# Patient Record
Sex: Female | Born: 1935 | Race: White | Hispanic: No | Smoking: Never smoker
Health system: Southern US, Community
[De-identification: ages and names within clinical notes are randomized; demographics above are authoritative.]

## PROBLEM LIST (undated history)

## (undated) DIAGNOSIS — I10 Essential (primary) hypertension: Secondary | ICD-10-CM

## (undated) DIAGNOSIS — K219 Gastro-esophageal reflux disease without esophagitis: Secondary | ICD-10-CM

## (undated) HISTORY — PX: APPENDECTOMY: SHX54

## (undated) HISTORY — DX: Gastro-esophageal reflux disease without esophagitis: K21.9

## (undated) HISTORY — PX: TONSILLECTOMY: SUR1361

## (undated) HISTORY — DX: Essential (primary) hypertension: I10

## (undated) HISTORY — PX: VAGINAL HYSTERECTOMY: SUR661

---

## 1998-04-06 ENCOUNTER — Ambulatory Visit (HOSPITAL_COMMUNITY): Admission: RE | Admit: 1998-04-06 | Discharge: 1998-04-06 | Payer: Self-pay | Admitting: Family Medicine

## 1999-04-13 ENCOUNTER — Other Ambulatory Visit: Admission: RE | Admit: 1999-04-13 | Discharge: 1999-04-13 | Payer: Self-pay | Admitting: Family Medicine

## 1999-06-07 ENCOUNTER — Other Ambulatory Visit: Admission: RE | Admit: 1999-06-07 | Discharge: 1999-06-07 | Payer: Self-pay | Admitting: Family Medicine

## 1999-09-24 ENCOUNTER — Encounter: Payer: Self-pay | Admitting: Family Medicine

## 1999-09-24 ENCOUNTER — Encounter: Admission: RE | Admit: 1999-09-24 | Discharge: 1999-09-24 | Payer: Self-pay | Admitting: Family Medicine

## 2000-02-14 ENCOUNTER — Encounter: Payer: Self-pay | Admitting: Family Medicine

## 2000-02-14 ENCOUNTER — Ambulatory Visit (HOSPITAL_COMMUNITY): Admission: RE | Admit: 2000-02-14 | Discharge: 2000-02-14 | Payer: Self-pay | Admitting: Family Medicine

## 2000-08-12 HISTORY — PX: OTHER SURGICAL HISTORY: SHX169

## 2000-09-25 ENCOUNTER — Encounter: Payer: Self-pay | Admitting: Family Medicine

## 2000-09-25 ENCOUNTER — Encounter: Admission: RE | Admit: 2000-09-25 | Discharge: 2000-09-25 | Payer: Self-pay | Admitting: Family Medicine

## 2001-07-14 ENCOUNTER — Ambulatory Visit (HOSPITAL_BASED_OUTPATIENT_CLINIC_OR_DEPARTMENT_OTHER): Admission: RE | Admit: 2001-07-14 | Discharge: 2001-07-14 | Payer: Self-pay | Admitting: Orthopedic Surgery

## 2001-10-05 ENCOUNTER — Encounter: Payer: Self-pay | Admitting: Family Medicine

## 2001-10-05 ENCOUNTER — Encounter: Admission: RE | Admit: 2001-10-05 | Discharge: 2001-10-05 | Payer: Self-pay | Admitting: Family Medicine

## 2002-06-03 ENCOUNTER — Other Ambulatory Visit: Admission: RE | Admit: 2002-06-03 | Discharge: 2002-06-03 | Payer: Self-pay | Admitting: Family Medicine

## 2002-07-19 ENCOUNTER — Encounter: Payer: Self-pay | Admitting: Orthopedic Surgery

## 2002-07-19 ENCOUNTER — Encounter: Admission: RE | Admit: 2002-07-19 | Discharge: 2002-07-19 | Payer: Self-pay | Admitting: Orthopedic Surgery

## 2002-07-20 ENCOUNTER — Ambulatory Visit (HOSPITAL_BASED_OUTPATIENT_CLINIC_OR_DEPARTMENT_OTHER): Admission: RE | Admit: 2002-07-20 | Discharge: 2002-07-20 | Payer: Self-pay | Admitting: Orthopedic Surgery

## 2002-09-29 ENCOUNTER — Ambulatory Visit (HOSPITAL_COMMUNITY): Admission: RE | Admit: 2002-09-29 | Discharge: 2002-09-29 | Payer: Self-pay | Admitting: Ophthalmology

## 2002-10-13 ENCOUNTER — Ambulatory Visit (HOSPITAL_COMMUNITY): Admission: RE | Admit: 2002-10-13 | Discharge: 2002-10-13 | Payer: Self-pay | Admitting: Ophthalmology

## 2003-03-30 ENCOUNTER — Encounter: Admission: RE | Admit: 2003-03-30 | Discharge: 2003-03-30 | Payer: Self-pay | Admitting: Family Medicine

## 2003-03-30 ENCOUNTER — Encounter: Payer: Self-pay | Admitting: Family Medicine

## 2004-05-24 ENCOUNTER — Ambulatory Visit (HOSPITAL_COMMUNITY): Admission: RE | Admit: 2004-05-24 | Discharge: 2004-05-24 | Payer: Self-pay | Admitting: Family Medicine

## 2005-04-01 ENCOUNTER — Encounter: Admission: RE | Admit: 2005-04-01 | Discharge: 2005-04-01 | Payer: Self-pay | Admitting: Orthopedic Surgery

## 2005-04-02 ENCOUNTER — Ambulatory Visit (HOSPITAL_COMMUNITY): Admission: RE | Admit: 2005-04-02 | Discharge: 2005-04-02 | Payer: Self-pay | Admitting: Orthopedic Surgery

## 2005-04-02 ENCOUNTER — Ambulatory Visit (HOSPITAL_BASED_OUTPATIENT_CLINIC_OR_DEPARTMENT_OTHER): Admission: RE | Admit: 2005-04-02 | Discharge: 2005-04-02 | Payer: Self-pay | Admitting: Orthopedic Surgery

## 2005-07-25 ENCOUNTER — Ambulatory Visit (HOSPITAL_COMMUNITY): Admission: RE | Admit: 2005-07-25 | Discharge: 2005-07-25 | Payer: Self-pay | Admitting: Family Medicine

## 2006-09-05 ENCOUNTER — Ambulatory Visit (HOSPITAL_COMMUNITY): Admission: RE | Admit: 2006-09-05 | Discharge: 2006-09-05 | Payer: Self-pay | Admitting: Family Medicine

## 2007-10-01 ENCOUNTER — Ambulatory Visit (HOSPITAL_COMMUNITY): Admission: RE | Admit: 2007-10-01 | Discharge: 2007-10-01 | Payer: Self-pay | Admitting: Family Medicine

## 2009-03-22 ENCOUNTER — Encounter: Admission: RE | Admit: 2009-03-22 | Discharge: 2009-03-22 | Payer: Self-pay | Admitting: Family Medicine

## 2010-12-28 NOTE — Op Note (Signed)
NAMEPRESTON, Michelle Jones                ACCOUNT NO.:  [id number]   MEDICAL RECORD NO.:  [id number]          PATIENT TYPE:  AMB   LOCATION:  DSC                          FACILITY:  [facility]   PHYSICIAN:  Leonides Grills, M.D.     DATE OF BIRTH:  1930/01/05   DATE OF PROCEDURE:  04/02/2005  DATE OF DISCHARGE:                                 OPERATIVE REPORT   PREOPERATIVE DIAGNOSES:  1.  Right second metatarsalgia.  2.  Right second hammertoe.   POSTOPERATIVE DIAGNOSES:  1.  Right second metatarsalgia.  2.  Right second hammertoe.   OPERATION:  1.  Right second Weil metatarsal shortening osteotomy.  2.  Stress x-ray right foot.  3.  Right second toe MTP joint dorsal capsulotomies with collateral release.  4.  Right second toe proximal phalanx head resection.  5.  Right second toe EDB to EDL tendon transfer.   ANESTHESIA:  General.   SURGEON:  Leonides Grills, M.D.   ASSISTANT:  Lianne Cure, P.A.   ESTIMATED BLOOD LOSS:  Minimal.   TOURNIQUET TIME:  Approximately 40 minutes.   COMPLICATIONS:  None.   DISPOSITION:  Stable to PR.   INDICATION:  This is a 77 year old pleasant female who has had longstanding  right forefoot pain that was interfering with her life to the point she  could not do what she wanted to do.  She was consented for the above  procedure.  All risks which include infection, nerve or vessel injury,  nonunion, malunion, hard tissue, heart failure, cockup toe deformity,  persistent pain, worsening pain, prolonged recovery, all were explained.  Questions were encouraged and answered.   DESCRIPTION OF PROCEDURE:  The patient was brought to the operating room and  placed in supine position after adequate general endotracheal anesthesia was  administered as well as Ancef 1 g IV piggyback.  The right lower extremity  was then prepped and draped in sterile manner over a proximally placed thigh  tourniquet.  Limb was gravity-exsanguinated and tourniquet elevated to  290  mmHg.  A longitudinal incision over the dorsal aspect of the right second  toe was then made.  Dissection was carried down through skin.  Hemostasis  was obtained.  Extensor digitorum longus and extensor digitorum brevis  tendons were identified, and the longus was tenotomized proximal medial and  right brevis distal lateral and retracted out of harms way for later  transfer.  The MTP joint was then dissected out, and with a 15 blade scalpel  with an assistant retracting the neurovascular structures on either side.  The capsule was then released dorsally as well as collaterals with 15 blade  scalpel.  This side actually released a tight capsule.  We then skeletonized  the distal aspect of the proximal phalanx.  Head was then dissected out, and  the head was then removed with a rongeur followed by a bone cutter.  It was  found at this point that we did not require an FDL to proximal phalanx  transfer.  We then went back to the MTP joint and plantar-flexed the toe and  with stacked saw blades performed the Weil osteotomy parallel to the plantar  aspect of the foot, protecting the soft tissue both medially and laterally.  Once this was done, head was translated approximately 3-4 mm proximally, and  then fixed with a 12-mm Synthes Mini Frag screw using 2.5, 1.5-mm drill  holes respectively.  This had excellent purchase.  Stress x-rays were obtain  in the AP and lateral plane and showed excellent __________ as well as no  gross motion across the osteotomy site.  We then copiously irrigated the  area and joints with normal saline.  We then back to the PIP joint and  placed a 0.045 K-wire antegrade across the middle and distal phalanx,  reduced the PIP joint and then fired this retrograde into the proximal  phalanx.  MTP joint was then held into proper position, and the K-wire was  then advanced across the MTP joint into the metatarsal head.  This had  excellent maintenance of the corrected  position, and did not change the  position of the Weil osteotomy.  The joint and area were copiously irrigated  with normal saline.  We then performed an EDB to EDL tendon transfer with 3-  0 PDS suture. This had an outstanding transfer and its proper tension as  well.  Tourniquet was deflated.  Hemostasis was obtained.  Skin-relieving  incisions were made on either side of the K-wire.  K-wire was bent, cut, and  capped.  Toe picked up nicely.  Wound was closed with 4-0 nylon suture.  Sterile dressing was applied.  Hard sole shoe was applied.  The patient was  stable to the PR.      Leonides Grills, M.D.  Electronically Signed     PB/MEDQ  D:  04/02/2005  T:  04/02/2005  Job:  [id number]

## 2010-12-28 NOTE — Op Note (Signed)
[facility]  Patient:    Michelle Jones, Michelle Jones Visit Number: [id number] MRN: [id number]          Service Type: Attending:  Katy Fitch. Naaman Plummer., M.D. Dictated by:   Katy Fitch Naaman Plummer., M.D. Proc. Date: 07/14/01                             Operative Report  PREOPERATIVE DIAGNOSIS: 1. Bilateral carpal tunnel syndrome. 2. Chronic stenosing tenosynovitis, left first dorsal compartment,    unresponsive to splinting, injection and activity modification.  POSTOPERATIVE DIAGNOSIS: 1. Bilateral carpal tunnel syndrome. 2. Chronic stenosing tenosynovitis, left first dorsal compartment,    unresponsive to splinting, injection and activity modification.  OPERATION PERFORMED: 1. Release of left transverse carpal ligament. 2. Through separate incision release of left first dorsal compartment    and tenosynovectomy of abductor pollicis longus tendons.  OPERATING SURGEON:  Josephine Igo, M.D.  ASSISTANT:  Annye Rusk, P.A-C.  ANESTHESIA:  General by LMA.  SUPERVISING ANESTHESIOLOGIST:  Dr. Zoila Shutter.  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  She did not respond to steroid injection and splinting for the first dorsal compartment and had chronic numbness in a median distribution bilaterally. She was referred to see Dr. Wadie Lessen for detailed electrocautery diagnostic studies on July 13, 2001 and was noted to have rather severe bilateral carpal tunnel syndrome.  After informed consent she requested that we try to care for both hands prior to the year end.  She is brought to the operating room at this time for release of her left transverse carpal ligament and release of her left first dorsal compartment.  DESCRIPTION OF PROCEDURE:   Michelle Jones was brought to the operating room and placed in supine position on the operating table.  Following induction of general anesthesia by LMA, the left arm was prepped with Betadine soap and solution and sterilely draped.  Following exsanguination of the limb with an Esmarch bandage, the arterial tourniquet was inflated to 220 mHg.  The procedure commenced with a short incision in line of the ring finger in the palm.  The subcutaneous tissues were carefully divided revealing the palmar fascia.  This was split longitudinally to reveal the common sensory branch of the median nerve.  These were followed back to the transverse carpal ligament which was carefully separated from the median nerve.  The ligament was released on its ulnar border extending into the distal forearm.  This widely opened the carpal canal.  No masses or other predicaments were noted.  Bleeding points along the margin of the released ligament were electrocauterized with bipolar current followed by repair of the skin with intradermal 3-0 Prolene suture.  Attention was then directed to the first dorsal compartment.  A short transverse incision was fashioned directly over the palpably enlarged compartment.  Subcutaneous tissues were carefully divided taking care to clear inflammatory tissues off the compartment and gently elevate the radial sensory branches and maintain them in a safe position with the use of blunt right angle retractors.  The first dorsal compartment was split with a scalpel and extended proximally and distally with scissors.  There was noticed to be two slips of the abductor pollicis longus invested in a very inflamed tenosynovium.  The extensor pollicis brevis  had a very small distal compartment that was released.  It did not have significant tenosynovitis.  A small rongeur was used to clear the tenosynovium off of the abductor pollicis longus slips.  Thereafter, free range of motion of the  thumb metacarpal at the [facility] joint and wrists was covered in radial and ulnar deviation.  The wound was inspected for bleeding points and subsequently repaired with intradermal 3-0 Prolene.  Steri-Strips were applied to the wounds followed by compressive dressing with a volar splint maintaining the wrist in 5 degrees dorsiflexion.  For aftercare Ms. Crouse is given a prescription for Percocet 5 mg one to two tablets p.o. q.4-6h. p.r.n. pain.  She will return to our office in seven to 10 days for dressing change and initiation of her therapy program. Dictated by:   Katy Fitch. Naaman Plummer., M.D. Attending:  Katy Fitch. Naaman Plummer., M.D. DD:  07/14/01 TD:  07/14/01 Job: [id number]

## 2010-12-28 NOTE — Op Note (Signed)
NAMEBABE, Michelle Jones                          ACCOUNT NO.:  [id number]   MEDICAL RECORD NO.:  [id number]                   PATIENT TYPE:  AMB   LOCATION:  DSC                                  FACILITY:  [facility]   PHYSICIAN:  Katy Fitch. Naaman Plummer., M.D.          DATE OF BIRTH:  1930-05-16   DATE OF PROCEDURE:  07/20/2002  DATE OF DISCHARGE:                                 OPERATIVE REPORT   PREOPERATIVE DIAGNOSES:  1. Chronic entrapment neuropathy, right median nerve at carpal tunnel.  2. Stenosing tenosynovitis, right thumb.  3. Stenosing tenosynovitis, right ring finger.   PROCEDURES:  1. Release of right transverse carpal ligament, 64721-RT.  2. Release of right thumb A1 pulley, [id number].  3. Release of right ring finger A1 pulley, [phone].   SURGEON:  Katy Fitch. Sypher, M.D.   ASSISTANT:  Jonni Sanger, P.A.   ANESTHESIA:  General by LMA supervised by the anesthesiologist, Janetta Hora.  Gelene Mink, M.D.   INDICATIONS:  The patient is a 77 year old well-known patient who has had a  history of bilateral carpal tunnel syndrome and stenosing tenosynovitis.  She is status post left carpal tunnel release and is quite satisfied with  the results.   She returned with problems including triggering of her thumb and ring  finger.   Due to a failure to respond to nonoperative measures, she is brought to the  operating room at this time.   DESCRIPTION OF PROCEDURE:  The patient is brought to the operating room and  placed in the supine position upon the operating table.   Following induction of anesthesia by LMA, the right arm is prepped with  Betadine soap and solution and sterilely draped.   Following exsanguination of the limb with an Esmarch bandage, an arterial  tourniquet on the proximal brachium is inflated to 220 mmHg.   The procedure commenced with a short incision in the line of the ring finger  in the palm.  Subcutaneous tissues were carefully divided to  reveal the  palmar fascia.  This was split longitudinally to reveal the common sensory  branch of the median nerve.   These were followed back to the transverse carpal ligament, which was  carefully isolated from the median nerve.   The ligament was released along its ulnar border, extending into the distal  forearm.   This widely opened the carpal canal.   No masses or other predicaments were noted.   Bleeding points along the margin of the released ligament were  electrocauterized with bipolar current, followed by repair of the skin with  intradermal 3-0 Prolene suture.   Attention then directed to the thumb.   The A1 pulley was exposed through a short transverse incision.  Subcutaneous  tissues were carefully divided with scissors, taking care to gently retract  the radial proper digital nerve with a blunt retractor.  A Glorious Peach was used to  clear inflammatory tissues off  the A1 pulley.  Once the pulley was isolated,  it was split with a scalpel and scissors.  The flexor pollicis longus tendon  was delivered and found to have one small area of necrosis.  This was  debrided with a rongeur.  The tendon was replaced in the sheath and the  wound repaired with intradermal 3-0 Prolene suture.   Thereafter a full range of motion of the IP joint was recovered passively.   This wound as then repaired with intradermal 3-0 Prolene suture.   A third incision was then fashioned over the A1 pulley of the ring finger  distal to the distal palmar crease.  Subcutaneous tissues were carefully to  divided to reveal the palmar fascia.  The pretendinous fibers were released  with scissors, followed by isolation of the A1 pulley.  The pulley was split  with scissors and the flexor tendons delivered.  A nodule of tenosynovium on  the superficialis tendon was removed with a rongeur.  Thereafter free range  of motion of the finger was recovered.   The tendons were replaced in the retinacular sheath,  followed by repair of  the wound with intradermal 3-0 Prolene suture.   The wounds were then infiltrated with 0.25% Marcaine for postoperative  analgesia, followed by dressing with Xeroflo, sterile gauze, and sterile  Webril, followed by a volar plaster splint maintaining the wrist in 10  degrees of dorsiflexion.                                               Katy Fitch Naaman Plummer., M.D.    RVS/MEDQ  D:  07/20/2002  T:  07/20/2002  Job:  [id number]

## 2011-08-20 DIAGNOSIS — H04129 Dry eye syndrome of unspecified lacrimal gland: Secondary | ICD-10-CM | POA: Diagnosis not present

## 2011-08-20 DIAGNOSIS — Z961 Presence of intraocular lens: Secondary | ICD-10-CM | POA: Diagnosis not present

## 2012-01-21 DIAGNOSIS — I10 Essential (primary) hypertension: Secondary | ICD-10-CM | POA: Diagnosis not present

## 2012-01-21 DIAGNOSIS — F3289 Other specified depressive episodes: Secondary | ICD-10-CM | POA: Diagnosis not present

## 2012-01-21 DIAGNOSIS — F329 Major depressive disorder, single episode, unspecified: Secondary | ICD-10-CM | POA: Diagnosis not present

## 2012-01-21 DIAGNOSIS — Z780 Asymptomatic menopausal state: Secondary | ICD-10-CM | POA: Diagnosis not present

## 2012-01-21 DIAGNOSIS — E039 Hypothyroidism, unspecified: Secondary | ICD-10-CM | POA: Diagnosis not present

## 2012-01-21 DIAGNOSIS — K219 Gastro-esophageal reflux disease without esophagitis: Secondary | ICD-10-CM | POA: Diagnosis not present

## 2012-01-21 DIAGNOSIS — E785 Hyperlipidemia, unspecified: Secondary | ICD-10-CM | POA: Diagnosis not present

## 2012-01-21 DIAGNOSIS — M1990 Unspecified osteoarthritis, unspecified site: Secondary | ICD-10-CM | POA: Diagnosis not present

## 2012-02-06 DIAGNOSIS — Z803 Family history of malignant neoplasm of breast: Secondary | ICD-10-CM | POA: Diagnosis not present

## 2012-02-06 DIAGNOSIS — Z1231 Encounter for screening mammogram for malignant neoplasm of breast: Secondary | ICD-10-CM | POA: Diagnosis not present

## 2012-07-23 DIAGNOSIS — F3289 Other specified depressive episodes: Secondary | ICD-10-CM | POA: Diagnosis not present

## 2012-07-23 DIAGNOSIS — N183 Chronic kidney disease, stage 3 unspecified: Secondary | ICD-10-CM | POA: Diagnosis not present

## 2012-07-23 DIAGNOSIS — I10 Essential (primary) hypertension: Secondary | ICD-10-CM | POA: Diagnosis not present

## 2012-07-23 DIAGNOSIS — E039 Hypothyroidism, unspecified: Secondary | ICD-10-CM | POA: Diagnosis not present

## 2012-07-23 DIAGNOSIS — N951 Menopausal and female climacteric states: Secondary | ICD-10-CM | POA: Diagnosis not present

## 2012-07-23 DIAGNOSIS — E785 Hyperlipidemia, unspecified: Secondary | ICD-10-CM | POA: Diagnosis not present

## 2012-07-23 DIAGNOSIS — F329 Major depressive disorder, single episode, unspecified: Secondary | ICD-10-CM | POA: Diagnosis not present

## 2012-08-28 DIAGNOSIS — H18419 Arcus senilis, unspecified eye: Secondary | ICD-10-CM | POA: Diagnosis not present

## 2012-08-28 DIAGNOSIS — H26499 Other secondary cataract, unspecified eye: Secondary | ICD-10-CM | POA: Diagnosis not present

## 2012-08-28 DIAGNOSIS — I10 Essential (primary) hypertension: Secondary | ICD-10-CM | POA: Diagnosis not present

## 2012-08-28 DIAGNOSIS — H04129 Dry eye syndrome of unspecified lacrimal gland: Secondary | ICD-10-CM | POA: Diagnosis not present

## 2012-08-28 DIAGNOSIS — Z961 Presence of intraocular lens: Secondary | ICD-10-CM | POA: Diagnosis not present

## 2012-10-14 DIAGNOSIS — M5430 Sciatica, unspecified side: Secondary | ICD-10-CM | POA: Diagnosis not present

## 2012-10-14 DIAGNOSIS — M25569 Pain in unspecified knee: Secondary | ICD-10-CM | POA: Diagnosis not present

## 2012-10-28 DIAGNOSIS — M5430 Sciatica, unspecified side: Secondary | ICD-10-CM | POA: Diagnosis not present

## 2012-10-28 DIAGNOSIS — IMO0002 Reserved for concepts with insufficient information to code with codable children: Secondary | ICD-10-CM | POA: Diagnosis not present

## 2012-11-25 DIAGNOSIS — N951 Menopausal and female climacteric states: Secondary | ICD-10-CM | POA: Diagnosis not present

## 2012-11-30 DIAGNOSIS — M5430 Sciatica, unspecified side: Secondary | ICD-10-CM | POA: Diagnosis not present

## 2012-11-30 DIAGNOSIS — IMO0002 Reserved for concepts with insufficient information to code with codable children: Secondary | ICD-10-CM | POA: Diagnosis not present

## 2012-11-30 DIAGNOSIS — M1710 Unilateral primary osteoarthritis, unspecified knee: Secondary | ICD-10-CM | POA: Diagnosis not present

## 2013-01-20 DIAGNOSIS — Z0000 Encounter for general adult medical examination without abnormal findings: Secondary | ICD-10-CM | POA: Diagnosis not present

## 2013-01-20 DIAGNOSIS — I10 Essential (primary) hypertension: Secondary | ICD-10-CM | POA: Diagnosis not present

## 2013-01-20 DIAGNOSIS — K219 Gastro-esophageal reflux disease without esophagitis: Secondary | ICD-10-CM | POA: Diagnosis not present

## 2013-01-20 DIAGNOSIS — E039 Hypothyroidism, unspecified: Secondary | ICD-10-CM | POA: Diagnosis not present

## 2013-01-20 DIAGNOSIS — E785 Hyperlipidemia, unspecified: Secondary | ICD-10-CM | POA: Diagnosis not present

## 2013-01-20 DIAGNOSIS — F3289 Other specified depressive episodes: Secondary | ICD-10-CM | POA: Diagnosis not present

## 2013-01-20 DIAGNOSIS — Z780 Asymptomatic menopausal state: Secondary | ICD-10-CM | POA: Diagnosis not present

## 2013-01-20 DIAGNOSIS — F329 Major depressive disorder, single episode, unspecified: Secondary | ICD-10-CM | POA: Diagnosis not present

## 2013-03-04 DIAGNOSIS — J209 Acute bronchitis, unspecified: Secondary | ICD-10-CM | POA: Diagnosis not present

## 2013-03-29 DIAGNOSIS — R609 Edema, unspecified: Secondary | ICD-10-CM | POA: Diagnosis not present

## 2013-04-20 DIAGNOSIS — R609 Edema, unspecified: Secondary | ICD-10-CM | POA: Diagnosis not present

## 2013-06-18 DIAGNOSIS — Z1231 Encounter for screening mammogram for malignant neoplasm of breast: Secondary | ICD-10-CM | POA: Diagnosis not present

## 2013-07-22 DIAGNOSIS — K219 Gastro-esophageal reflux disease without esophagitis: Secondary | ICD-10-CM | POA: Diagnosis not present

## 2013-07-22 DIAGNOSIS — E785 Hyperlipidemia, unspecified: Secondary | ICD-10-CM | POA: Diagnosis not present

## 2013-07-22 DIAGNOSIS — I10 Essential (primary) hypertension: Secondary | ICD-10-CM | POA: Diagnosis not present

## 2013-07-22 DIAGNOSIS — R609 Edema, unspecified: Secondary | ICD-10-CM | POA: Diagnosis not present

## 2013-07-22 DIAGNOSIS — IMO0002 Reserved for concepts with insufficient information to code with codable children: Secondary | ICD-10-CM | POA: Diagnosis not present

## 2013-08-18 DIAGNOSIS — R109 Unspecified abdominal pain: Secondary | ICD-10-CM | POA: Diagnosis not present

## 2013-09-17 DIAGNOSIS — H26499 Other secondary cataract, unspecified eye: Secondary | ICD-10-CM | POA: Diagnosis not present

## 2013-09-17 DIAGNOSIS — Z961 Presence of intraocular lens: Secondary | ICD-10-CM | POA: Diagnosis not present

## 2013-09-17 DIAGNOSIS — H02839 Dermatochalasis of unspecified eye, unspecified eyelid: Secondary | ICD-10-CM | POA: Diagnosis not present

## 2013-09-17 DIAGNOSIS — H18419 Arcus senilis, unspecified eye: Secondary | ICD-10-CM | POA: Diagnosis not present

## 2014-01-24 DIAGNOSIS — Z0000 Encounter for general adult medical examination without abnormal findings: Secondary | ICD-10-CM | POA: Diagnosis not present

## 2014-01-24 DIAGNOSIS — M79609 Pain in unspecified limb: Secondary | ICD-10-CM | POA: Diagnosis not present

## 2014-01-24 DIAGNOSIS — IMO0002 Reserved for concepts with insufficient information to code with codable children: Secondary | ICD-10-CM | POA: Diagnosis not present

## 2014-01-24 DIAGNOSIS — I10 Essential (primary) hypertension: Secondary | ICD-10-CM | POA: Diagnosis not present

## 2014-01-24 DIAGNOSIS — K219 Gastro-esophageal reflux disease without esophagitis: Secondary | ICD-10-CM | POA: Diagnosis not present

## 2014-01-24 DIAGNOSIS — R609 Edema, unspecified: Secondary | ICD-10-CM | POA: Diagnosis not present

## 2014-01-24 DIAGNOSIS — E785 Hyperlipidemia, unspecified: Secondary | ICD-10-CM | POA: Diagnosis not present

## 2014-07-27 DIAGNOSIS — R609 Edema, unspecified: Secondary | ICD-10-CM | POA: Diagnosis not present

## 2014-07-27 DIAGNOSIS — F325 Major depressive disorder, single episode, in full remission: Secondary | ICD-10-CM | POA: Diagnosis not present

## 2014-07-27 DIAGNOSIS — N183 Chronic kidney disease, stage 3 (moderate): Secondary | ICD-10-CM | POA: Diagnosis not present

## 2014-07-27 DIAGNOSIS — G250 Essential tremor: Secondary | ICD-10-CM | POA: Diagnosis not present

## 2014-07-27 DIAGNOSIS — I129 Hypertensive chronic kidney disease with stage 1 through stage 4 chronic kidney disease, or unspecified chronic kidney disease: Secondary | ICD-10-CM | POA: Diagnosis not present

## 2014-07-27 DIAGNOSIS — K219 Gastro-esophageal reflux disease without esophagitis: Secondary | ICD-10-CM | POA: Diagnosis not present

## 2014-07-27 DIAGNOSIS — Z23 Encounter for immunization: Secondary | ICD-10-CM | POA: Diagnosis not present

## 2014-07-27 DIAGNOSIS — E039 Hypothyroidism, unspecified: Secondary | ICD-10-CM | POA: Diagnosis not present

## 2014-07-27 DIAGNOSIS — E782 Mixed hyperlipidemia: Secondary | ICD-10-CM | POA: Diagnosis not present

## 2014-08-18 DIAGNOSIS — M79609 Pain in unspecified limb: Secondary | ICD-10-CM | POA: Diagnosis not present

## 2014-09-14 ENCOUNTER — Other Ambulatory Visit: Payer: Self-pay | Admitting: Family Medicine

## 2014-09-14 ENCOUNTER — Ambulatory Visit
Admission: RE | Admit: 2014-09-14 | Discharge: 2014-09-14 | Disposition: A | Discharge status: Home / Self Care | Payer: Medicare Other | Source: Ambulatory Visit | Attending: Family Medicine | Admitting: Family Medicine

## 2014-09-14 DIAGNOSIS — M25552 Pain in left hip: Secondary | ICD-10-CM | POA: Diagnosis not present

## 2014-09-14 DIAGNOSIS — L03115 Cellulitis of right lower limb: Secondary | ICD-10-CM | POA: Diagnosis not present

## 2014-09-14 DIAGNOSIS — S79912A Unspecified injury of left hip, initial encounter: Secondary | ICD-10-CM | POA: Diagnosis not present

## 2014-09-20 ENCOUNTER — Other Ambulatory Visit: Payer: Self-pay | Admitting: Family Medicine

## 2014-09-20 DIAGNOSIS — S72002A Fracture of unspecified part of neck of left femur, initial encounter for closed fracture: Secondary | ICD-10-CM

## 2014-09-22 ENCOUNTER — Ambulatory Visit
Admission: RE | Admit: 2014-09-22 | Discharge: 2014-09-22 | Disposition: A | Discharge status: Home / Self Care | Payer: Medicare Other | Source: Ambulatory Visit | Attending: Family Medicine | Admitting: Family Medicine

## 2014-09-22 DIAGNOSIS — S76012A Strain of muscle, fascia and tendon of left hip, initial encounter: Secondary | ICD-10-CM | POA: Diagnosis not present

## 2014-09-22 DIAGNOSIS — S72002A Fracture of unspecified part of neck of left femur, initial encounter for closed fracture: Secondary | ICD-10-CM

## 2014-09-22 DIAGNOSIS — M7062 Trochanteric bursitis, left hip: Secondary | ICD-10-CM | POA: Diagnosis not present

## 2014-10-03 ENCOUNTER — Other Ambulatory Visit: Payer: Self-pay | Admitting: Family Medicine

## 2014-10-03 DIAGNOSIS — R1900 Intra-abdominal and pelvic swelling, mass and lump, unspecified site: Secondary | ICD-10-CM

## 2014-10-04 DIAGNOSIS — H04123 Dry eye syndrome of bilateral lacrimal glands: Secondary | ICD-10-CM | POA: Diagnosis not present

## 2014-10-04 DIAGNOSIS — Z961 Presence of intraocular lens: Secondary | ICD-10-CM | POA: Diagnosis not present

## 2014-10-04 DIAGNOSIS — H18413 Arcus senilis, bilateral: Secondary | ICD-10-CM | POA: Diagnosis not present

## 2014-10-07 ENCOUNTER — Ambulatory Visit
Admission: RE | Admit: 2014-10-07 | Discharge: 2014-10-07 | Disposition: A | Discharge status: Home / Self Care | Payer: Medicare Other | Source: Ambulatory Visit | Attending: Family Medicine | Admitting: Family Medicine

## 2014-10-07 DIAGNOSIS — R1900 Intra-abdominal and pelvic swelling, mass and lump, unspecified site: Secondary | ICD-10-CM

## 2014-10-07 DIAGNOSIS — N9489 Other specified conditions associated with female genital organs and menstrual cycle: Secondary | ICD-10-CM | POA: Diagnosis not present

## 2014-10-07 MED ORDER — IOHEXOL 300 MG/ML  SOLN
100.0000 mL | Freq: Once | INTRAMUSCULAR | Status: AC | PRN
  Administered 2014-10-07: 100 mL via INTRAVENOUS

## 2014-10-17 DIAGNOSIS — R1900 Intra-abdominal and pelvic swelling, mass and lump, unspecified site: Secondary | ICD-10-CM | POA: Diagnosis not present

## 2014-10-24 ENCOUNTER — Ambulatory Visit: Payer: Medicare Other | Attending: Gynecologic Oncology | Admitting: Gynecologic Oncology

## 2014-10-24 ENCOUNTER — Other Ambulatory Visit: Payer: Medicare Other

## 2014-10-24 ENCOUNTER — Encounter: Payer: Self-pay | Admitting: Gynecologic Oncology

## 2014-10-24 VITALS — BP 114/46 | HR 73 | Temp 98.1°F | Resp 16 | Ht 63.0 in | Wt 165.1 lb

## 2014-10-24 DIAGNOSIS — Z880 Allergy status to penicillin: Secondary | ICD-10-CM | POA: Diagnosis not present

## 2014-10-24 DIAGNOSIS — Z833 Family history of diabetes mellitus: Secondary | ICD-10-CM | POA: Diagnosis not present

## 2014-10-24 DIAGNOSIS — R1909 Other intra-abdominal and pelvic swelling, mass and lump: Secondary | ICD-10-CM

## 2014-10-24 DIAGNOSIS — K219 Gastro-esophageal reflux disease without esophagitis: Secondary | ICD-10-CM | POA: Diagnosis not present

## 2014-10-24 DIAGNOSIS — Z90710 Acquired absence of both cervix and uterus: Secondary | ICD-10-CM | POA: Diagnosis not present

## 2014-10-24 DIAGNOSIS — N83209 Unspecified ovarian cyst, unspecified side: Secondary | ICD-10-CM

## 2014-10-24 DIAGNOSIS — Z803 Family history of malignant neoplasm of breast: Secondary | ICD-10-CM | POA: Diagnosis not present

## 2014-10-24 DIAGNOSIS — Z808 Family history of malignant neoplasm of other organs or systems: Secondary | ICD-10-CM | POA: Diagnosis not present

## 2014-10-24 DIAGNOSIS — I10 Essential (primary) hypertension: Secondary | ICD-10-CM | POA: Insufficient documentation

## 2014-10-24 DIAGNOSIS — R1900 Intra-abdominal and pelvic swelling, mass and lump, unspecified site: Secondary | ICD-10-CM | POA: Insufficient documentation

## 2014-10-24 DIAGNOSIS — Z79899 Other long term (current) drug therapy: Secondary | ICD-10-CM | POA: Insufficient documentation

## 2014-10-24 NOTE — Progress Notes (Signed)
Consult Note: Gyn-Onc  Consult was requested by Dr. Landry Mellow for the evaluation of Michelle Jones 81 y.o. female with a cystic pelvic mass at vaginal cuff seen on imaging.  CC:  Chief Complaint  Patient presents with  . pelvic mass    Assessment/Plan:  Michelle Jones  is a 81 y.o.  year old with what is most likely a benign pelvic mass s/p remote hx of hysterectomy and possible BSO.  I performed a history, physical examination, and personally reviewed the patient's imaging films including the CT and MRI from February, 2016.   I informed the patient and her daughter that I have a low suspicion that this cystic mass is cancer or continuing to her symptoms of hip pain. We will draw a CA-125 today and if low this is very reassuring. I discussed the low specificity of CA-125 and the fact that if it is elevated does not mean that this cyst is malignant. However we would be more inclined to pursue surgical excision.  I offer the patient to options for therapy. Option #1 is to pursue laparoscopic exploration possible BSO (if she has remaining ovaries and tubes in place), frozen section of the mass and staging for malignancy is found. Option #2 is to enter surveillance of the mass with transvaginal ultrasounds at 3 and 6 months followed by spacing of exams if stability is noted. I'm offering this conservative approach this patient as she is 81 years of age, the imaging is not particularly worrisome for malignancy, and these are not symptomatic. I discussed operative risks with the patient including  bleeding, infection, damage to internal organs (such as bladder,ureters, bowels), blood clot, reoperation and rehospitalization. I discussed that obesity and advanced age increase her risk for a complication postoperatively.  The patient is electing for the conservative approach of interval ultrasounds. We have scheduled an ultrasound in 3 months time to evaluate character 6 of the cyst and monitor for  stability.   HPI: Michelle Jones is a 81 year old woman who had the detection of a 6 cm cystic midline pelvic mass on MRI and CT scan performed for left hip pain that commenced approximately 2 months ago after a fall. She has a remote history of a vaginal hysterectomy for abnormal uterine bleeding when she was in her 42s. She was placed on Premarin orally postoperatively. She does not know definitively if ovaries were taken at that surgery.  On 02/20/2015 she underwent an MRI of the hip without contrast to evaluate the left hip and groin for pain following a fall injuring her 2016. The MRI detected a cystic mass in the anatomic pelvis that measured approximate 5 cm. Other abnormalities on the MRI included a partial tear of the left gluteus minimus tendon, left trochanteric bursitis, and the left hip osteoarthritis. To follow-up the MRI she underwent a CT scan of the abdomen and pelvis on 10/07/2014 which showed no evidence of metastatic disease including no ascites, no lymphadenopathy, no carcinomatosis. However did detect a 6.8 x 4.2 cm septated cystic mass within the cul-de-sac of the pelvis. No definitive ovaries were identified.  Interval History: she continues to have left hip discomfort since her fall.  Current Meds:  Outpatient Encounter Prescriptions as of 10/24/2014  Medication Sig  . amLODipine (NORVASC) 10 MG tablet Take 10 mg by mouth daily.   Marland Kitchen buPROPion (WELLBUTRIN XL) 300 MG 24 hr tablet Take 300 mg by mouth daily.   . Diphenhydramine-Acetaminophen (PERCOGESIC PO) Take 2 tablets by mouth at  bedtime.  . DULoxetine (CYMBALTA) 60 MG capsule Take 60 mg by mouth daily.   . furosemide (LASIX) 20 MG tablet Take 20 mg by mouth daily.   Marland Kitchen levothyroxine (SYNTHROID, LEVOTHROID) 100 MCG tablet Take 100 mcg by mouth daily before breakfast.   . lisinopril-hydrochlorothiazide (PRINZIDE,ZESTORETIC) 20-25 MG per tablet Take 1 tablet by mouth daily.   . metoprolol succinate (TOPROL-XL) 100 MG 24 hr  tablet Take 100 mg by mouth daily.   . pravastatin (PRAVACHOL) 40 MG tablet Take 40 mg by mouth daily.   . ranitidine (ZANTAC) 150 MG tablet Take 150 mg by mouth daily.   Marland Kitchen ZETIA 10 MG tablet Take 10 mg by mouth daily.     Allergy:  Allergies  Allergen Reactions  . Penicillins     Social Hx:   History   Social History  . Marital Status: Widowed    Spouse Name: N/A  . Number of Children: N/A  . Years of Education: N/A   Occupational History  . Not on file.   Social History Main Topics  . Smoking status: Never Smoker   . Smokeless tobacco: Not on file  . Alcohol Use: No  . Drug Use: No  . Sexual Activity: Not on file   Other Topics Concern  . Not on file   Social History Narrative  . No narrative on file    Past Surgical Hx:  Past Surgical History  Procedure Laterality Date  . Appendectomy    . Abdominal hysterectomy      Past Medical Hx:  Past Medical History  Diagnosis Date  . Hypertension   . GERD (gastroesophageal reflux disease)     Past Gynecological History:  SVD, hysterectomy for abnormal uterine bleeding (likely BSO at time of hysterectomy)  No LMP recorded.  Family Hx:  Family History  Problem Relation Age of Onset  . Melanoma Mother   . Breast cancer Sister   . Diabetes Brother     Review of Systems:  Constitutional  Feels well,    ENT Normal appearing ears and nares bilaterally Skin/Breast  No rash, sores, jaundice, itching, dryness Cardiovascular  No chest pain, shortness of breath, or edema  Pulmonary  No cough or wheeze.  Gastro Intestinal  No nausea, vomitting, or diarrhoea. No bright red blood per rectum, no abdominal pain, change in bowel movement, or constipation.  Genito Urinary  No frequency, urgency, dysuria,  Musculo Skeletal  No myalgia, arthralgia, joint swelling or + pain see HPI Neurologic  No weakness, numbness, change in gait,  Psychology  No depression, anxiety, insomnia.   Vitals:  Blood pressure 114/46,  pulse 73, temperature 98.1 F (36.7 C), temperature source Oral, resp. rate 16, height 5\' 3"  (1.6 m), weight 165 lb 1.6 oz (74.889 kg).  Physical Exam: WD in NAD Neck  Supple NROM, without any enlargements.  Lymph Node Survey No cervical supraclavicular or inguinal adenopathy Cardiovascular  Pulse normal rate, regularity and rhythm. S1 and S2 normal.  Lungs  Clear to auscultation bilateraly, without wheezes/crackles/rhonchi. Good air movement.  Skin  No rash/lesions/breakdown  Psychiatry  Alert and oriented to person, place, and time  Abdomen  Normoactive bowel sounds, abdomen soft, non-tender and obese without evidence of hernia.  Back No CVA tenderness Genito Urinary  Vulva/vagina: Normal external female genitalia.  No lesions. No discharge or bleeding.  Bladder/urethra:  No lesions or masses, well supported bladder  Vagina: normal to palpate  Cervix: surgically absent  Uterus: surgically absent   Adnexa: no  palpable masses. Rectal  Good tone, no masses no cul de sac nodularity.  Extremities  No bilateral cyanosis, clubbing or edema.   Donaciano Eva, MD   10/24/2014, 2:27 PM

## 2014-10-25 ENCOUNTER — Telehealth: Payer: Self-pay | Admitting: *Deleted

## 2014-10-25 LAB — CA 125: CA 125: 13 U/mL (ref <35)

## 2014-10-25 NOTE — Telephone Encounter (Signed)
Unable tot reach pt. LMOVM CA125 was low and this is reassuring. Pt will continue on observation. Please call our office to confirm message was received.

## 2015-01-16 ENCOUNTER — Ambulatory Visit (HOSPITAL_COMMUNITY): Payer: Medicare Other | Attending: Gynecologic Oncology

## 2015-01-16 ENCOUNTER — Ambulatory Visit (HOSPITAL_COMMUNITY): Payer: Medicare Other

## 2015-01-20 ENCOUNTER — Ambulatory Visit: Payer: Medicare Other | Attending: Gynecologic Oncology | Admitting: Gynecologic Oncology

## 2015-01-26 DIAGNOSIS — G250 Essential tremor: Secondary | ICD-10-CM | POA: Diagnosis not present

## 2015-01-26 DIAGNOSIS — E782 Mixed hyperlipidemia: Secondary | ICD-10-CM | POA: Diagnosis not present

## 2015-01-26 DIAGNOSIS — I129 Hypertensive chronic kidney disease with stage 1 through stage 4 chronic kidney disease, or unspecified chronic kidney disease: Secondary | ICD-10-CM | POA: Diagnosis not present

## 2015-01-26 DIAGNOSIS — K219 Gastro-esophageal reflux disease without esophagitis: Secondary | ICD-10-CM | POA: Diagnosis not present

## 2015-01-26 DIAGNOSIS — N183 Chronic kidney disease, stage 3 (moderate): Secondary | ICD-10-CM | POA: Diagnosis not present

## 2015-01-26 DIAGNOSIS — Z0000 Encounter for general adult medical examination without abnormal findings: Secondary | ICD-10-CM | POA: Diagnosis not present

## 2015-01-26 DIAGNOSIS — N951 Menopausal and female climacteric states: Secondary | ICD-10-CM | POA: Diagnosis not present

## 2015-01-26 DIAGNOSIS — N390 Urinary tract infection, site not specified: Secondary | ICD-10-CM | POA: Diagnosis not present

## 2015-01-26 DIAGNOSIS — F325 Major depressive disorder, single episode, in full remission: Secondary | ICD-10-CM | POA: Diagnosis not present

## 2015-01-26 DIAGNOSIS — E039 Hypothyroidism, unspecified: Secondary | ICD-10-CM | POA: Diagnosis not present

## 2015-01-26 DIAGNOSIS — M25511 Pain in right shoulder: Secondary | ICD-10-CM | POA: Diagnosis not present

## 2015-01-26 DIAGNOSIS — R609 Edema, unspecified: Secondary | ICD-10-CM | POA: Diagnosis not present

## 2015-02-06 ENCOUNTER — Encounter: Payer: Self-pay | Admitting: Gynecologic Oncology

## 2015-02-06 ENCOUNTER — Ambulatory Visit: Payer: Medicare Other | Attending: Gynecologic Oncology | Admitting: Gynecologic Oncology

## 2015-02-06 ENCOUNTER — Other Ambulatory Visit (HOSPITAL_BASED_OUTPATIENT_CLINIC_OR_DEPARTMENT_OTHER): Payer: Medicare Other

## 2015-02-06 VITALS — BP 108/43 | HR 78 | Temp 98.2°F | Resp 16 | Ht 63.0 in | Wt 164.5 lb

## 2015-02-06 DIAGNOSIS — R1900 Intra-abdominal and pelvic swelling, mass and lump, unspecified site: Secondary | ICD-10-CM | POA: Diagnosis not present

## 2015-02-06 DIAGNOSIS — R1909 Other intra-abdominal and pelvic swelling, mass and lump: Secondary | ICD-10-CM | POA: Diagnosis not present

## 2015-02-06 NOTE — Progress Notes (Signed)
Followup Note: Gyn-Onc  Consult was initially requested by Dr. Landry Mellow for the evaluation of Michelle Jones 81 y.o. female with a cystic pelvic mass at vaginal cuff seen on imaging.  CC:  Chief Complaint  Patient presents with  . Pelvic Mass    Assessment/Plan:  Michelle Jones  is a 81 y.o.  year old with what is most likely a benign pelvic mass s/p remote hx of hysterectomy and possible BSO.   CA 125 10/24/14: 13U/mL  The patient had forgotten about her [place] appointment. We will reschedule this. We will recheck CA 125 today. If it remains stable and low and the US demonstrates stable dimensions, I recommend no further followup and do not recommend surgery.    HPI: Michelle Jones is a 81 year old woman who had the detection of a 6 cm cystic midline pelvic mass on MRI and CT scan performed for left hip pain that commenced approximately 2 months ago after a fall. She has a remote history of a vaginal hysterectomy for abnormal uterine bleeding when she was in her 86s. She was placed on Premarin orally postoperatively. She does not know definitively if ovaries were taken at that surgery.  On 02/20/2015 she underwent an MRI of the hip without contrast to evaluate the left hip and groin for pain following a fall injuring her 2016. The MRI detected a cystic mass in the anatomic pelvis that measured approximate 5 cm. Other abnormalities on the MRI included a partial tear of the left gluteus minimus tendon, left trochanteric bursitis, and the left hip osteoarthritis. To follow-up the MRI she underwent a CT scan of the abdomen and pelvis on 10/07/2014 which showed no evidence of metastatic disease including no ascites, no lymphadenopathy, no carcinomatosis. However did detect a 6.8 x 4.2 cm septated cystic mass within the cul-de-sac of the pelvis. No definitive ovaries were identified.  CA 125 in March, 2016 was normal at 13.  Interval History: she continues to be unsteady and needs a cane to walk. She  denies new pain, new bloating, new change in bladder or bowel habit.  Current Meds:  Outpatient Encounter Prescriptions as of 02/06/2015  Medication Sig  . amLODipine (NORVASC) 10 MG tablet Take 10 mg by mouth daily.   Marland Kitchen buPROPion (WELLBUTRIN XL) 300 MG 24 hr tablet Take 300 mg by mouth daily.   . Diphenhydramine-Acetaminophen (PERCOGESIC PO) Take 2 tablets by mouth at bedtime.  . DULoxetine (CYMBALTA) 60 MG capsule Take 60 mg by mouth daily.   . furosemide (LASIX) 20 MG tablet Take 20 mg by mouth daily.   Marland Kitchen levothyroxine (SYNTHROID, LEVOTHROID) 100 MCG tablet Take 100 mcg by mouth daily before breakfast.   . lisinopril-hydrochlorothiazide (PRINZIDE,ZESTORETIC) 20-25 MG per tablet Take 1 tablet by mouth daily.   . metoprolol succinate (TOPROL-XL) 100 MG 24 hr tablet Take 100 mg by mouth daily.   . pravastatin (PRAVACHOL) 40 MG tablet Take 40 mg by mouth daily.   . ranitidine (ZANTAC) 150 MG tablet Take 150 mg by mouth daily.   . ranitidine (ZANTAC) 300 MG tablet   . ZETIA 10 MG tablet Take 10 mg by mouth daily.    No facility-administered encounter medications on file as of 02/06/2015.    Allergy:  Allergies  Allergen Reactions  . Penicillins     Social Hx:   History   Social History  . Marital Status: Widowed    Spouse Name: N/A  . Number of Children: N/A  . Years of Education:  N/A   Occupational History  . Not on file.   Social History Main Topics  . Smoking status: Never Smoker   . Smokeless tobacco: Not on file  . Alcohol Use: No  . Drug Use: No  . Sexual Activity: Not on file   Other Topics Concern  . Not on file   Social History Narrative    Past Surgical Hx:  Past Surgical History  Procedure Laterality Date  . Appendectomy    . Tonsillectomy  1940s  . Carpel tunnel release  2002    Dr. Theodis Sato  . Vaginal hysterectomy      ovaries left in place??? patient on premarin postop despite premenopausal age    Past Medical Hx:  Past Medical History   Diagnosis Date  . Hypertension   . GERD (gastroesophageal reflux disease)     Past Gynecological History:  SVD, hysterectomy for abnormal uterine bleeding (likely BSO at time of hysterectomy)  No LMP recorded.  Family Hx:  Family History  Problem Relation Age of Onset  . Melanoma Mother   . Breast cancer Sister   . Diabetes Brother     Review of Systems:  Constitutional  Feels well,    ENT Normal appearing ears and nares bilaterally Skin/Breast  No rash, sores, jaundice, itching, dryness Cardiovascular  No chest pain, shortness of breath, or edema  Pulmonary  No cough or wheeze.  Gastro Intestinal  No nausea, vomitting, or diarrhoea. No bright red blood per rectum, no abdominal pain, change in bowel movement, or constipation.  Genito Urinary  No frequency, urgency, dysuria,  Musculo Skeletal  No myalgia, arthralgia, joint swelling or + pain see HPI Neurologic  No weakness, numbness, change in gait,  Psychology  No depression, anxiety, insomnia.   Vitals:  Blood pressure 108/43, pulse 78, temperature 98.2 F (36.8 C), temperature source Oral, resp. rate 16, height 5\' 3"  (1.6 m), weight 164 lb 8 oz (74.617 kg).  Physical Exam: WD in NAD  Michelle Eva, MD   02/06/2015, 2:01 PM

## 2015-02-06 NOTE — Patient Instructions (Signed)
We will contact you with the results of your CA 125 and ultrasound.  Please call for any questions or concerns.

## 2015-02-07 LAB — CA 125: CA 125: 13 U/mL (ref <35)

## 2015-02-08 ENCOUNTER — Telehealth: Payer: Self-pay | Admitting: *Deleted

## 2015-02-08 NOTE — Telephone Encounter (Signed)
Left VM for patient requesting return call.

## 2015-02-08 NOTE — Telephone Encounter (Signed)
-----   Message from Everitt Amber, MD sent at 02/08/2015  9:31 AM EDT ----- Michelle Jones, Michelle Jones has a stable CA 125. We will check [place] result, and if stable, will plan for no further followup. Thanks

## 2015-02-09 NOTE — Telephone Encounter (Signed)
Patient returned call - notified patient of information noted below by Dr. Denman George. Patient agreeable to proceed with [place] scheduled for 02/16/15. No other questions or concerns noted at this time.

## 2015-02-16 ENCOUNTER — Ambulatory Visit (HOSPITAL_COMMUNITY)
Admission: RE | Admit: 2015-02-16 | Discharge: 2015-02-16 | Disposition: A | Discharge status: Home / Self Care | Payer: Medicare Other | Source: Ambulatory Visit | Attending: Gynecologic Oncology | Admitting: Gynecologic Oncology

## 2015-02-16 DIAGNOSIS — Z780 Asymptomatic menopausal state: Secondary | ICD-10-CM | POA: Diagnosis not present

## 2015-02-16 DIAGNOSIS — R1900 Intra-abdominal and pelvic swelling, mass and lump, unspecified site: Secondary | ICD-10-CM

## 2015-02-16 DIAGNOSIS — Z90710 Acquired absence of both cervix and uterus: Secondary | ICD-10-CM | POA: Insufficient documentation

## 2015-02-21 ENCOUNTER — Telehealth: Payer: Self-pay | Admitting: Nurse Practitioner

## 2015-02-21 NOTE — Telephone Encounter (Signed)
Left non-descriptive message for patient to call clinic per Joylene John, NP.  Rn will give U/S results and instructions on call back.

## 2015-02-23 NOTE — Telephone Encounter (Signed)
Per Dr. Denman George, patient notified that from the ultrasound on 02/16/15 the cyst looks exactly the same and that she does not need surgery or any additional followup with Dr. Denman George. Pt instructed to call our office in the future with any questions or concerns.

## 2015-03-02 DIAGNOSIS — Z1231 Encounter for screening mammogram for malignant neoplasm of breast: Secondary | ICD-10-CM | POA: Diagnosis not present

## 2015-03-02 DIAGNOSIS — Z803 Family history of malignant neoplasm of breast: Secondary | ICD-10-CM | POA: Diagnosis not present

## 2015-05-05 ENCOUNTER — Encounter (HOSPITAL_COMMUNITY): Payer: Self-pay

## 2015-05-05 ENCOUNTER — Inpatient Hospital Stay (HOSPITAL_COMMUNITY)
Admission: EM | Admit: 2015-05-05 | Discharge: 2015-05-11 | DRG: 871 | Disposition: A | Discharge status: Skilled Nursing Facility | Payer: Medicare Other | Attending: Internal Medicine | Admitting: Internal Medicine

## 2015-05-05 ENCOUNTER — Emergency Department (HOSPITAL_COMMUNITY): Payer: Medicare Other

## 2015-05-05 DIAGNOSIS — J810 Acute pulmonary edema: Secondary | ICD-10-CM | POA: Diagnosis present

## 2015-05-05 DIAGNOSIS — D649 Anemia, unspecified: Secondary | ICD-10-CM | POA: Diagnosis not present

## 2015-05-05 DIAGNOSIS — M6281 Muscle weakness (generalized): Secondary | ICD-10-CM | POA: Diagnosis not present

## 2015-05-05 DIAGNOSIS — Z79899 Other long term (current) drug therapy: Secondary | ICD-10-CM | POA: Diagnosis not present

## 2015-05-05 DIAGNOSIS — D696 Thrombocytopenia, unspecified: Secondary | ICD-10-CM | POA: Diagnosis present

## 2015-05-05 DIAGNOSIS — R4182 Altered mental status, unspecified: Secondary | ICD-10-CM

## 2015-05-05 DIAGNOSIS — A4151 Sepsis due to Escherichia coli [E. coli]: Secondary | ICD-10-CM | POA: Diagnosis not present

## 2015-05-05 DIAGNOSIS — I471 Supraventricular tachycardia: Secondary | ICD-10-CM | POA: Diagnosis present

## 2015-05-05 DIAGNOSIS — R7989 Other specified abnormal findings of blood chemistry: Secondary | ICD-10-CM | POA: Diagnosis not present

## 2015-05-05 DIAGNOSIS — N390 Urinary tract infection, site not specified: Secondary | ICD-10-CM | POA: Diagnosis present

## 2015-05-05 DIAGNOSIS — E039 Hypothyroidism, unspecified: Secondary | ICD-10-CM | POA: Diagnosis present

## 2015-05-05 DIAGNOSIS — K219 Gastro-esophageal reflux disease without esophagitis: Secondary | ICD-10-CM | POA: Diagnosis present

## 2015-05-05 DIAGNOSIS — I480 Paroxysmal atrial fibrillation: Secondary | ICD-10-CM | POA: Diagnosis present

## 2015-05-05 DIAGNOSIS — J811 Chronic pulmonary edema: Secondary | ICD-10-CM

## 2015-05-05 DIAGNOSIS — I4891 Unspecified atrial fibrillation: Secondary | ICD-10-CM | POA: Diagnosis not present

## 2015-05-05 DIAGNOSIS — Z803 Family history of malignant neoplasm of breast: Secondary | ICD-10-CM | POA: Diagnosis not present

## 2015-05-05 DIAGNOSIS — R0602 Shortness of breath: Secondary | ICD-10-CM

## 2015-05-05 DIAGNOSIS — N179 Acute kidney failure, unspecified: Secondary | ICD-10-CM | POA: Diagnosis present

## 2015-05-05 DIAGNOSIS — R509 Fever, unspecified: Secondary | ICD-10-CM

## 2015-05-05 DIAGNOSIS — R2681 Unsteadiness on feet: Secondary | ICD-10-CM | POA: Diagnosis not present

## 2015-05-05 DIAGNOSIS — Z880 Allergy status to penicillin: Secondary | ICD-10-CM

## 2015-05-05 DIAGNOSIS — E876 Hypokalemia: Secondary | ICD-10-CM | POA: Diagnosis present

## 2015-05-05 DIAGNOSIS — Z833 Family history of diabetes mellitus: Secondary | ICD-10-CM

## 2015-05-05 DIAGNOSIS — I248 Other forms of acute ischemic heart disease: Secondary | ICD-10-CM | POA: Diagnosis present

## 2015-05-05 DIAGNOSIS — J9600 Acute respiratory failure, unspecified whether with hypoxia or hypercapnia: Secondary | ICD-10-CM

## 2015-05-05 DIAGNOSIS — Z808 Family history of malignant neoplasm of other organs or systems: Secondary | ICD-10-CM

## 2015-05-05 DIAGNOSIS — R778 Other specified abnormalities of plasma proteins: Secondary | ICD-10-CM | POA: Diagnosis present

## 2015-05-05 DIAGNOSIS — J9601 Acute respiratory failure with hypoxia: Secondary | ICD-10-CM | POA: Diagnosis present

## 2015-05-05 DIAGNOSIS — F329 Major depressive disorder, single episode, unspecified: Secondary | ICD-10-CM | POA: Diagnosis present

## 2015-05-05 DIAGNOSIS — I959 Hypotension, unspecified: Secondary | ICD-10-CM | POA: Diagnosis present

## 2015-05-05 DIAGNOSIS — R410 Disorientation, unspecified: Secondary | ICD-10-CM | POA: Diagnosis not present

## 2015-05-05 DIAGNOSIS — F32A Depression, unspecified: Secondary | ICD-10-CM | POA: Diagnosis present

## 2015-05-05 DIAGNOSIS — I482 Chronic atrial fibrillation: Secondary | ICD-10-CM | POA: Diagnosis not present

## 2015-05-05 DIAGNOSIS — A419 Sepsis, unspecified organism: Secondary | ICD-10-CM | POA: Diagnosis not present

## 2015-05-05 DIAGNOSIS — J9690 Respiratory failure, unspecified, unspecified whether with hypoxia or hypercapnia: Secondary | ICD-10-CM | POA: Diagnosis not present

## 2015-05-05 DIAGNOSIS — I10 Essential (primary) hypertension: Secondary | ICD-10-CM | POA: Diagnosis present

## 2015-05-05 DIAGNOSIS — E872 Acidosis: Secondary | ICD-10-CM

## 2015-05-05 DIAGNOSIS — Z66 Do not resuscitate: Secondary | ICD-10-CM | POA: Diagnosis present

## 2015-05-05 LAB — TYPE AND SCREEN
ABO/RH(D): A POS
ANTIBODY SCREEN: NEGATIVE

## 2015-05-05 LAB — CBC WITH DIFFERENTIAL/PLATELET
BASOS PCT: 0 %
Basophils Absolute: 0 10*3/uL (ref 0.0–0.1)
EOS ABS: 0 10*3/uL (ref 0.0–0.7)
EOS PCT: 0 %
HCT: 33 % — ABNORMAL LOW (ref 36.0–46.0)
Hemoglobin: 10.8 g/dL — ABNORMAL LOW (ref 12.0–15.0)
LYMPHS ABS: 0.4 10*3/uL — AB (ref 0.7–4.0)
Lymphocytes Relative: 5 %
MCH: 31.8 pg (ref 26.0–34.0)
MCHC: 32.7 g/dL (ref 30.0–36.0)
MCV: 97.1 fL (ref 78.0–100.0)
Monocytes Absolute: 0.2 10*3/uL (ref 0.1–1.0)
Monocytes Relative: 2 %
NEUTROS PCT: 93 %
Neutro Abs: 8.2 10*3/uL — ABNORMAL HIGH (ref 1.7–7.7)
PLATELETS: 151 10*3/uL (ref 150–400)
RBC: 3.4 MIL/uL — AB (ref 3.87–5.11)
RDW: 12.7 % (ref 11.5–15.5)
WBC: 8.8 10*3/uL (ref 4.0–10.5)

## 2015-05-05 LAB — ABO/RH: ABO/RH(D): A POS

## 2015-05-05 LAB — URINE MICROSCOPIC-ADD ON

## 2015-05-05 LAB — I-STAT CG4 LACTIC ACID, ED: LACTIC ACID, VENOUS: 4.39 mmol/L — AB (ref 0.5–2.0)

## 2015-05-05 LAB — URINALYSIS, ROUTINE W REFLEX MICROSCOPIC
Bilirubin Urine: NEGATIVE
GLUCOSE, UA: NEGATIVE mg/dL
KETONES UR: NEGATIVE mg/dL
NITRITE: POSITIVE — AB
PROTEIN: NEGATIVE mg/dL
Specific Gravity, Urine: 1.019 (ref 1.005–1.030)
UROBILINOGEN UA: 0.2 mg/dL (ref 0.0–1.0)
pH: 5.5 (ref 5.0–8.0)

## 2015-05-05 LAB — LACTIC ACID, PLASMA
LACTIC ACID, VENOUS: 4.4 mmol/L — AB (ref 0.5–2.0)
LACTIC ACID, VENOUS: 4.9 mmol/L — AB (ref 0.5–2.0)

## 2015-05-05 LAB — COMPREHENSIVE METABOLIC PANEL
ALT: 22 U/L (ref 14–54)
ANION GAP: 11 (ref 5–15)
AST: 38 U/L (ref 15–41)
Albumin: 3.8 g/dL (ref 3.5–5.0)
Alkaline Phosphatase: 79 U/L (ref 38–126)
BILIRUBIN TOTAL: 0.4 mg/dL (ref 0.3–1.2)
BUN: 36 mg/dL — ABNORMAL HIGH (ref 6–20)
CHLORIDE: 102 mmol/L (ref 101–111)
CO2: 22 mmol/L (ref 22–32)
Calcium: 9.2 mg/dL (ref 8.9–10.3)
Creatinine, Ser: 1.78 mg/dL — ABNORMAL HIGH (ref 0.44–1.00)
GFR, EST AFRICAN AMERICAN: 29 mL/min — AB (ref >60)
GFR, EST NON AFRICAN AMERICAN: 25 mL/min — AB (ref >60)
Glucose, Bld: 138 mg/dL — ABNORMAL HIGH (ref 65–99)
POTASSIUM: 4.2 mmol/L (ref 3.5–5.1)
Sodium: 135 mmol/L (ref 135–145)
TOTAL PROTEIN: 7.2 g/dL (ref 6.5–8.1)

## 2015-05-05 LAB — I-STAT TROPONIN, ED: TROPONIN I, POC: 0.12 ng/mL — AB (ref 0.00–0.08)

## 2015-05-05 LAB — PROTIME-INR
INR: 1.29 (ref 0.00–1.49)
PROTHROMBIN TIME: 16.3 s — AB (ref 11.6–15.2)

## 2015-05-05 LAB — PROCALCITONIN: Procalcitonin: 18.55 ng/mL

## 2015-05-05 LAB — TROPONIN I: Troponin I: 0.28 ng/mL — ABNORMAL HIGH (ref <0.031)

## 2015-05-05 LAB — APTT: aPTT: 31 seconds (ref 24–37)

## 2015-05-05 LAB — CORTISOL: CORTISOL PLASMA: 53 ug/dL

## 2015-05-05 LAB — BRAIN NATRIURETIC PEPTIDE: B NATRIURETIC PEPTIDE 5: 385.9 pg/mL — AB (ref 0.0–100.0)

## 2015-05-05 LAB — MRSA PCR SCREENING: MRSA by PCR: NEGATIVE

## 2015-05-05 MED ORDER — LEVOFLOXACIN IN D5W 750 MG/150ML IV SOLN
750.0000 mg | Freq: Once | INTRAVENOUS | Status: AC
  Administered 2015-05-05: 750 mg via INTRAVENOUS
  Filled 2015-05-05: qty 150

## 2015-05-05 MED ORDER — SODIUM CHLORIDE 0.9 % IV BOLUS (SEPSIS)
500.0000 mL | INTRAVENOUS | Status: AC
  Administered 2015-05-05: 500 mL via INTRAVENOUS

## 2015-05-05 MED ORDER — SODIUM CHLORIDE 0.9 % IV BOLUS (SEPSIS): 1000.0000 mL | Freq: Once | INTRAVENOUS | Status: DC

## 2015-05-05 MED ORDER — DEXTROSE 5 % IV SOLN
2.0000 g | Freq: Once | INTRAVENOUS | Status: AC
  Administered 2015-05-05: 2 g via INTRAVENOUS
  Filled 2015-05-05: qty 2

## 2015-05-05 MED ORDER — VANCOMYCIN HCL IN DEXTROSE 750-5 MG/150ML-% IV SOLN
750.0000 mg | INTRAVENOUS | Status: DC
  Administered 2015-05-06 – 2015-05-08 (×3): 750 mg via INTRAVENOUS
  Filled 2015-05-05 (×3): qty 150

## 2015-05-05 MED ORDER — SODIUM CHLORIDE 0.9 % IV BOLUS (SEPSIS)
500.0000 mL | Freq: Once | INTRAVENOUS | Status: AC
Start: 2015-05-05 — End: 2015-05-05
  Administered 2015-05-05: 500 mL via INTRAVENOUS

## 2015-05-05 MED ORDER — AZTREONAM 1 G IJ SOLR
1.0000 g | Freq: Three times a day (TID) | INTRAMUSCULAR | Status: DC
  Administered 2015-05-05 – 2015-05-11 (×17): 1 g via INTRAVENOUS
  Filled 2015-05-05 (×19): qty 1

## 2015-05-05 MED ORDER — ACETAMINOPHEN 325 MG PO TABS
650.0000 mg | ORAL_TABLET | Freq: Once | ORAL | Status: AC
  Administered 2015-05-05: 650 mg via ORAL
  Filled 2015-05-05: qty 2

## 2015-05-05 MED ORDER — SODIUM CHLORIDE 0.9 % IV SOLN
1000.0000 mL | INTRAVENOUS | Status: DC
  Administered 2015-05-05 – 2015-05-07 (×3): 1000 mL via INTRAVENOUS

## 2015-05-05 MED ORDER — LEVOFLOXACIN IN D5W 750 MG/150ML IV SOLN
750.0000 mg | INTRAVENOUS | Status: DC
  Administered 2015-05-07: 750 mg via INTRAVENOUS
  Filled 2015-05-05 (×3): qty 150

## 2015-05-05 MED ORDER — VANCOMYCIN HCL IN DEXTROSE 1-5 GM/200ML-% IV SOLN
1000.0000 mg | Freq: Once | INTRAVENOUS | Status: AC
  Administered 2015-05-05: 1000 mg via INTRAVENOUS
  Filled 2015-05-05: qty 200

## 2015-05-05 MED ORDER — HEPARIN SODIUM (PORCINE) 5000 UNIT/ML IJ SOLN
5000.0000 [IU] | Freq: Two times a day (BID) | INTRAMUSCULAR | Status: DC
  Administered 2015-05-05 – 2015-05-11 (×12): 5000 [IU] via SUBCUTANEOUS
  Filled 2015-05-05 (×12): qty 1

## 2015-05-05 MED ORDER — SODIUM CHLORIDE 0.9 % IV BOLUS (SEPSIS)
1000.0000 mL | INTRAVENOUS | Status: AC
  Administered 2015-05-05 (×2): 1000 mL via INTRAVENOUS

## 2015-05-05 MED ORDER — SODIUM CHLORIDE 0.9 % IV SOLN: 1000.0000 mL | INTRAVENOUS | Status: DC

## 2015-05-05 MED ORDER — PHENYLEPHRINE HCL 10 MG/ML IJ SOLN
30.0000 ug/min | INTRAVENOUS | Status: DC
  Administered 2015-05-05: 45 ug/min via INTRAVENOUS
  Filled 2015-05-05: qty 1

## 2015-05-05 MED ORDER — FUROSEMIDE 10 MG/ML IJ SOLN
20.0000 mg | Freq: Once | INTRAMUSCULAR | Status: AC
  Administered 2015-05-05: 20 mg via INTRAVENOUS
  Filled 2015-05-05: qty 2

## 2015-05-05 NOTE — Progress Notes (Signed)
ANTIBIOTIC CONSULT NOTE - INITIAL  Pharmacy Consult for vancomycin, aztreonam, levaquin Indication: rule out sepsis  Allergies  Allergen Reactions  . Penicillins     Has patient had a PCN reaction causing immediate rash, facial/tongue/throat swelling, SOB or lightheadedness with hypotension: unknown Has patient had a PCN reaction causing severe rash involving mucus membranes or skin necrosis: unknown Has patient had a PCN reaction that required hospitalization unknown Has patient had a PCN reaction occurring within the last 10 years: no If all of the above answers are "NO", then may proceed with Cephalosporin use.     Patient Measurements: weight 75 kg, height 63 inches    Vital Signs: Temp: 102.7 F (39.3 C) (09/23 1043) Temp Source: Rectal (09/23 1043) BP: 129/48 mmHg (09/23 0926) Pulse Rate: 113 (09/23 0926) Intake/Output from previous day:   Intake/Output from this shift:    Labs:  Recent Labs  05/05/15 0946  WBC 8.8  HGB 10.8*  PLT 151   CrCl cannot be calculated (Unknown ideal weight.). No results for input(s): VANCOTROUGH, VANCOPEAK, VANCORANDOM, GENTTROUGH, GENTPEAK, GENTRANDOM, TOBRATROUGH, TOBRAPEAK, TOBRARND, AMIKACINPEAK, AMIKACINTROU, AMIKACIN in the last 72 hours.   Microbiology: No results found for this or any previous visit (from the past 720 hour(s)).  Medical History: Past Medical History  Diagnosis Date  . Hypertension   . GERD (gastroesophageal reflux disease)    Assessment: Patient's an 81 y.o F presented to the ED with c/o fever.  LA drawn on the ED was elevated at 4.39.  To start broad abx for suspected sepsis.  Goal of Therapy:  Vancomycin trough level 15-20 mcg/ml  Plan:  - vanco 1gm x1 in ED, then 750mg  IV q24h - aztreonam 2gm x1 in ED, then 1gm IV q8h - levaquin 750mg  IV x1 in ED, then 750mg  IV q48h - f/u renal function and adjust dose if/when appropriate  Javari Bufkin P 05/05/2015,10:45 AM

## 2015-05-05 NOTE — ED Notes (Signed)
Jana Half  RN at bedside for lab draw

## 2015-05-05 NOTE — ED Notes (Signed)
Per EMS, pt from home.  Pt c/o fever.  Time unknown. Pt lives alone.  Pt family found her warm and confused.  HX of UTI.  Pt denies urinary symptoms.  Vitals: 132/50, hr 120, resp 20, cbg 174, temp 101.5

## 2015-05-05 NOTE — ED Notes (Signed)
Bed: WA11 Expected date:  Expected time:  Means of arrival:  Comments: EMS-[facility]I 

## 2015-05-05 NOTE — Progress Notes (Signed)
eLink Physician-Brief Progress Note Patient Name: Michelle Jones DOB: Jun 06, 1930 MRN: [id number]   Date of Service  05/05/2015  HPI/Events of Note  Multiple issues: 1. Patient in and out of AFIB with RVR (V rate = 130's). Now NSR with rate = 85. 2. No DVT prophylaxis and 2. Patient desats with minimal exertion. Now on Room air with sat = 95 and RR = 16.  Last Lactic Acid = 4.9. BP = 101/57 with MAP = 66. Admission CXR read as pulmonary congestion. Has received 3 L of crystalloid after that CXR.  eICU Interventions  Will order: 1. Lasix 20 mg IV now.  2. Decrease IV fluid to 75 mL/hour. 3. BMP and Mg++ level now.  4. Heparin 5000 units [place] Q 12 hours.      Intervention Category Major Interventions: Arrhythmia - evaluation and management  Sommer,Steven Cornelia Copa 05/05/2015, 10:53 PM

## 2015-05-05 NOTE — Progress Notes (Signed)
Pt has not voided since admitted to room. ED RN reported doing In and out cath but no output was recorded. Bladder scanned and noted 331 ml of urine. Pt denied any urge to void at this time.

## 2015-05-05 NOTE — ED Provider Notes (Signed)
CSN: [id number]     Arrival date & time 05/05/15  4492 History   First MD Initiated Contact with Patient 05/05/15 [phone]     Chief Complaint  Patient presents with  . Fever   HPI  Ms. Rix is an 81 year old female with PMHx of HTN and GERD presenting with AMS and fever. Pt reports feeling chills yesterday and had one episode of vomiting. Pt appears mildly confused and answers "I don't know" to many questions. Daughter present in room states that she attempted to call her mother multiple times this morning without answer. She decided to check on her and found her sitting in her recliner and confused. Pt denies any complaints.   Past Medical History  Diagnosis Date  . Hypertension   . GERD (gastroesophageal reflux disease)    Past Surgical History  Procedure Laterality Date  . Appendectomy    . Tonsillectomy  1940s  . Carpel tunnel release  2002    Dr. Theodis Sato  . Vaginal hysterectomy      ovaries left in place??? patient on premarin postop despite premenopausal age   Family History  Problem Relation Age of Onset  . Melanoma Mother   . Breast cancer Sister   . Diabetes Brother    Social History  Substance Use Topics  . Smoking status: Never Smoker   . Smokeless tobacco: None  . Alcohol Use: No   OB History    No data available     Review of Systems  Constitutional: Positive for fever and chills. Negative for diaphoresis.  HENT: Negative for congestion, rhinorrhea and sore throat.   Eyes: Negative for redness and visual disturbance.  Respiratory: Negative for cough, shortness of breath and wheezing.   Cardiovascular: Negative for chest pain.  Gastrointestinal: Positive for vomiting. Negative for nausea, abdominal pain and diarrhea.  Genitourinary: Negative for dysuria, hematuria and flank pain.  Musculoskeletal: Negative for back pain and neck pain.  Skin: Negative for rash.  Neurological: Negative for headaches.  Hematological: Does not bruise/bleed easily.   Psychiatric/Behavioral: Positive for confusion.      Allergies  Penicillins  Home Medications   Prior to Admission medications   Medication Sig Start Date End Date Taking? Authorizing Provider  acetaminophen (TYLENOL) 325 MG tablet Take 650 mg by mouth every 6 (six) hours as needed for moderate pain.   Yes Historical Provider, MD  amLODipine (NORVASC) 10 MG tablet Take 10 mg by mouth daily.  08/21/14  Yes Historical Provider, MD  buPROPion (WELLBUTRIN XL) 300 MG 24 hr tablet Take 300 mg by mouth daily.  10/03/14  Yes Historical Provider, MD  Diphenhydramine-Acetaminophen (PERCOGESIC PO) Take 2 tablets by mouth at bedtime as needed (pain).    Yes Historical Provider, MD  DULoxetine (CYMBALTA) 60 MG capsule Take 60 mg by mouth daily.  10/03/14  Yes Historical Provider, MD  furosemide (LASIX) 20 MG tablet Take 20-40 mg by mouth daily as needed for fluid.  08/19/14  Yes Historical Provider, MD  levothyroxine (SYNTHROID, LEVOTHROID) 100 MCG tablet Take 100 mcg by mouth daily before breakfast.  10/22/14  Yes Historical Provider, MD  lisinopril-hydrochlorothiazide (PRINZIDE,ZESTORETIC) 20-25 MG per tablet Take 1 tablet by mouth daily.  08/21/14  Yes Historical Provider, MD  metoprolol succinate (TOPROL-XL) 100 MG 24 hr tablet Take 100 mg by mouth daily.  08/21/14  Yes Historical Provider, MD  ranitidine (ZANTAC) 300 MG tablet Take 300 mg by mouth at bedtime.  01/26/15  Yes Historical Provider, MD  BP 112/42 mmHg  Pulse 85  Temp(Src) 98.9 F (37.2 C) (Oral)  Resp 21  Ht 5\' 3"  (1.6 m)  Wt 164 lb (74.39 kg)  BMI 29.06 kg/m2  SpO2 96% Physical Exam  Constitutional: She appears well-developed and well-nourished. No distress.  HENT:  Head: Normocephalic and atraumatic.  Mouth/Throat: Oropharynx is clear and moist. No oropharyngeal exudate.  Eyes: Conjunctivae and EOM are normal. Pupils are equal, round, and reactive to light. Right eye exhibits no discharge. Left eye exhibits no discharge. No  scleral icterus.  Neck: Normal range of motion.  Cardiovascular: Normal rate, regular rhythm and normal heart sounds.   Pulmonary/Chest: Effort normal and breath sounds normal. No respiratory distress. She has no wheezes. She has no rales.  Abdominal: Soft. There is no tenderness.  Musculoskeletal: Normal range of motion.  Neurological: She is alert. Coordination normal.  Oriented to person and place but unsure why she is in ED. Cranial nerves 3-12. 5/5 grip and ankle strength.   Skin: Skin is warm and dry. No rash noted.  Psychiatric: Her speech is normal and behavior is normal. Cognition and memory are impaired.  Pt confused on exam.   Nursing note and vitals reviewed.   ED Course  Procedures (including critical care time) CRITICAL CARE Performed by: Eston Esters   Total critical care time: 30  Critical care time was exclusive of separately billable procedures and treating other patients.  Critical care was necessary to treat or prevent imminent or life-threatening deterioration.  Critical care was time spent personally by me on the following activities: development of treatment plan with patient and/or surrogate as well as nursing, discussions with consultants, evaluation of patient's response to treatment, examination of patient, obtaining history from patient or surrogate, ordering and performing treatments and interventions, ordering and review of laboratory studies, ordering and review of radiographic studies, pulse oximetry and re-evaluation of patient's condition.   Labs Review Labs Reviewed  COMPREHENSIVE METABOLIC PANEL - Abnormal; Notable for the following:    Glucose, Bld 138 (*)    BUN 36 (*)    Creatinine, Ser 1.78 (*)    GFR calc non Af Amer 25 (*)    GFR calc Af Amer 29 (*)    All other components within normal limits  URINALYSIS, ROUTINE W REFLEX MICROSCOPIC (NOT AT [facility]) - Abnormal; Notable for the following:    APPearance CLOUDY (*)    Hgb urine dipstick  MODERATE (*)    Nitrite POSITIVE (*)    Leukocytes, UA MODERATE (*)    All other components within normal limits  CBC WITH DIFFERENTIAL/PLATELET - Abnormal; Notable for the following:    RBC 3.40 (*)    Hemoglobin 10.8 (*)    HCT 33.0 (*)    Neutro Abs 8.2 (*)    Lymphs Abs 0.4 (*)    All other components within normal limits  BRAIN NATRIURETIC PEPTIDE - Abnormal; Notable for the following:    B Natriuretic Peptide 385.9 (*)    All other components within normal limits  URINE MICROSCOPIC-ADD ON - Abnormal; Notable for the following:    Bacteria, UA MANY (*)    Casts HYALINE CASTS (*)    All other components within normal limits  LACTIC ACID, PLASMA - Abnormal; Notable for the following:    Lactic Acid, Venous 4.4 (*)    All other components within normal limits  LACTIC ACID, PLASMA - Abnormal; Notable for the following:    Lactic Acid, Venous 4.9 (*)  All other components within normal limits  TROPONIN I - Abnormal; Notable for the following:    Troponin I 0.28 (*)    All other components within normal limits  PROTIME-INR - Abnormal; Notable for the following:    Prothrombin Time 16.3 (*)    All other components within normal limits  I-STAT CG4 LACTIC ACID, ED - Abnormal; Notable for the following:    Lactic Acid, Venous 4.39 (*)    All other components within normal limits  I-STAT TROPOININ, ED - Abnormal; Notable for the following:    Troponin i, poc 0.12 (*)    All other components within normal limits  CULTURE, BLOOD (ROUTINE X 2)  CULTURE, BLOOD (ROUTINE X 2)  URINE CULTURE  MRSA PCR SCREENING  CORTISOL  PROCALCITONIN  APTT  I-STAT CG4 LACTIC ACID, ED  TYPE AND SCREEN  ABO/RH    Imaging Review Dg Chest 2 View  05/05/2015   CLINICAL DATA:  Initial encounter for fever and dizziness.  EXAM: CHEST  2 VIEW  COMPARISON:  04/01/2005  FINDINGS: Two views study shows pulmonary vascular congestion with increased interstitial opacities suggesting edema. Cardiopericardial  silhouette is at upper limits of normal for size. No evidence for pleural effusion. No focal airspace consolidation. Imaged bony structures of the thorax are intact.  IMPRESSION: Vascular congestion with interstitial pulmonary edema.   Electronically Signed   By: Misty Stanley M.D.   On: 05/05/2015 10:18   I have personally reviewed and evaluated these images and lab results as part of my medical decision-making.   EKG Interpretation None      MDM   Final diagnoses:  Fever, unspecified fever cause  Altered mental status, unspecified altered mental status type   Pt with PMHx of HTN and GERD presenting with fever and AMS per daughter. She had one episode of vomiting yesterday but currently has no complaints. She is moderately confused and replies "I don't know" to many questions. Febrile to 102.7 and tachy. Code sepsis initiated with fluid resuscitation and empiric abx. Lactic acid 4.39. Troponin 0.12 Called critical care who will admit pt to ICU.     Josephina Gip, PA-C 05/05/15 2024  Sharlett Iles, MD 05/06/15 0830

## 2015-05-05 NOTE — ED Notes (Signed)
MD at bedside. 

## 2015-05-05 NOTE — ED Notes (Signed)
MD Little notified of troponin level

## 2015-05-05 NOTE — Progress Notes (Signed)
Multiple discussions made with bedside RN and Dr Elsworth Soho about pt's sepsis bundle, marginal BP, and code status throughout the afternoon.  Dr Elsworth Soho consulted with Dr Lake Bells.  Code status addressed.  Awaiting further orders for BP.

## 2015-05-05 NOTE — ED Notes (Signed)
PA at bedside.

## 2015-05-05 NOTE — Progress Notes (Signed)
Received call from Sol, RN, regarding critical lab value lactic acid increased to 4.9 and notified eMD Dr. Elsworth Soho

## 2015-05-05 NOTE — H&P (Signed)
PULMONARY / CRITICAL CARE MEDICINE   Name: KRIS BURD MRN: [id number] DOB: [birth year]    ADMISSION DATE:  05/05/2015   REFERRING MD :  EDP  CHIEF COMPLAINT: Nausea  INITIAL PRESENTATION: Confused with fever  STUDIES:  9/23 2 d>>  SIGNIFICANT EVENTS:    HISTORY OF PRESENT ILLNESS:   81 yo , never smoker/drinker who reports 24 hours of nausea, chills without purulent sputum, cough, diarrhea. Her daughter reports calling he multiple times with no answer. Went to her home and found her confused. She was taken to [facility] ED for evaluation. Lactic acid was 4.3 and temp was 102.7 rectal. Chest x ray was notable for edema and troponin was elevated at 0.12. Her WBC was 8. She is awake and follows commands, on abx per EDP, her creatine is elevated at 1.7 without previous creatine to compare to. PCCM aske to respond to Code sepsis and admit patient. Careful fluid resuscitation will be needed due to pulmonary edema. 2 d echo and serial cardiac enzymes will ordered. Admit to ICU but may be able to go out soon if she responds to treatment. Pan culture and abd x ray for completeness. She appears neuro intact but may ct head, most likely has UTI as source of sepsis. Note she is on lasix.  PAST MEDICAL HISTORY :   has a past medical history of Hypertension and GERD (gastroesophageal reflux disease).  has past surgical history that includes Appendectomy; Tonsillectomy (1940s); carpel tunnel release (2002); and Vaginal hysterectomy. Prior to Admission medications   Medication Sig Start Date End Date Taking? Authorizing Provider  acetaminophen (TYLENOL) 325 MG tablet Take 650 mg by mouth every 6 (six) hours as needed for moderate pain.   Yes Historical Provider, MD  amLODipine (NORVASC) 10 MG tablet Take 10 mg by mouth daily.  08/21/14  Yes Historical Provider, MD  buPROPion (WELLBUTRIN XL) 300 MG 24 hr tablet Take 300 mg by mouth daily.  10/03/14  Yes Historical Provider, MD  Diphenhydramine-Acetaminophen  (PERCOGESIC PO) Take 2 tablets by mouth at bedtime as needed (pain).    Yes Historical Provider, MD  DULoxetine (CYMBALTA) 60 MG capsule Take 60 mg by mouth daily.  10/03/14  Yes Historical Provider, MD  furosemide (LASIX) 20 MG tablet Take 20-40 mg by mouth daily as needed for fluid.  08/19/14  Yes Historical Provider, MD  levothyroxine (SYNTHROID, LEVOTHROID) 100 MCG tablet Take 100 mcg by mouth daily before breakfast.  10/22/14  Yes Historical Provider, MD  lisinopril-hydrochlorothiazide (PRINZIDE,ZESTORETIC) 20-25 MG per tablet Take 1 tablet by mouth daily.  08/21/14  Yes Historical Provider, MD  metoprolol succinate (TOPROL-XL) 100 MG 24 hr tablet Take 100 mg by mouth daily.  08/21/14  Yes Historical Provider, MD  ranitidine (ZANTAC) 300 MG tablet Take 300 mg by mouth at bedtime.  01/26/15  Yes Historical Provider, MD   Allergies  Allergen Reactions  . Penicillins     Has patient had a PCN reaction causing immediate rash, facial/tongue/throat swelling, SOB or lightheadedness with hypotension: unknown Has patient had a PCN reaction causing severe rash involving mucus membranes or skin necrosis: unknown Has patient had a PCN reaction that required hospitalization unknown Has patient had a PCN reaction occurring within the last 10 years: no If all of the above answers are "NO", then may proceed with Cephalosporin use.     FAMILY HISTORY:  has no family status information on file.  SOCIAL HISTORY:  reports that she has never smoked. She does not have any  smokeless tobacco history on file. She reports that she does not drink alcohol or use illicit drugs.  REVIEW OF SYSTEMS:   10 point review of system taken, please see HPI for positives and negatives.   SUBJECTIVE:   VITAL SIGNS: Temp:  [98.4 F (36.9 C)-102.7 F (39.3 C)] 98.4 F (36.9 C) (09/23 1146) Pulse Rate:  [95-113] 95 (09/23 1141) Resp:  [18-35] 35 (09/23 1141) BP: (98-129)/(34-48) 98/34 mmHg (09/23 1141) SpO2:  [94 %-96 %] 96  % (09/23 1141) Weight:  [164 lb (74.39 kg)] 164 lb (74.39 kg) (09/23 1142) HEMODYNAMICS:   VENTILATOR SETTINGS:   INTAKE / OUTPUT: No intake or output data in the 24 hours ending 05/05/15 1205  PHYSICAL EXAMINATION: General:  WNWDWF, orientated but somewhat stunned affect. Facial tremor noted(old per daughter). MAEx 4 , follows commands Neuro:  Stunned but intact. HEENT:   PERL. EOM intact, no JVD/LAN, oral mucosa dry Cardiovascular: HSR RRR Lungs:  CTA Abdomen: Soft. NT. + bs Musculoskeletal:  intact Skin:  Warm and dry  LABS:  CBC  Recent Labs Lab 05/05/15 0946  WBC 8.8  HGB 10.8*  HCT 33.0*  PLT 151   Coag's No results for input(s): APTT, INR in the last 168 hours. BMET  Recent Labs Lab 05/05/15 0946  NA 135  K 4.2  CL 102  CO2 22  BUN 36*  CREATININE 1.78*  GLUCOSE 138*   Electrolytes  Recent Labs Lab 05/05/15 0946  CALCIUM 9.2   Sepsis Markers  Recent Labs Lab 05/05/15 1004  LATICACIDVEN 4.39*   ABG No results for input(s): PHART, PCO2ART, PO2ART in the last 168 hours. Liver Enzymes  Recent Labs Lab 05/05/15 0946  AST 38  ALT 22  ALKPHOS 79  BILITOT 0.4  ALBUMIN 3.8   Cardiac Enzymes No results for input(s): TROPONINI, PROBNP in the last 168 hours. Glucose No results for input(s): GLUCAP in the last 168 hours.  Imaging Dg Chest 2 View  05/05/2015   CLINICAL DATA:  Initial encounter for fever and dizziness.  EXAM: CHEST  2 VIEW  COMPARISON:  04/01/2005  FINDINGS: Two views study shows pulmonary vascular congestion with increased interstitial opacities suggesting edema. Cardiopericardial silhouette is at upper limits of normal for size. No evidence for pleural effusion. No focal airspace consolidation. Imaged bony structures of the thorax are intact.  IMPRESSION: Vascular congestion with interstitial pulmonary edema.   Electronically Signed   By: Misty Stanley M.D.   On: 05/05/2015 10:18     ASSESSMENT /  PLAN:  PULMONARY OETT A: No acute issue on room air P:   O2 if needed  CARDIOVASCULAR CVL  A:  Hypotension +CE P:  Most likely UTI with continued lasix. Gentle hydration, hold lasix, treat sepsis Seril CE, most likely stress induced Check 2 d  RENAL A:  Renal insuff with lasix use and sepsis P:   Hold lasix Follow creatine if worsens will check renal US  GASTROINTESTINAL A:   Nausea P:   Zofran PPI  HEMATOLOGIC A:   No acute issue P:  Hold anticoagulate for now  INFECTIOUS A:   Presumed UTI s source P:   BCx2 9/23>> UC 9/23>> Sputum Abx: PCN all 9/23 Levaquin>> 9/23 Azactam>> 9/23 Vanco(consider dc)>>  ENDOCRINE A:  No acute issue P:     NEUROLOGIC A:   AMS most likely secondary to metabolic state P:   RASS goal:1 If remains confused then CT head may be needed Hold anticoagulate   FAMILY  -  Updates: Daughter updated at bedside  - Inter-disciplinary family meet or Palliative Care meeting due by:   day 7     TODAY'S SUMMARY:  81 yo , never smoker/drinker who reports 24 hours of nausea, chills without purulent sputum, cough, diarrhea. Her daughter reports calling he multiple times with no answer. Went to her home and found her confused. She was taken to [facility] ED for evaluation. Lactic acid was 4.3 and temp was 102.7 rectal. Chest x ray was notable for edema and troponin was elevated at 0.12. Her WBC was 8. She is awake and follows commands, on abx per EDP, her creatine is elevated at 1.7 without previous creatine to compare to. PCCM aske to respond to Code sepsis and admit patient. Careful fluid resuscitation will be needed due to pulmonary edema. 2 d echo and serial cardiac enzymes will ordered. Admit to ICU but may be able to go out soon if she responds to treatment. Pan culture and abd x ray for completeness. She appears neuro intact but may ct head, most likely has UTI as source of sepsis. Note she is on lasix.  Richardson Landry Minor ACNP Maryanna Shape  PCCM Pager [phone] till 3 pm If no answer page [phone] 05/05/2015, 12:15 PM

## 2015-05-06 ENCOUNTER — Inpatient Hospital Stay (HOSPITAL_COMMUNITY): Payer: Medicare Other

## 2015-05-06 DIAGNOSIS — I4891 Unspecified atrial fibrillation: Secondary | ICD-10-CM

## 2015-05-06 LAB — BASIC METABOLIC PANEL
ANION GAP: 8 (ref 5–15)
BUN: 31 mg/dL — ABNORMAL HIGH (ref 6–20)
CO2: 22 mmol/L (ref 22–32)
Calcium: 8.3 mg/dL — ABNORMAL LOW (ref 8.9–10.3)
Chloride: 108 mmol/L (ref 101–111)
Creatinine, Ser: 1.33 mg/dL — ABNORMAL HIGH (ref 0.44–1.00)
GFR calc Af Amer: 42 mL/min — ABNORMAL LOW (ref >60)
GFR, EST NON AFRICAN AMERICAN: 36 mL/min — AB (ref >60)
GLUCOSE: 106 mg/dL — AB (ref 65–99)
POTASSIUM: 4.1 mmol/L (ref 3.5–5.1)
Sodium: 138 mmol/L (ref 135–145)

## 2015-05-06 LAB — TROPONIN I
TROPONIN I: 0.18 ng/mL — AB (ref <0.031)
TROPONIN I: 0.19 ng/mL — AB (ref <0.031)
Troponin I: 0.13 ng/mL — ABNORMAL HIGH (ref <0.031)

## 2015-05-06 LAB — MAGNESIUM: Magnesium: 1.7 mg/dL (ref 1.7–2.4)

## 2015-05-06 LAB — LACTIC ACID, PLASMA: Lactic Acid, Venous: 1.5 mmol/L (ref 0.5–2.0)

## 2015-05-06 MED ORDER — DILTIAZEM HCL 100 MG IV SOLR
5.0000 mg/h | INTRAVENOUS | Status: DC
  Administered 2015-05-06 – 2015-05-07 (×2): 5 mg/h via INTRAVENOUS
  Administered 2015-05-07: 7.5 mg/h via INTRAVENOUS
  Administered 2015-05-08 (×2): 15 mg/h via INTRAVENOUS
  Filled 2015-05-06 (×5): qty 100

## 2015-05-06 MED ORDER — DILTIAZEM LOAD VIA INFUSION
10.0000 mg | Freq: Once | INTRAVENOUS | Status: AC
  Administered 2015-05-06: 10 mg via INTRAVENOUS
  Filled 2015-05-06: qty 10

## 2015-05-06 MED ORDER — ACETAMINOPHEN 325 MG PO TABS
650.0000 mg | ORAL_TABLET | Freq: Four times a day (QID) | ORAL | Status: DC | PRN
  Administered 2015-05-06 – 2015-05-07 (×2): 650 mg via ORAL
  Filled 2015-05-06 (×2): qty 2

## 2015-05-06 MED ORDER — FUROSEMIDE 10 MG/ML IJ SOLN
20.0000 mg | Freq: Once | INTRAMUSCULAR | Status: AC
  Administered 2015-05-06: 20 mg via INTRAVENOUS

## 2015-05-06 MED ORDER — MAGNESIUM SULFATE IN D5W 10-5 MG/ML-% IV SOLN
1.0000 g | Freq: Once | INTRAVENOUS | Status: AC
  Administered 2015-05-06: 1 g via INTRAVENOUS
  Filled 2015-05-06: qty 100

## 2015-05-06 MED ORDER — FUROSEMIDE 10 MG/ML IJ SOLN
INTRAMUSCULAR | Status: AC
  Filled 2015-05-06: qty 2

## 2015-05-06 MED ORDER — ALBUTEROL SULFATE (2.5 MG/3ML) 0.083% IN NEBU
2.5000 mg | INHALATION_SOLUTION | RESPIRATORY_TRACT | Status: DC | PRN
  Administered 2015-05-06 – 2015-05-07 (×2): 2.5 mg via RESPIRATORY_TRACT
  Filled 2015-05-06 (×2): qty 3

## 2015-05-06 NOTE — Progress Notes (Signed)
  Echocardiogram 2D Echocardiogram has been performed.  Michelle Jones 05/06/2015, 3:24 PM

## 2015-05-06 NOTE — H&P (Signed)
PULMONARY / CRITICAL CARE MEDICINE   Name: Michelle Jones MRN: [id number] DOB: August 14, 1929    ADMISSION DATE:  05/05/2015   REFERRING MD :  EDP  CHIEF COMPLAINT: Nausea  INITIAL PRESENTATION: Confused with fever  STUDIES:  9/23 2 d>>  SIGNIFICANT EVENTS: Admitted with urosepsis.  HISTORY OF PRESENT ILLNESS:   81 yo , never smoker/drinker who reports 24 hours of nausea, chills without purulent sputum, cough, diarrhea. Her daughter reports calling her multiple times with no answer. Went to her home and found her confused. She was taken to [facility] ED for evaluation. Lactic acid was 4.3 and temp was 102.7 rectal. Chest x ray was notable for edema and troponin was elevated at 0.12. Her WBC was 8. She is awake and follows commands, on abx per EDP, her creatine is elevated at 1.7 without previous creatine to compare to. PCCM aske to respond to Code sepsis and admit patient. Careful fluid resuscitation will be needed due to pulmonary edema. 2 d echo and serial cardiac enzymes will ordered. Admit to ICU but may be able to go out soon if she responds to treatment. Pan culture and abd x ray for completeness. She appears neuro intact but may ct head, most likely has UTI as source of sepsis. Note she is on lasix.  PAST MEDICAL HISTORY :   has a past medical history of Hypertension and GERD (gastroesophageal reflux disease).  has past surgical history that includes Appendectomy; Tonsillectomy (1940s); carpel tunnel release (2002); and Vaginal hysterectomy. Prior to Admission medications   Medication Sig Start Date End Date Taking? Authorizing Provider  acetaminophen (TYLENOL) 325 MG tablet Take 650 mg by mouth every 6 (six) hours as needed for moderate pain.   Yes Historical Provider, MD  amLODipine (NORVASC) 10 MG tablet Take 10 mg by mouth daily.  08/21/14  Yes Historical Provider, MD  buPROPion (WELLBUTRIN XL) 300 MG 24 hr tablet Take 300 mg by mouth daily.  10/03/14  Yes Historical Provider, MD   Diphenhydramine-Acetaminophen (PERCOGESIC PO) Take 2 tablets by mouth at bedtime as needed (pain).    Yes Historical Provider, MD  DULoxetine (CYMBALTA) 60 MG capsule Take 60 mg by mouth daily.  10/03/14  Yes Historical Provider, MD  furosemide (LASIX) 20 MG tablet Take 20-40 mg by mouth daily as needed for fluid.  08/19/14  Yes Historical Provider, MD  levothyroxine (SYNTHROID, LEVOTHROID) 100 MCG tablet Take 100 mcg by mouth daily before breakfast.  10/22/14  Yes Historical Provider, MD  lisinopril-hydrochlorothiazide (PRINZIDE,ZESTORETIC) 20-25 MG per tablet Take 1 tablet by mouth daily.  08/21/14  Yes Historical Provider, MD  metoprolol succinate (TOPROL-XL) 100 MG 24 hr tablet Take 100 mg by mouth daily.  08/21/14  Yes Historical Provider, MD  ranitidine (ZANTAC) 300 MG tablet Take 300 mg by mouth at bedtime.  01/26/15  Yes Historical Provider, MD   Allergies  Allergen Reactions  . Penicillins     Has patient had a PCN reaction causing immediate rash, facial/tongue/throat swelling, SOB or lightheadedness with hypotension: unknown Has patient had a PCN reaction causing severe rash involving mucus membranes or skin necrosis: unknown Has patient had a PCN reaction that required hospitalization unknown Has patient had a PCN reaction occurring within the last 10 years: no If all of the above answers are "NO", then may proceed with Cephalosporin use.     FAMILY HISTORY:  has no family status information on file.  SOCIAL HISTORY:  reports that she has never smoked. She does not have  any smokeless tobacco history on file. She reports that she does not drink alcohol or use illicit drugs.  REVIEW OF SYSTEMS:   10 point review of system taken, please see HPI for positives and negatives.  SUBJECTIVE: Feels well. Asking for food  VITAL SIGNS: Temp:  [97.8 F (36.6 C)-98.9 F (37.2 C)] 97.8 F (36.6 C) (09/24 0800) Pulse Rate:  [33-157] 89 (09/24 0700) Resp:  [15-35] 24 (09/24 0700) BP:  (76-146)/(28-124) 113/55 mmHg (09/24 0700) SpO2:  [90 %-99 %] 93 % (09/24 0700) Weight:  [164 lb (74.39 kg)-168 lb 6.9 oz (76.4 kg)] 168 lb 6.9 oz (76.4 kg) (09/24 0400) HEMODYNAMICS:   VENTILATOR SETTINGS:   INTAKE / OUTPUT:  Intake/Output Summary (Last 24 hours) at 05/06/15 1056 Last data filed at 05/06/15 0800  Gross per 24 hour  Intake 4402.5 ml  Output   1750 ml  Net 2652.5 ml    PHYSICAL EXAMINATION: General:  Awake, responsive. Neuro: No focal deficit. Facial tremor (old as per family) HEENT:   PERRL, moist mucus membranes Cardiovascular: S1,S2 + RRR Lungs:  Clear anteriorly Abdomen: Soft. +BS Skin:  Warm and dry  LABS:  CBC  Recent Labs Lab 05/05/15 0946  WBC 8.8  HGB 10.8*  HCT 33.0*  PLT 151   Coag's  Recent Labs Lab 05/05/15 1305  APTT 31  INR 1.29   BMET  Recent Labs Lab 05/05/15 0946 05/06/15 0015  NA 135 138  K 4.2 4.1  CL 102 108  CO2 22 22  BUN 36* 31*  CREATININE 1.78* 1.33*  GLUCOSE 138* 106*   Electrolytes  Recent Labs Lab 05/05/15 0946 05/06/15 0015  CALCIUM 9.2 8.3*  MG  --  1.7   Sepsis Markers  Recent Labs Lab 05/05/15 1004 05/05/15 1301 05/05/15 1305 05/05/15 1604  LATICACIDVEN 4.39* 4.4*  --  4.9*  PROCALCITON  --   --  18.55  --    ABG No results for input(s): PHART, PCO2ART, PO2ART in the last 168 hours. Liver Enzymes  Recent Labs Lab 05/05/15 0946  AST 38  ALT 22  ALKPHOS 79  BILITOT 0.4  ALBUMIN 3.8   Cardiac Enzymes  Recent Labs Lab 05/05/15 1305  TROPONINI 0.28*   Glucose No results for input(s): GLUCAP in the last 168 hours.  Imaging No results found.   ASSESSMENT / PLAN:  PULMONARY OETT A: No acute issue on room air P:   O2 if needed. Given lasix for CHF on CXR. Careful with fluids  CARDIOVASCULAR CVL  A:  Hypotension +CE Intermittent A fib. Now in NSR. P:  Gentle hydration. Follow troponins Echocardiogram  RENAL A:  Renal insuff with lasix use and  sepsis P:   Cr is improving with fluids. Will continue to monitor  GASTROINTESTINAL A:   Nausea P:   Start PO diet.  HEMATOLOGIC A:   No acute issue P:  Hold anticoagulate for now  INFECTIOUS A:   Presumed UTI s source P:   BCx2 9/23>> GN coccobacilli UC 9/23>> Sputum  Abx: PCN all 9/23 Levaquin>> 9/23 Azactam>> 9/23 Vanco>>  ENDOCRINE A:  No acute issue P:    NEUROLOGIC A:   AMS most likely secondary to metabolic state P:   RASS goal:1 Improved mental status. Hold anticoagulate   FAMILY  - Updates: Daughter updated at bedside - Inter-disciplinary family meet or Palliative Care meeting due by:   day 7  TODAY'S SUMMARY:  81 yo , never smoker/drinker who reports 24 hours of nausea, chills  without purulent sputum, cough, diarrhea. Her daughter reports calling he multiple times with no answer. Went to her home and found her confused. She was taken to [facility] ED for evaluation. Lactic acid was 4.3 and temp was 102.7 rectal. Chest x ray was notable for edema and troponin was elevated at 0.12. Her WBC was 8. She is awake and follows commands, on abx per EDP, her creatine is elevated at 1.7 without previous creatine to compare to. PCCM aske to respond to Code sepsis and admit patient. Careful fluid resuscitation will be needed due to pulmonary edema. 2 d echo and serial cardiac enzymes will ordered. Admit to ICU but may be able to go out soon if she responds to treatment. Pan culture and abd x ray for completeness. She appears neuro intact but may ct head, most likely has UTI as source of sepsis. Note she is on lasix..  Critical care time- 45 mins.  Marshell Garfinkel MD [place] [facility] Pager [phone] If no answer or after 3pm call: [phone] 05/06/2015, 11:05 AM

## 2015-05-06 NOTE — Progress Notes (Signed)
Notified Dr.Mannam because patient had 2 episodes of brief SVT hr in 180's and complaints of SOB. Patient converted back to ST hr 120's.  Respirations 30. Oxygen level 94% on 2L via  [place].  New orders received

## 2015-05-06 NOTE — Progress Notes (Signed)
eLink Physician-Brief Progress Note Patient Name: Michelle Jones DOB: Nov 18, 1929 MRN: [id number]   Date of Service  05/06/2015  HPI/Events of Note  RN notes mild wheezing.  Pt c/o headache.   eICU Interventions  Will order prn albuterol, and prn tylenol.     Intervention Category Major Interventions: Other:  SOOD,VINEET 05/06/2015, 4:23 PM

## 2015-05-06 NOTE — Progress Notes (Signed)
eLink Physician-Brief Progress Note Patient Name: MAILY DEBARGE DOB: Jul 31, 1930 MRN: [id number]   Date of Service  05/06/2015  HPI/Events of Note  Mg++ = 1.7 and Creatinine = 1.33.  eICU Interventions  Will replete Mg++.     Intervention Category Intermediate Interventions: Electrolyte abnormality - evaluation and management  Sommer,Steven Eugene 05/06/2015, 6:12 AM

## 2015-05-07 ENCOUNTER — Inpatient Hospital Stay (HOSPITAL_COMMUNITY): Payer: Medicare Other

## 2015-05-07 LAB — CBC
HCT: 29.8 % — ABNORMAL LOW (ref 36.0–46.0)
Hemoglobin: 9.9 g/dL — ABNORMAL LOW (ref 12.0–15.0)
MCH: 32 pg (ref 26.0–34.0)
MCHC: 33.2 g/dL (ref 30.0–36.0)
MCV: 96.4 fL (ref 78.0–100.0)
PLATELETS: 111 10*3/uL — AB (ref 150–400)
RBC: 3.09 MIL/uL — AB (ref 3.87–5.11)
RDW: 13.2 % (ref 11.5–15.5)
WBC: 19.9 10*3/uL — AB (ref 4.0–10.5)

## 2015-05-07 LAB — URINE CULTURE

## 2015-05-07 LAB — BASIC METABOLIC PANEL
ANION GAP: 7 (ref 5–15)
BUN: 21 mg/dL — ABNORMAL HIGH (ref 6–20)
CALCIUM: 8.4 mg/dL — AB (ref 8.9–10.3)
CO2: 25 mmol/L (ref 22–32)
Chloride: 110 mmol/L (ref 101–111)
Creatinine, Ser: 1.01 mg/dL — ABNORMAL HIGH (ref 0.44–1.00)
GFR, EST AFRICAN AMERICAN: 58 mL/min — AB (ref >60)
GFR, EST NON AFRICAN AMERICAN: 50 mL/min — AB (ref >60)
GLUCOSE: 110 mg/dL — AB (ref 65–99)
POTASSIUM: 3.3 mmol/L — AB (ref 3.5–5.1)
SODIUM: 142 mmol/L (ref 135–145)

## 2015-05-07 LAB — MAGNESIUM: MAGNESIUM: 1.9 mg/dL (ref 1.7–2.4)

## 2015-05-07 LAB — PHOSPHORUS: Phosphorus: 2.3 mg/dL — ABNORMAL LOW (ref 2.5–4.6)

## 2015-05-07 MED ORDER — FUROSEMIDE 10 MG/ML IJ SOLN
20.0000 mg | Freq: Two times a day (BID) | INTRAMUSCULAR | Status: DC
  Administered 2015-05-07 – 2015-05-09 (×4): 20 mg via INTRAVENOUS
  Filled 2015-05-07 (×4): qty 2

## 2015-05-07 MED ORDER — METOPROLOL SUCCINATE ER 100 MG PO TB24
100.0000 mg | ORAL_TABLET | Freq: Every day | ORAL | Status: DC
  Administered 2015-05-07 – 2015-05-11 (×5): 100 mg via ORAL
  Filled 2015-05-07 (×3): qty 4
  Filled 2015-05-07 (×2): qty 1

## 2015-05-07 MED ORDER — METOPROLOL TARTRATE 1 MG/ML IV SOLN
5.0000 mg | INTRAVENOUS | Status: DC | PRN
  Administered 2015-05-07: 5 mg via INTRAVENOUS
  Filled 2015-05-07: qty 5

## 2015-05-07 NOTE — Progress Notes (Addendum)
eLink Physician-Brief Progress Note Patient Name: Michelle Jones DOB: [birth year] MRN: [id number]   Date of Service  05/07/2015  HPI/Events of Note  Continues to be tachycardic with HR in 140-160s on cardizem drip. EKG shows SVT. BP is OK  eICU Interventions  Will restart her home dose of metoprolol XL. Use metorpolol 5mg  IV PRN. Check thyroid function tests, troponins.     Intervention Category Major Interventions: Arrhythmia - evaluation and management  Praveen Mannam 05/07/2015, 10:24 PM

## 2015-05-07 NOTE — Progress Notes (Signed)
PULMONARY / CRITICAL CARE MEDICINE   Name: Michelle Jones MRN: [id number] DOB: 1929/12/08    ADMISSION DATE:  05/05/2015   REFERRING MD :  EDP  CHIEF COMPLAINT: Nausea  INITIAL PRESENTATION: Confused with fever  STUDIES:  9/23 2   SIGNIFICANT EVENTS: 9/23 Admitted with urosepsis. 9/24 Intermitted afib. Started on cardizem drip  HISTORY OF PRESENT ILLNESS:   81 yo , never smoker/drinker who reports 24 hours of nausea, chills without purulent sputum, cough, diarrhea. Her daughter reports calling her multiple times with no answer. Went to her home and found her confused. She was taken to [facility] ED for evaluation. Lactic acid was 4.3 and temp was 102.7 rectal. Chest x ray was notable for edema and troponin was elevated at 0.12. Her WBC was 8. She is awake and follows commands, on abx per EDP, her creatine is elevated at 1.7 without previous creatine to compare to. PCCM aske to respond to Code sepsis and admit patient. Careful fluid resuscitation will be needed due to pulmonary edema. 2 d echo and serial cardiac enzymes will ordered. Admit to ICU but may be able to go out soon if she responds to treatment. Pan culture and abd x ray for completeness. She appears neuro intact but may ct head, most likely has UTI as source of sepsis. Note she is on lasix.  PAST MEDICAL HISTORY :   has a past medical history of Hypertension and GERD (gastroesophageal reflux disease).  has past surgical history that includes Appendectomy; Tonsillectomy (1940s); carpel tunnel release (2002); and Vaginal hysterectomy. Prior to Admission medications   Medication Sig Start Date End Date Taking? Authorizing Provider  acetaminophen (TYLENOL) 325 MG tablet Take 650 mg by mouth every 6 (six) hours as needed for moderate pain.   Yes Historical Provider, MD  amLODipine (NORVASC) 10 MG tablet Take 10 mg by mouth daily.  08/21/14  Yes Historical Provider, MD  buPROPion (WELLBUTRIN XL) 300 MG 24 hr tablet Take 300 mg by mouth  daily.  10/03/14  Yes Historical Provider, MD  Diphenhydramine-Acetaminophen (PERCOGESIC PO) Take 2 tablets by mouth at bedtime as needed (pain).    Yes Historical Provider, MD  DULoxetine (CYMBALTA) 60 MG capsule Take 60 mg by mouth daily.  10/03/14  Yes Historical Provider, MD  furosemide (LASIX) 20 MG tablet Take 20-40 mg by mouth daily as needed for fluid.  08/19/14  Yes Historical Provider, MD  levothyroxine (SYNTHROID, LEVOTHROID) 100 MCG tablet Take 100 mcg by mouth daily before breakfast.  10/22/14  Yes Historical Provider, MD  lisinopril-hydrochlorothiazide (PRINZIDE,ZESTORETIC) 20-25 MG per tablet Take 1 tablet by mouth daily.  08/21/14  Yes Historical Provider, MD  metoprolol succinate (TOPROL-XL) 100 MG 24 hr tablet Take 100 mg by mouth daily.  08/21/14  Yes Historical Provider, MD  ranitidine (ZANTAC) 300 MG tablet Take 300 mg by mouth at bedtime.  01/26/15  Yes Historical Provider, MD   Allergies  Allergen Reactions  . Penicillins     Has patient had a PCN reaction causing immediate rash, facial/tongue/throat swelling, SOB or lightheadedness with hypotension: unknown Has patient had a PCN reaction causing severe rash involving mucus membranes or skin necrosis: unknown Has patient had a PCN reaction that required hospitalization unknown Has patient had a PCN reaction occurring within the last 10 years: no If all of the above answers are "NO", then may proceed with Cephalosporin use.     FAMILY HISTORY:  has no family status information on file.  SOCIAL HISTORY:  reports that  she has never smoked. She does not have any smokeless tobacco history on file. She reports that she does not drink alcohol or use illicit drugs.  REVIEW OF SYSTEMS:   10 point review of system taken, please see HPI for positives and negatives.  SUBJECTIVE: Started on cardizem drip for afib. Now in NSR.  VITAL SIGNS: Temp:  [98.1 F (36.7 C)-99.3 F (37.4 C)] 98.1 F (36.7 C) (09/25 0814) Pulse Rate:   [74-161] 84 (09/25 1009) Resp:  [14-33] 22 (09/25 1009) BP: (90-156)/(33-91) 143/51 mmHg (09/25 1009) SpO2:  [91 %-100 %] 99 % (09/25 1009) HEMODYNAMICS:   VENTILATOR SETTINGS:   INTAKE / OUTPUT:  Intake/Output Summary (Last 24 hours) at 05/07/15 1051 Last data filed at 05/07/15 0700  Gross per 24 hour  Intake   2010 ml  Output    950 ml  Net   1060 ml    PHYSICAL EXAMINATION: General:  Awake, responsive. Neuro: No focal deficit. Facial tremor (old as per family) HEENT:   PERRL, moist mucus membranes Cardiovascular: S1,S2 + RRR Lungs:  Clear anteriorly Abdomen: Soft. +BS Skin:  Warm and dry  LABS:  CBC  Recent Labs Lab 05/05/15 0946 05/07/15 0335  WBC 8.8 19.9*  HGB 10.8* 9.9*  HCT 33.0* 29.8*  PLT 151 111*   Coag's  Recent Labs Lab 05/05/15 1305  APTT 31  INR 1.29   BMET  Recent Labs Lab 05/05/15 0946 05/06/15 0015 05/07/15 0335  NA 135 138 142  K 4.2 4.1 3.3*  CL 102 108 110  CO2 22 22 25   BUN 36* 31* 21*  CREATININE 1.78* 1.33* 1.01*  GLUCOSE 138* 106* 110*   Electrolytes  Recent Labs Lab 05/05/15 0946 05/06/15 0015 05/07/15 0335  CALCIUM 9.2 8.3* 8.4*  MG  --  1.7 1.9  PHOS  --   --  2.3*   Sepsis Markers  Recent Labs Lab 05/05/15 1301 05/05/15 1305 05/05/15 1604 05/06/15 1153  LATICACIDVEN 4.4*  --  4.9* 1.5  PROCALCITON  --  18.55  --   --    ABG No results for input(s): PHART, PCO2ART, PO2ART in the last 168 hours. Liver Enzymes  Recent Labs Lab 05/05/15 0946  AST 38  ALT 22  ALKPHOS 79  BILITOT 0.4  ALBUMIN 3.8   Cardiac Enzymes  Recent Labs Lab 05/06/15 1153 05/06/15 1750 05/06/15 2315  TROPONINI 0.18* 0.19* 0.13*   Glucose No results for input(s): GLUCAP in the last 168 hours.  Imaging Dg Chest Port 1 View  05/07/2015   CLINICAL DATA:  Acute respiratory failure.  EXAM: PORTABLE CHEST 1 VIEW  COMPARISON:  05/06/2015  FINDINGS: Mild cardiomegaly. Diffuse interstitial prominence throughout the  lungs, likely mild edema. No significant change. No visible effusions or acute bony abnormality.  IMPRESSION: Stable mild interstitial edema/ CHF.   Electronically Signed   By: Rolm Baptise M.D.   On: 05/07/2015 07:23   Dg Chest Port 1 View  05/06/2015   CLINICAL DATA:  Patient with episodes of SVT.  Shortness of breath.  EXAM: PORTABLE CHEST 1 VIEW  COMPARISON:  Chest radiograph 05/05/2015  FINDINGS: Monitoring leads overlie the patient. Stable enlarged cardiac and mediastinal contours. Bilateral predominately perihilar interstitial opacities.  IMPRESSION: Mild interstitial pulmonary edema.   Electronically Signed   By: Lovey Newcomer M.D.   On: 05/06/2015 14:18     ASSESSMENT / PLAN:  PULMONARY OETT A: No acute issue on room air P:   O2 if needed.   CARDIOVASCULAR  CVL  A:  Hypotension +CE Intermittent A fib. Now in NSR. P:  D/C IVF. Start lasix for diuresis Stop cardizem drip as she is in NSR. Lopressor PRN Echocardiogram  RENAL A:  Renal insuff with lasix use and sepsis P:   Cr is improving with fluids. Will continue to monitor  GASTROINTESTINAL A:   Nausea P:   PO diet.  HEMATOLOGIC A:   No acute issue P:  Hold anticoagulate for now  INFECTIOUS A:   Presumed UTI s source P:   BCx2 9/23>> GN coccobacilli UC 9/23>> Sputum  Abx: PCN all 9/23 Levaquin>> 9/23 Azactam>> 9/23 Vanco>>  Narrow after final culture results.  ENDOCRINE A:  No acute issue P:    NEUROLOGIC A:   AMS most likely secondary to metabolic state P:   RASS goal:1 Improved mental status. Hold anticoagulate   FAMILY  - Updates: Daughter updated at bedside - Inter-disciplinary family meet or Palliative Care meeting due by:   day 7  TODAY'S SUMMARY:  81 yo , never smoker/drinker who reports 24 hours of nausea, chills without purulent sputum, cough, diarrhea. Her daughter reports calling he multiple times with no answer. Went to her home and found her confused. She was  taken to [facility] ED for evaluation. Lactic acid was 4.3 and temp was 102.7 rectal. Chest x ray was notable for edema and troponin was elevated at 0.12. Her WBC was 8. She is awake and follows commands, on abx per EDP, her creatine is elevated at 1.7 without previous creatine to compare to. PCCM aske to respond to Code sepsis and admit patient. Careful fluid resuscitation will be needed due to pulmonary edema. 2 d echo and serial cardiac enzymes will ordered. Admit to ICU but may be able to go out soon if she responds to treatment. Pan culture and abd x ray for completeness. She appears neuro intact but may ct head, most likely has UTI as source of sepsis. Note she is on lasix..  Critical care time- 45 mins.  Marshell Garfinkel MD [place] [facility] Pager [phone] If no answer or after 3pm call: [phone] 05/07/2015, 10:51 AM

## 2015-05-08 ENCOUNTER — Inpatient Hospital Stay (HOSPITAL_COMMUNITY): Payer: Medicare Other

## 2015-05-08 LAB — BASIC METABOLIC PANEL
ANION GAP: 10 (ref 5–15)
BUN: 14 mg/dL (ref 6–20)
CHLORIDE: 108 mmol/L (ref 101–111)
CO2: 23 mmol/L (ref 22–32)
Calcium: 8.6 mg/dL — ABNORMAL LOW (ref 8.9–10.3)
Creatinine, Ser: 1.02 mg/dL — ABNORMAL HIGH (ref 0.44–1.00)
GFR calc Af Amer: 57 mL/min — ABNORMAL LOW (ref >60)
GFR calc non Af Amer: 49 mL/min — ABNORMAL LOW (ref >60)
GLUCOSE: 124 mg/dL — AB (ref 65–99)
POTASSIUM: 3.8 mmol/L (ref 3.5–5.1)
Sodium: 141 mmol/L (ref 135–145)

## 2015-05-08 LAB — TROPONIN I
TROPONIN I: 0.06 ng/mL — AB (ref <0.031)
Troponin I: 0.08 ng/mL — ABNORMAL HIGH (ref <0.031)
Troponin I: 0.06 ng/mL — ABNORMAL HIGH (ref <0.031)

## 2015-05-08 LAB — TSH: TSH: 3.101 u[IU]/mL (ref 0.350–4.500)

## 2015-05-08 LAB — CBC
HEMATOCRIT: 31.5 % — AB (ref 36.0–46.0)
HEMOGLOBIN: 10.4 g/dL — AB (ref 12.0–15.0)
MCH: 31.2 pg (ref 26.0–34.0)
MCHC: 33 g/dL (ref 30.0–36.0)
MCV: 94.6 fL (ref 78.0–100.0)
PLATELETS: 134 10*3/uL — AB (ref 150–400)
RBC: 3.33 MIL/uL — AB (ref 3.87–5.11)
RDW: 12.9 % (ref 11.5–15.5)
WBC: 14.3 10*3/uL — ABNORMAL HIGH (ref 4.0–10.5)

## 2015-05-08 LAB — CULTURE, BLOOD (ROUTINE X 2)

## 2015-05-08 LAB — T4, FREE: FREE T4: 1.16 ng/dL — AB (ref 0.61–1.12)

## 2015-05-08 MED ORDER — SODIUM CHLORIDE 0.9 % IV SOLN
1000.0000 mL | INTRAVENOUS | Status: DC
  Administered 2015-05-08: 10 mL/h via INTRAVENOUS

## 2015-05-08 MED ORDER — SENNOSIDES-DOCUSATE SODIUM 8.6-50 MG PO TABS
1.0000 | ORAL_TABLET | Freq: Two times a day (BID) | ORAL | Status: DC
  Administered 2015-05-08 – 2015-05-11 (×6): 1 via ORAL
  Filled 2015-05-08 (×6): qty 1

## 2015-05-08 MED ORDER — DILTIAZEM HCL 100 MG IV SOLR: 5.0000 mg/h | INTRAVENOUS | Status: DC

## 2015-05-08 MED ORDER — DEXTROSE 5 % IV SOLN: 5.0000 mg/h | INTRAVENOUS | Status: AC

## 2015-05-08 MED ORDER — DILTIAZEM HCL 30 MG PO TABS
30.0000 mg | ORAL_TABLET | Freq: Four times a day (QID) | ORAL | Status: DC
  Administered 2015-05-08 – 2015-05-10 (×8): 30 mg via ORAL
  Filled 2015-05-08 (×8): qty 1

## 2015-05-08 NOTE — Progress Notes (Signed)
PULMONARY / CRITICAL CARE MEDICINE   Name: Michelle Jones MRN: [id number] DOB: February 02, 1930    ADMISSION DATE:  05/05/2015   REFERRING MD :  EDP  CHIEF COMPLAINT: Nausea  INITIAL PRESENTATION: 81 y/o F admitted on 9/23 with confusion & fever.  Concern for possible sepsis, suspect urine as source. PCCM called for ICU admit.    STUDIES:  9/25  ECHO >> EF 65-70%, poor image quality, unable to assess LV diastolic fxn  SIGNIFICANT EVENTS: 9/23 Admitted with urosepsis. 9/24 Intermitted afib. Started on cardizem drip     SUBJECTIVE:  NSR on monitor.  Pt reports feeling better this am.  Notes swelling in hands and feet.    VITAL SIGNS: Temp:  [97.8 F (36.6 C)-98.6 F (37 C)] 98.6 F (37 C) (09/26 0800) Pulse Rate:  [71-157] 71 (09/26 0730) Resp:  [13-30] 15 (09/26 0730) BP: (123-174)/(48-106) 155/52 mmHg (09/26 0730) SpO2:  [95 %-100 %] 98 % (09/26 0730) Weight:  [169 lb 8.5 oz (76.9 kg)] 169 lb 8.5 oz (76.9 kg) (09/26 0400)    VENTILATOR SETTINGS:     INTAKE / OUTPUT:  Intake/Output Summary (Last 24 hours) at 05/08/15 1059 Last data filed at 05/08/15 0600  Gross per 24 hour  Intake 2508.25 ml  Output   1950 ml  Net 558.25 ml    PHYSICAL EXAMINATION: General:  wdwn elderly female in NAD Neuro: No focal deficit. Facial tremor (old as per family) HEENT:   PERRL, moist mucus membranes Cardiovascular: S1,S2 + RRR Lungs:  Clear anteriorly, few soft basilar crackles posterior Abdomen: Soft. +BS Skin:  Warm and dry, trace edema in hands and ankles  LABS:  CBC  Recent Labs Lab 05/05/15 0946 05/07/15 0335 05/08/15 0520  WBC 8.8 19.9* 14.3*  HGB 10.8* 9.9* 10.4*  HCT 33.0* 29.8* 31.5*  PLT 151 111* 134*   Coag's  Recent Labs Lab 05/05/15 1305  APTT 31  INR 1.29   BMET  Recent Labs Lab 05/06/15 0015 05/07/15 0335 05/08/15 0520  NA 138 142 141  K 4.1 3.3* 3.8  CL 108 110 108  CO2 22 25 23   BUN 31* 21* 14  CREATININE 1.33* 1.01* 1.02*  GLUCOSE  106* 110* 124*   Electrolytes  Recent Labs Lab 05/06/15 0015 05/07/15 0335 05/08/15 0520  CALCIUM 8.3* 8.4* 8.6*  MG 1.7 1.9  --   PHOS  --  2.3*  --    Sepsis Markers  Recent Labs Lab 05/05/15 1301 05/05/15 1305 05/05/15 1604 05/06/15 1153  LATICACIDVEN 4.4*  --  4.9* 1.5  PROCALCITON  --  18.55  --   --    ABG No results for input(s): PHART, PCO2ART, PO2ART in the last 168 hours.   Liver Enzymes  Recent Labs Lab 05/05/15 0946  AST 38  ALT 22  ALKPHOS 79  BILITOT 0.4  ALBUMIN 3.8   Cardiac Enzymes  Recent Labs Lab 05/06/15 2315 05/07/15 2315 05/08/15 0520  TROPONINI 0.13* 0.08* 0.06*   Glucose No results for input(s): GLUCAP in the last 168 hours.  Imaging Dg Chest Port 1 View  05/08/2015   CLINICAL DATA:  Acute respiratory failure, sepsis, history of hypertension, nonsmoker.  EXAM: PORTABLE CHEST 1 VIEW  COMPARISON:  Portable chest x-ray of May 07, 2015 and PA and lateral chest x-ray of May 05, 2015.  FINDINGS: The lungs are adequately inflated. The interstitial markings remain increased bilaterally. There is subsegmental atelectasis in the left lower lobe The cardiac silhouette is normal in size.  The central pulmonary vascularity is prominent. There is no pleural effusion. The bony thorax exhibits no acute abnormalities.  IMPRESSION: Persistently increased interstitial markings bilaterally consistent with edema or pneumonia. Left lower lobe subsegmental atelectasis persists as well. There has been deterioration in the appearance of the pulmonary interstitium since the study of 23 September.   Electronically Signed   By: David  Martinique M.D.   On: 05/08/2015 08:10     ASSESSMENT / PLAN:  PULMONARY OETT A: No acute issues  P:   Wean O2 to off for sats > 90%   CARDIOVASCULAR CVL A:  Hypotension - resolved.  +CE PAF - currently in NSR. P:  Lasix 20 mg IV BID for diuresis Begin oral cardizem 30 mg QID, then d/c gtt 2 hours after   Lopressor 100 mg XL BID  Lopressor PRN HR > 120 ECHO as above  RENAL A:  Acute Renal Insufficiency - in setting of lasix use and sepsis.  Resolved.  P:   Monitor BMP / UOP  Replace electrolytes as indicated  Lasix as above  GASTROINTESTINAL A:   Nausea P:   Diet as tolerated  PRN zofran   HEMATOLOGIC A:   Mild Thrombocytopenia  Anemia  P:  SCD's for DVT prophylaxis  Monitor CBC   INFECTIOUS A:   Sepsis secondary to E-Coli UTI P:   BCx2 9/23 >> E-Coli >> res Cipro, bactrim, otherwise sensitive UC 9/23 >> multiple species   Abx: PCN Allergic  9/23 Levaquin >> 9/26 9/23 Azactam >> 9/23 Vanco >> 9/26  Narrow after final culture results.  ENDOCRINE A:  No acute issue P:   Monitor glucose on BMP  NEUROLOGIC A:   AMS most likely secondary to metabolic state - resolved P:   Supportive care Promote sleep / wake cycle in elderly, at risk for delirium    FAMILY  - Updates:  No family at bedside.  Patient updated.   - Inter-disciplinary family meet or Palliative Care meeting due by:  day [place], NP-C [place] [facility] Pgr: [phone] or if no answer [phone] 05/08/2015, 11:36 AM

## 2015-05-08 NOTE — Progress Notes (Signed)
ANTIBIOTIC CONSULT NOTE - Follow Up  Pharmacy Consult for aztreonam Indication: sepsis, bacteremia  Allergies  Allergen Reactions  . Penicillins     Has patient had a PCN reaction causing immediate rash, facial/tongue/throat swelling, SOB or lightheadedness with hypotension: unknown Has patient had a PCN reaction causing severe rash involving mucus membranes or skin necrosis: unknown Has patient had a PCN reaction that required hospitalization unknown Has patient had a PCN reaction occurring within the last 10 years: no If all of the above answers are "NO", then may proceed with Cephalosporin use.     Patient Measurements: weight 75 kg, height 63 inches Height: 5\' 3"  (160 cm) Weight: 169 lb 8.5 oz (76.9 kg) IBW/kg (Calculated) : 52.4  Vital Signs: Temp: 97.8 F (36.6 C) (09/26 1200) Temp Source: Axillary (09/26 1200) BP: 125/95 mmHg (09/26 1200) Pulse Rate: 81 (09/26 1200) Intake/Output from previous day: 09/25 0701 - 09/26 0700 In: 2508.3 [I.V.:2308.3; IV Piggyback:200] Out: 1950 [WPVXY:8016] Intake/Output from this shift: Total I/O In: 6.7 [I.V.:6.7] Out: 500 [Urine:500]  Labs:  Recent Labs  05/06/15 0015 05/07/15 0335 05/08/15 0520  WBC  --  19.9* 14.3*  HGB  --  9.9* 10.4*  PLT  --  111* 134*  CREATININE 1.33* 1.01* 1.02*   Estimated Creatinine Clearance: 41 mL/min (by C-G formula based on Cr of 1.02). No results for input(s): VANCOTROUGH, VANCOPEAK, VANCORANDOM, GENTTROUGH, GENTPEAK, GENTRANDOM, TOBRATROUGH, TOBRAPEAK, TOBRARND, AMIKACINPEAK, AMIKACINTROU, AMIKACIN in the last 72 hours.   Microbiology: Recent Results (from the past 720 hour(s))  Culture, blood (routine x 2)     Status: None   Collection Time: 05/05/15  9:46 AM  Result Value Ref Range Status   Specimen Description BLOOD RIGHT FOREARM  Final   Special Requests BOTTLES DRAWN AEROBIC AND ANAEROBIC 5CC  Final   Culture  Setup Time   Final    GRAM NEGATIVE COCCOBACILLI ANAEROBIC BOTTLE  ONLY CRITICAL RESULT CALLED TO, READ BACK BY AND VERIFIED WITH: K CHEEK 05/06/15 @ 42 M VESTAL    Culture   Final    ESCHERICHIA COLI Performed at [facility]    Report Status 05/08/2015 FINAL  Final   Organism ID, Bacteria ESCHERICHIA COLI  Final      Susceptibility   Escherichia coli - MIC*    AMPICILLIN <=2 SENSITIVE Sensitive     CEFAZOLIN <=4 SENSITIVE Sensitive     CEFEPIME <=1 SENSITIVE Sensitive     CEFTAZIDIME <=1 SENSITIVE Sensitive     CEFTRIAXONE <=1 SENSITIVE Sensitive     CIPROFLOXACIN >=4 RESISTANT Resistant     GENTAMICIN 4 SENSITIVE Sensitive     IMIPENEM 1 SENSITIVE Sensitive     TRIMETH/SULFA >=320 RESISTANT Resistant     AMPICILLIN/SULBACTAM <=2 SENSITIVE Sensitive     PIP/TAZO <=4 SENSITIVE Sensitive     * ESCHERICHIA COLI  Culture, blood (routine x 2)     Status: None (Preliminary result)   Collection Time: 05/05/15 10:15 AM  Result Value Ref Range Status   Specimen Description BLOOD RIGHT FOREARM  Final   Special Requests BOTTLES DRAWN AEROBIC AND ANAEROBIC 5CC  Final   Culture   Final    NO GROWTH 2 DAYS Performed at [facility]    Report Status PENDING  Incomplete  Urine culture     Status: None   Collection Time: 05/05/15 10:43 AM  Result Value Ref Range Status   Specimen Description URINE, CATHETERIZED  Final   Special Requests NONE  Final   Culture  Final    MULTIPLE SPECIES PRESENT, SUGGEST RECOLLECTION Performed at [facility]    Report Status 05/07/2015 FINAL  Final  MRSA PCR Screening     Status: None   Collection Time: 05/05/15  7:50 PM  Result Value Ref Range Status   MRSA by PCR NEGATIVE NEGATIVE Final    Comment:        The GeneXpert MRSA Assay (FDA approved for NASAL specimens only), is one component of a comprehensive MRSA colonization surveillance program. It is not intended to diagnose MRSA infection nor to guide or monitor treatment for MRSA infections.     Medical History: Past Medical  History  Diagnosis Date  . Hypertension   . GERD (gastroesophageal reflux disease)    Assessment: Patient's an 81 y.o F presented to the ED with c/o fever.  LA drawn on the ED was elevated at 4.39.  To start broad abx for suspected sepsis 2/2 GNR bacteremia.  Narrowed to aztreonam today for E.coli bacteremia.    9/23 blood x 2: 1/2 E.coli (S-beta-lactams, R-Cipro/Bactrim) 9/23 urine: multiple species present  9/23 vanc>> 9/26 9/23 aztreo>> 9/23 levaquin> 9/26  Day #4 abx - aztreonam 1g IV q8h for E.coli bacteremia.  Patient has history of penicillin allergy with unknown reaction so continuing aztreonam unless family can provide more information regarding allergy.  Micro lab states that aztreonam was not tested, but patient is improving.    Goal of Therapy:  Doses adjusted per renal function Eradication of infection  Plan:  - Continue aztreonam 1gm IV q8h.   - Spoke with patient and daughter today regarding documented penicillin allergy.  Patient reports that she had sores on her mouth in her 35's.  She reports that she did not have throat swelling or shortness of breath.  She was told that she was allergic to penicillin at that time.  She does not know if she has ever received any other type of beta-lactam since then.  May be reasonable to try a cephalosporin (Ceftriaxone)?  Hershal Coria 05/08/2015,12:41 PM

## 2015-05-09 DIAGNOSIS — E039 Hypothyroidism, unspecified: Secondary | ICD-10-CM | POA: Diagnosis present

## 2015-05-09 DIAGNOSIS — F329 Major depressive disorder, single episode, unspecified: Secondary | ICD-10-CM | POA: Diagnosis present

## 2015-05-09 DIAGNOSIS — N179 Acute kidney failure, unspecified: Secondary | ICD-10-CM | POA: Diagnosis present

## 2015-05-09 DIAGNOSIS — D649 Anemia, unspecified: Secondary | ICD-10-CM

## 2015-05-09 DIAGNOSIS — R778 Other specified abnormalities of plasma proteins: Secondary | ICD-10-CM | POA: Diagnosis present

## 2015-05-09 DIAGNOSIS — I10 Essential (primary) hypertension: Secondary | ICD-10-CM | POA: Diagnosis present

## 2015-05-09 DIAGNOSIS — R7989 Other specified abnormal findings of blood chemistry: Secondary | ICD-10-CM | POA: Diagnosis present

## 2015-05-09 DIAGNOSIS — F32A Depression, unspecified: Secondary | ICD-10-CM | POA: Diagnosis present

## 2015-05-09 DIAGNOSIS — N3000 Acute cystitis without hematuria: Secondary | ICD-10-CM

## 2015-05-09 DIAGNOSIS — E876 Hypokalemia: Secondary | ICD-10-CM | POA: Diagnosis present

## 2015-05-09 DIAGNOSIS — D696 Thrombocytopenia, unspecified: Secondary | ICD-10-CM | POA: Diagnosis present

## 2015-05-09 DIAGNOSIS — A4151 Sepsis due to Escherichia coli [E. coli]: Secondary | ICD-10-CM | POA: Diagnosis present

## 2015-05-09 DIAGNOSIS — N390 Urinary tract infection, site not specified: Secondary | ICD-10-CM | POA: Diagnosis present

## 2015-05-09 LAB — CBC
HEMATOCRIT: 33.2 % — AB (ref 36.0–46.0)
HEMOGLOBIN: 11 g/dL — AB (ref 12.0–15.0)
MCH: 31.6 pg (ref 26.0–34.0)
MCHC: 33.1 g/dL (ref 30.0–36.0)
MCV: 95.4 fL (ref 78.0–100.0)
Platelets: 155 10*3/uL (ref 150–400)
RBC: 3.48 MIL/uL — ABNORMAL LOW (ref 3.87–5.11)
RDW: 13 % (ref 11.5–15.5)
WBC: 10.5 10*3/uL (ref 4.0–10.5)

## 2015-05-09 LAB — BASIC METABOLIC PANEL
ANION GAP: 7 (ref 5–15)
BUN: 17 mg/dL (ref 6–20)
CO2: 26 mmol/L (ref 22–32)
Calcium: 8.7 mg/dL — ABNORMAL LOW (ref 8.9–10.3)
Chloride: 107 mmol/L (ref 101–111)
Creatinine, Ser: 1.23 mg/dL — ABNORMAL HIGH (ref 0.44–1.00)
GFR, EST AFRICAN AMERICAN: 46 mL/min — AB (ref >60)
GFR, EST NON AFRICAN AMERICAN: 39 mL/min — AB (ref >60)
Glucose, Bld: 138 mg/dL — ABNORMAL HIGH (ref 65–99)
POTASSIUM: 3.9 mmol/L (ref 3.5–5.1)
SODIUM: 140 mmol/L (ref 135–145)

## 2015-05-09 LAB — T3, FREE: T3 FREE: 1.8 pg/mL — AB (ref 2.0–4.4)

## 2015-05-09 NOTE — Progress Notes (Signed)
Pt transferred to 1430 via wheelchair. Report given to Manuela Schwartz, RN and all questions answered.

## 2015-05-09 NOTE — Evaluation (Signed)
Physical Therapy Evaluation Patient Details Name: Michelle Jones MRN: [id number] DOB: 25-Jun-1930 Today's Date: 05/09/2015   History of Present Illness  81 yo female adm with sepsis d/t UTI; PMHx: HTN  Clinical Impression  Pt admitted with above diagnosis. Pt currently with functional limitations due to the deficits listed below (see PT Problem List).  Pt will benefit from skilled PT to increase their independence and safety with mobility to allow discharge to the venue listed below.   Pt doing well, should be  Able to return home with HHPT; will follow in acute setting     Follow Up Recommendations Home health PT;Supervision for mobility/OOB    Equipment Recommendations  None recommended by PT    Recommendations for Other Services       Precautions / Restrictions Precautions Precautions: Fall Restrictions Weight Bearing Restrictions: No      Mobility  Bed Mobility               General bed mobility comments: in chair  Transfers Overall transfer level: Needs assistance Equipment used: 1 person hand held assist;None Transfers: Sit to/from Stand Sit to Stand: Min assist         General transfer comment: assist to rise, stabilize, verbal cues for hadn placement  Ambulation/Gait Ambulation/Gait assistance: Min assist Ambulation Distance (Feet): 70 Feet Assistive device: 2 person hand held assist;1 person hand held assist Gait Pattern/deviations: Step-through pattern;Decreased stride length     General Gait Details: cues for step length and posture  Stairs            Wheelchair Mobility    Modified Rankin (Stroke Patients Only)       Balance Overall balance assessment: Needs assistance Sitting-balance support: Feet supported Sitting balance-Leahy Scale: Good       Standing balance-Leahy Scale: Fair               High level balance activites: Backward walking               Pertinent Vitals/Pain Pain Assessment: No/denies pain     Home Living Family/patient expects to be discharged to:: Private residence Living Arrangements: Alone   Type of Home: House Home Access: Stairs to enter   Technical brewer of Steps: 3 Home Layout: One level Home Equipment: Environmental consultant - 2 wheels      Prior Function Level of Independence: Independent         Comments: still drives, dtr checks in     Hand Dominance        Extremity/Trunk Assessment   Upper Extremity Assessment: Overall WFL for tasks assessed           Lower Extremity Assessment: Generalized weakness         Communication   Communication: No difficulties  Cognition Arousal/Alertness: Awake/alert Behavior During Therapy: WFL for tasks assessed/performed Overall Cognitive Status: Within Functional Limits for tasks assessed                      General Comments      Exercises        Assessment/Plan    PT Assessment Patient needs continued PT services  PT Diagnosis Difficulty walking   PT Problem List Decreased strength;Decreased activity tolerance;Decreased mobility;Decreased balance  PT Treatment Interventions DME instruction;Gait training;Therapeutic activities;Functional mobility training;Therapeutic exercise;Patient/family education   PT Goals (Current goals can be found in the Care Plan section) Acute Rehab PT Goals Patient Stated Goal: home when stronger PT Goal Formulation: With patient Time  For Goal Achievement: 05/23/15 Potential to Achieve Goals: Good    Frequency Min 3X/week   Barriers to discharge        Co-evaluation               End of Session Equipment Utilized During Treatment: Gait belt Activity Tolerance: Patient tolerated treatment well Patient left: with call bell/phone within reach;with nursing/sitter in room [facility]) Nurse Communication: Mobility status         Time: 4035-2481 PT Time Calculation (min) (ACUTE ONLY): 13 min   Charges:   PT Evaluation $Initial PT Evaluation Tier  I: 1 Procedure     PT G CodesKenyon Ana 2015/05/29, 11:50 AM

## 2015-05-09 NOTE — Progress Notes (Addendum)
PROGRESS NOTE  Michelle Jones KGS:[id number] DOB: [birth year] DOA: 05/05/2015 PCP: Vena Austria, MD  HPI: 81 yo , never smoker/drinker who was admitted to [facility] service on 9/23 with nausea / chills and new onset confusion. She was septic with elevated lactic acid and mild troponin leak, found to have a UTI as a source of her sepsis. Her course was complicated by brief A fib with RVR which resolved with Cardizem gtt. TRH took pver patient on 9/27.  Subjective / 24 H Interval events - no complaints this morning, she is feeling much better, breathing has improved - denies chest pain / palpitations  Assessment/Plan: Principal Problem:   Sepsis Active Problems:   UTI (urinary tract infection)   Essential hypertension   Hypothyroidism   Depression   AKI (acute kidney injury)   Thrombocytopenia   Hypokalemia   Elevated troponin   Anemia   E. coli sepsis   Sepsis due to UTI - On admission patient was started on vancomycin and aztreonam for sepsis, once microbiology from urine culture speciated, her vancomycin was discontinued. - Today's day 4 of antibiotics  - she is penicillin allergic, she had a "mouth reaction" with penicillin but hasn't really had any other antibiotics recently that she couldn't remember. Microbiology shows unfortunately resistance to Bactrim as well as fluoroquinolones. Continue aztreonam for now, may be enough to do 5 days alone with tomorrow as the last day  Acute hypoxic respiratory failure - Like in the setting of sepsis and fluid resuscitation, she is on room air this morning, this appears to be resolving, she received Lasix, given mild bump in creatinine will hold tonight's Lasix dose.  - Chest x-ray on 9/26 showed possible interstitial edema, now seems to be improving as she was able to be weaned off on room air. Consider restarting Lasix in the morning based on the renal function.   Acute kidney injury - Unknown baseline creatinine, overall stable  with mild creatinine bump on 9/27, likely in the setting of sepsis as well as diuresis, continue to monitor renal function - Hold her home lisinopril    A. fib with RVR - A one-time episode in the setting of sepsis, she converted back to sinus rhythm, unless A. fib recurs will hold anticoagulation for now. Continue Cardizem as well as metoprolol. - Currently she is in sinus rhythm, transfer to telemetry   Hypertension - Continue metoprolol and Cardizem, hold lisinopril, blood pressure relatively well controlled today, continue to monitor  Mild troponin elevation - Overall flat trend, no chest pain, this is likely demand ischemia  Anemia / thrombocytopenia - Worsened probably in the setting of sepsis, improving  Hypothyroidism - Continue Synthroid  Hypokalemia - Resolved after repletion   Diet: Diet regular Room service appropriate?: Yes; Fluid consistency:: Thin Fluids: none  DVT Prophylaxis: heparin s.q.  Code Status: DNR Family Communication: d/w daughter over the phone   Disposition Plan: transfer to telemetry, home 1-2 days    Consultants:  None   Procedures:  None    Antibiotics  Aztreonam 9/23 >> Vancomycin 9/23 >> 9/26 Levofloxacin 9/23 >>9/24   Studies  Dg Chest Port 1 View  05/08/2015   CLINICAL DATA:  Followup pulmonary edema.  EXAM: PORTABLE CHEST 1 VIEW  COMPARISON:  05/08/2015  FINDINGS: Cardiomegaly with vascular congestion. Diffuse interstitial prominence, similar to prior study. More confluent opacity at the left lung base could reflect atelectasis or infiltrate.  IMPRESSION: Stable diffuse interstitial prominence, likely interstitial edema. Increasing left lower lobe atelectasis  or infiltrate.   Electronically Signed   By: Rolm Baptise M.D.   On: 05/08/2015 12:22   Dg Chest Port 1 View  05/08/2015   CLINICAL DATA:  Acute respiratory failure, sepsis, history of hypertension, nonsmoker.  EXAM: PORTABLE CHEST 1 VIEW  COMPARISON:  Portable chest  x-ray of May 07, 2015 and PA and lateral chest x-ray of May 05, 2015.  FINDINGS: The lungs are adequately inflated. The interstitial markings remain increased bilaterally. There is subsegmental atelectasis in the left lower lobe The cardiac silhouette is normal in size. The central pulmonary vascularity is prominent. There is no pleural effusion. The bony thorax exhibits no acute abnormalities.  IMPRESSION: Persistently increased interstitial markings bilaterally consistent with edema or pneumonia. Left lower lobe subsegmental atelectasis persists as well. There has been deterioration in the appearance of the pulmonary interstitium since the study of 23 September.   Electronically Signed   By: Katrine Radich  [place] M.D.   On: 05/08/2015 08:10   Objective  Filed Vitals:   05/09/15 0500 05/09/15 0800 05/09/15 1110 05/09/15 1139  BP:  146/47 126/45 129/52  Pulse: 88 77 80 76  Temp:  98.2 F (36.8 C)  98.2 F (36.8 C)  TempSrc:  Oral  Oral  Resp: 20 12 20 18   Height:      Weight:      SpO2: 99% 99% 99% 100%    Intake/Output Summary (Last 24 hours) at 05/09/15 1208 Last data filed at 05/09/15 0600  Gross per 24 hour  Intake 330.84 ml  Output      0 ml  Net 330.84 ml   Filed Weights   05/05/15 1142 05/06/15 0400 05/08/15 0400  Weight: 74.39 kg (164 lb) 76.4 kg (168 lb 6.9 oz) 76.9 kg (169 lb 8.5 oz)    Exam:  GENERAL: NAD  HEENT: head NCAT, no scleral icterus. Pupils round and reactive. Mucous membranes are moist. Posterior pharynx clear of any exudate or lesions.  NECK: Supple. No carotid bruits. No lymphadenopathy or thyromegaly.  LUNGS: Clear to auscultation. No wheezing or crackles  HEART: Regular rate and rhythm without murmur. 2+ pulses, no JVD, trace eripheral edema  ABDOMEN: Soft, nontender, and nondistended. Positive bowel sounds.  EXTREMITIES: Without any cyanosis, clubbing, rash, lesions or edema.  NEUROLOGIC: non focal   PSYCHIATRIC: Normal mood and  affect  SKIN: No ulceration or induration present.  Data Reviewed: Basic Metabolic Panel:  Recent Labs Lab 05/05/15 0946 05/06/15 0015 05/07/15 0335 05/08/15 0520 05/09/15 0355  NA 135 138 142 141 140  K 4.2 4.1 3.3* 3.8 3.9  CL 102 108 110 108 107  CO2 22 22 25 23 26   GLUCOSE 138* 106* 110* 124* 138*  BUN 36* 31* 21* 14 17  CREATININE 1.78* 1.33* 1.01* 1.02* 1.23*  CALCIUM 9.2 8.3* 8.4* 8.6* 8.7*  MG  --  1.7 1.9  --   --   PHOS  --   --  2.3*  --   --    Liver Function Tests:  Recent Labs Lab 05/05/15 0946  AST 38  ALT 22  ALKPHOS 79  BILITOT 0.4  PROT 7.2  ALBUMIN 3.8   CBC:  Recent Labs Lab 05/05/15 0946 05/07/15 0335 05/08/15 0520 05/09/15 0355  WBC 8.8 19.9* 14.3* 10.5  NEUTROABS 8.2*  --   --   --   HGB 10.8* 9.9* 10.4* 11.0*  HCT 33.0* 29.8* 31.5* 33.2*  MCV 97.1 96.4 94.6 95.4  PLT 151 111* 134* 155  Cardiac Enzymes:  Recent Labs Lab 05/06/15 1750 05/06/15 2315 05/07/15 2315 05/08/15 0520 05/08/15 1053  TROPONINI 0.19* 0.13* 0.08* 0.06* 0.06*   BNP (last 3 results)  Recent Labs  05/05/15 0946  BNP 385.9*    Recent Results (from the past 240 hour(s))  Culture, blood (routine x 2)     Status: None   Collection Time: 05/05/15  9:46 AM  Result Value Ref Range Status   Specimen Description BLOOD RIGHT FOREARM  Final   Special Requests BOTTLES DRAWN AEROBIC AND ANAEROBIC 5CC  Final   Culture  Setup Time   Final    GRAM NEGATIVE COCCOBACILLI ANAEROBIC BOTTLE ONLY CRITICAL RESULT CALLED TO, READ BACK BY AND VERIFIED WITH: K CHEEK 05/06/15 @ 106 M VESTAL    Culture   Final    ESCHERICHIA COLI Performed at [facility]    Report Status 05/08/2015 FINAL  Final   Organism ID, Bacteria ESCHERICHIA COLI  Final      Susceptibility   Escherichia coli - MIC*    AMPICILLIN <=2 SENSITIVE Sensitive     CEFAZOLIN <=4 SENSITIVE Sensitive     CEFEPIME <=1 SENSITIVE Sensitive     CEFTAZIDIME <=1 SENSITIVE Sensitive     CEFTRIAXONE  <=1 SENSITIVE Sensitive     CIPROFLOXACIN >=4 RESISTANT Resistant     GENTAMICIN 4 SENSITIVE Sensitive     IMIPENEM 1 SENSITIVE Sensitive     TRIMETH/SULFA >=320 RESISTANT Resistant     AMPICILLIN/SULBACTAM <=2 SENSITIVE Sensitive     PIP/TAZO <=4 SENSITIVE Sensitive     * ESCHERICHIA COLI  Culture, blood (routine x 2)     Status: None (Preliminary result)   Collection Time: 05/05/15 10:15 AM  Result Value Ref Range Status   Specimen Description BLOOD RIGHT FOREARM  Final   Special Requests BOTTLES DRAWN AEROBIC AND ANAEROBIC 5CC  Final   Culture   Final    NO GROWTH 3 DAYS Performed at [facility]    Report Status PENDING  Incomplete  Urine culture     Status: None   Collection Time: 05/05/15 10:43 AM  Result Value Ref Range Status   Specimen Description URINE, CATHETERIZED  Final   Special Requests NONE  Final   Culture   Final    MULTIPLE SPECIES PRESENT, SUGGEST RECOLLECTION Performed at [facility]    Report Status 05/07/2015 FINAL  Final  MRSA PCR Screening     Status: None   Collection Time: 05/05/15  7:50 PM  Result Value Ref Range Status   MRSA by PCR NEGATIVE NEGATIVE Final    Comment:        The GeneXpert MRSA Assay (FDA approved for NASAL specimens only), is one component of a comprehensive MRSA colonization surveillance program. It is not intended to diagnose MRSA infection nor to guide or monitor treatment for MRSA infections.      Scheduled Meds: . aztreonam  1 g Intravenous Q8H  . diltiazem  30 mg Oral 4 times per day  . heparin subcutaneous  5,000 Units Subcutaneous Q12H  . metoprolol succinate  100 mg Oral Daily  . senna-docusate  1 tablet Oral BID   Continuous Infusions: . sodium chloride 10 mL/hr (05/08/15 1936)    Marzetta Board, MD [facility] Pager [phone]. If 7 PM - 7 AM, please contact night-coverage at [email or web address], password [facility] 05/09/2015, 12:08 PM  LOS: 4 days

## 2015-05-09 NOTE — Progress Notes (Signed)
Primary SVC transferred to [facility].  PCCM will sign off.  Please call back if we can be of further assistance.    Noe Gens, NP-C [facility] Pgr: [phone] or if no answer [phone] 05/09/2015, 9:04 AM

## 2015-05-10 DIAGNOSIS — E876 Hypokalemia: Secondary | ICD-10-CM

## 2015-05-10 LAB — CBC
HEMATOCRIT: 31.9 % — AB (ref 36.0–46.0)
HEMOGLOBIN: 10.5 g/dL — AB (ref 12.0–15.0)
MCH: 31.6 pg (ref 26.0–34.0)
MCHC: 32.9 g/dL (ref 30.0–36.0)
MCV: 96.1 fL (ref 78.0–100.0)
Platelets: 178 10*3/uL (ref 150–400)
RBC: 3.32 MIL/uL — AB (ref 3.87–5.11)
RDW: 13.1 % (ref 11.5–15.5)
WBC: 7.4 10*3/uL (ref 4.0–10.5)

## 2015-05-10 LAB — BASIC METABOLIC PANEL
ANION GAP: 6 (ref 5–15)
BUN: 18 mg/dL (ref 6–20)
CO2: 27 mmol/L (ref 22–32)
Calcium: 8.5 mg/dL — ABNORMAL LOW (ref 8.9–10.3)
Chloride: 107 mmol/L (ref 101–111)
Creatinine, Ser: 0.99 mg/dL (ref 0.44–1.00)
GFR calc Af Amer: 59 mL/min — ABNORMAL LOW (ref >60)
GFR, EST NON AFRICAN AMERICAN: 51 mL/min — AB (ref >60)
Glucose, Bld: 120 mg/dL — ABNORMAL HIGH (ref 65–99)
POTASSIUM: 3.4 mmol/L — AB (ref 3.5–5.1)
SODIUM: 140 mmol/L (ref 135–145)

## 2015-05-10 LAB — CULTURE, BLOOD (ROUTINE X 2): Culture: NO GROWTH

## 2015-05-10 MED ORDER — POTASSIUM CHLORIDE CRYS ER 20 MEQ PO TBCR
40.0000 meq | EXTENDED_RELEASE_TABLET | Freq: Once | ORAL | Status: AC
  Administered 2015-05-10: 40 meq via ORAL
  Filled 2015-05-10: qty 2

## 2015-05-10 MED ORDER — DILTIAZEM HCL ER COATED BEADS 120 MG PO CP24
120.0000 mg | ORAL_CAPSULE | Freq: Every day | ORAL | Status: DC
  Administered 2015-05-10 – 2015-05-11 (×2): 120 mg via ORAL
  Filled 2015-05-10 (×2): qty 1

## 2015-05-10 NOTE — Progress Notes (Addendum)
PROGRESS NOTE  Michelle Jones SWN:[id number] DOB: May 18, 1930 DOA: 05/05/2015 PCP: Vena Austria, MD  HPI: 81 yo , never smoker/drinker who was admitted to [facility] service on 9/23 with nausea / chills and new onset confusion. She was septic with elevated lactic acid and mild troponin leak, found to have a UTI as a source of her sepsis. Her course was complicated by brief A fib with RVR which resolved with Cardizem gtt. TRH took pver patient on 9/27.  Subjective / 24 H Interval events - no complaints this morning, she is feeling much better, breathing has improved - denies chest pain / palpitations - wants to go to rehab.   Assessment/Plan: Principal Problem:   Sepsis Active Problems:   UTI (urinary tract infection)   Essential hypertension   Hypothyroidism   Depression   AKI (acute kidney injury)   Thrombocytopenia   Hypokalemia   Elevated troponin   Anemia   E. coli sepsis   Sepsis due to UTI - On admission patient was started on vancomycin and aztreonam for sepsis, once microbiology from urine culture speciated, her vancomycin was discontinued. Completed 5 days of antibiotics. Plan to D/C aztreonam in am.  - she is penicillin allergic, she had a "mouth reaction" with penicillin but hasn't really had any other antibiotics recently that she couldn't remember. Microbiology shows unfortunately resistance to Bactrim as well as fluoroquinolones.     Acute hypoxic respiratory failure - Like in the setting of sepsis and fluid resuscitation, resolved. She is on RA and has good oxygen sats.  - Chest x-ray on 9/26 showed possible interstitial edema, now seems to be improving as she was able to be weaned off on room air. - plan to restart her lasix on discharge.   Acute kidney injury - Unknown baseline creatinine, overall stable with mild creatinine bump on 9/27, likely in the setting of sepsis as well as diuresis, continue to monitor renal function - Hold her home lisinopril .  Her renal parameters are stable this am.    A. fib with RVR - A one-time episode in the setting of sepsis, she converted back to sinus rhythm, unless A. fib recurs will hold anticoagulation for now. Continue Cardizem as well as metoprolol. - Currently she is in sinus rhythm, transfer to telemetry   Hypertension - Continue metoprolol and Cardizem, hold lisinopril,. Well controlled.   Mild troponin elevation - Overall flat trend, no chest pain, this is likely demand ischemia  Anemia / thrombocytopenia - Worsened probably in the setting of sepsis, improving  Hypothyroidism - Continue Synthroid  Hypokalemia - Replete today.  Recheck in am   Hypophosphatemia: - replete as needed. Recheck in am.    Diet: Diet regular Room service appropriate?: Yes; Fluid consistency:: Thin Fluids: none  DVT Prophylaxis: heparin s.q.  Code Status: DNR Family Communication: d/w SON over the phone   Disposition Plan: plan for D/C in am. Possibly home vs SNF.    Consultants:  None   Procedures:  None    Antibiotics  Aztreonam 9/23 >> Vancomycin 9/23 >> 9/26 Levofloxacin 9/23 >>9/24   Studies  No results found. Objective  Filed Vitals:   05/09/15 1454 05/09/15 2145 05/10/15 0554 05/10/15 0900  BP: 134/53 145/47 139/46   Pulse: 83 84 73 80  Temp: 98.2 F (36.8 C) 98.6 F (37 C) 98.1 F (36.7 C)   TempSrc: Oral Oral Oral   Resp: 14 20 20    Height:      Weight:  SpO2: 97% 97% 94%    No intake or output data in the 24 hours ending 05/10/15 1427 Filed Weights   05/05/15 1142 05/06/15 0400 05/08/15 0400  Weight: 74.39 kg (164 lb) 76.4 kg (168 lb 6.9 oz) 76.9 kg (169 lb 8.5 oz)    Exam:  GENERAL: NAD  HEENT: head NCAT, no scleral icterus. Pupils round and reactive. Mucous membranes are moist. Posterior pharynx clear of any exudate or lesions.  LUNGS: Clear to auscultation. No wheezing or crackles  HEART: Regular rate and rhythm without murmur. 2+ pulses, no JVD,  trace eripheral edema  ABDOMEN: Soft, nontender, and nondistended. Positive bowel sounds.  EXTREMITIES: Without any cyanosis, clubbing, rash, lesions or edema.  NEUROLOGIC: non focal     Data Reviewed: Basic Metabolic Panel:  Recent Labs Lab 05/06/15 0015 05/07/15 0335 05/08/15 0520 05/09/15 0355 05/10/15 0458  NA 138 142 141 140 140  K 4.1 3.3* 3.8 3.9 3.4*  CL 108 110 108 107 107  CO2 22 25 23 26 27   GLUCOSE 106* 110* 124* 138* 120*  BUN 31* 21* 14 17 18   CREATININE 1.33* 1.01* 1.02* 1.23* 0.99  CALCIUM 8.3* 8.4* 8.6* 8.7* 8.5*  MG 1.7 1.9  --   --   --   PHOS  --  2.3*  --   --   --    Liver Function Tests:  Recent Labs Lab 05/05/15 0946  AST 38  ALT 22  ALKPHOS 79  BILITOT 0.4  PROT 7.2  ALBUMIN 3.8   CBC:  Recent Labs Lab 05/05/15 0946 05/07/15 0335 05/08/15 0520 05/09/15 0355 05/10/15 0458  WBC 8.8 19.9* 14.3* 10.5 7.4  NEUTROABS 8.2*  --   --   --   --   HGB 10.8* 9.9* 10.4* 11.0* 10.5*  HCT 33.0* 29.8* 31.5* 33.2* 31.9*  MCV 97.1 96.4 94.6 95.4 96.1  PLT 151 111* 134* 155 178   Cardiac Enzymes:  Recent Labs Lab 05/06/15 1750 05/06/15 2315 05/07/15 2315 05/08/15 0520 05/08/15 1053  TROPONINI 0.19* 0.13* 0.08* 0.06* 0.06*   BNP (last 3 results)  Recent Labs  05/05/15 0946  BNP 385.9*    Recent Results (from the past 240 hour(s))  Culture, blood (routine x 2)     Status: None   Collection Time: 05/05/15  9:46 AM  Result Value Ref Range Status   Specimen Description BLOOD RIGHT FOREARM  Final   Special Requests BOTTLES DRAWN AEROBIC AND ANAEROBIC 5CC  Final   Culture  Setup Time   Final    GRAM NEGATIVE COCCOBACILLI ANAEROBIC BOTTLE ONLY CRITICAL RESULT CALLED TO, READ BACK BY AND VERIFIED WITH: K CHEEK 05/06/15 @ 48 M VESTAL    Culture   Final    ESCHERICHIA COLI Performed at [facility]    Report Status 05/08/2015 FINAL  Final   Organism ID, Bacteria ESCHERICHIA COLI  Final      Susceptibility   Escherichia  coli - MIC*    AMPICILLIN <=2 SENSITIVE Sensitive     CEFAZOLIN <=4 SENSITIVE Sensitive     CEFEPIME <=1 SENSITIVE Sensitive     CEFTAZIDIME <=1 SENSITIVE Sensitive     CEFTRIAXONE <=1 SENSITIVE Sensitive     CIPROFLOXACIN >=4 RESISTANT Resistant     GENTAMICIN 4 SENSITIVE Sensitive     IMIPENEM 1 SENSITIVE Sensitive     TRIMETH/SULFA >=320 RESISTANT Resistant     AMPICILLIN/SULBACTAM <=2 SENSITIVE Sensitive     PIP/TAZO <=4 SENSITIVE Sensitive     *  ESCHERICHIA COLI  Culture, blood (routine x 2)     Status: None   Collection Time: 05/05/15 10:15 AM  Result Value Ref Range Status   Specimen Description BLOOD RIGHT FOREARM  Final   Special Requests BOTTLES DRAWN AEROBIC AND ANAEROBIC 5CC  Final   Culture   Final    NO GROWTH 5 DAYS Performed at [facility]    Report Status 05/10/2015 FINAL  Final  Urine culture     Status: None   Collection Time: 05/05/15 10:43 AM  Result Value Ref Range Status   Specimen Description URINE, CATHETERIZED  Final   Special Requests NONE  Final   Culture   Final    MULTIPLE SPECIES PRESENT, SUGGEST RECOLLECTION Performed at [facility]    Report Status 05/07/2015 FINAL  Final  MRSA PCR Screening     Status: None   Collection Time: 05/05/15  7:50 PM  Result Value Ref Range Status   MRSA by PCR NEGATIVE NEGATIVE Final    Comment:        The GeneXpert MRSA Assay (FDA approved for NASAL specimens only), is one component of a comprehensive MRSA colonization surveillance program. It is not intended to diagnose MRSA infection nor to guide or monitor treatment for MRSA infections.      Scheduled Meds: . aztreonam  1 g Intravenous Q8H  . diltiazem  120 mg Oral Daily  . heparin subcutaneous  5,000 Units Subcutaneous Q12H  . metoprolol succinate  100 mg Oral Daily  . senna-docusate  1 tablet Oral BID   Continuous Infusions: . sodium chloride 10 mL/hr (05/08/15 1936)    Hosie Poisson, MD [facility] Pager [phone].  If 7 PM - 7 AM, please contact night-coverage at [email or web address], password [facility] 05/10/2015, 2:27 PM  LOS: 5 days

## 2015-05-10 NOTE — Progress Notes (Signed)
Spoke with pt concerning discharge plans and HH. Pt had not preference to [facility]. Referral given to [facility]. Will need HHRN/NA/PT/SW orders please. Pt states, "I do not want to go to a SNF, someone will be with me at home. I will check to see who."

## 2015-05-11 DIAGNOSIS — R609 Edema, unspecified: Secondary | ICD-10-CM | POA: Diagnosis not present

## 2015-05-11 DIAGNOSIS — B9620 Unspecified Escherichia coli [E. coli] as the cause of diseases classified elsewhere: Secondary | ICD-10-CM | POA: Diagnosis not present

## 2015-05-11 DIAGNOSIS — I129 Hypertensive chronic kidney disease with stage 1 through stage 4 chronic kidney disease, or unspecified chronic kidney disease: Secondary | ICD-10-CM | POA: Diagnosis not present

## 2015-05-11 DIAGNOSIS — F329 Major depressive disorder, single episode, unspecified: Secondary | ICD-10-CM | POA: Diagnosis not present

## 2015-05-11 DIAGNOSIS — E876 Hypokalemia: Secondary | ICD-10-CM | POA: Diagnosis not present

## 2015-05-11 DIAGNOSIS — N179 Acute kidney failure, unspecified: Secondary | ICD-10-CM | POA: Diagnosis not present

## 2015-05-11 DIAGNOSIS — K219 Gastro-esophageal reflux disease without esophagitis: Secondary | ICD-10-CM | POA: Diagnosis not present

## 2015-05-11 DIAGNOSIS — A4151 Sepsis due to Escherichia coli [E. coli]: Secondary | ICD-10-CM | POA: Diagnosis not present

## 2015-05-11 DIAGNOSIS — R600 Localized edema: Secondary | ICD-10-CM | POA: Diagnosis not present

## 2015-05-11 DIAGNOSIS — Z23 Encounter for immunization: Secondary | ICD-10-CM | POA: Diagnosis not present

## 2015-05-11 DIAGNOSIS — N390 Urinary tract infection, site not specified: Secondary | ICD-10-CM | POA: Diagnosis not present

## 2015-05-11 DIAGNOSIS — D649 Anemia, unspecified: Secondary | ICD-10-CM | POA: Diagnosis not present

## 2015-05-11 DIAGNOSIS — I10 Essential (primary) hypertension: Secondary | ICD-10-CM | POA: Diagnosis not present

## 2015-05-11 DIAGNOSIS — I4891 Unspecified atrial fibrillation: Secondary | ICD-10-CM | POA: Diagnosis not present

## 2015-05-11 DIAGNOSIS — M6281 Muscle weakness (generalized): Secondary | ICD-10-CM | POA: Diagnosis not present

## 2015-05-11 DIAGNOSIS — I482 Chronic atrial fibrillation: Secondary | ICD-10-CM | POA: Diagnosis not present

## 2015-05-11 DIAGNOSIS — R5381 Other malaise: Secondary | ICD-10-CM | POA: Diagnosis not present

## 2015-05-11 DIAGNOSIS — E039 Hypothyroidism, unspecified: Secondary | ICD-10-CM | POA: Diagnosis not present

## 2015-05-11 DIAGNOSIS — R2681 Unsteadiness on feet: Secondary | ICD-10-CM | POA: Diagnosis not present

## 2015-05-11 LAB — BASIC METABOLIC PANEL
Anion gap: 8 (ref 5–15)
BUN: 17 mg/dL (ref 6–20)
CALCIUM: 9.1 mg/dL (ref 8.9–10.3)
CO2: 30 mmol/L (ref 22–32)
CREATININE: 0.97 mg/dL (ref 0.44–1.00)
Chloride: 106 mmol/L (ref 101–111)
GFR, EST NON AFRICAN AMERICAN: 53 mL/min — AB (ref >60)
Glucose, Bld: 138 mg/dL — ABNORMAL HIGH (ref 65–99)
Potassium: 3.8 mmol/L (ref 3.5–5.1)
SODIUM: 144 mmol/L (ref 135–145)

## 2015-05-11 MED ORDER — DILTIAZEM HCL ER COATED BEADS 120 MG PO CP24: 120.0000 mg | ORAL_CAPSULE | Freq: Every day | ORAL | Status: DC

## 2015-05-11 NOTE — Progress Notes (Signed)
Report called to [facility] and given to [place]. Answered all questions. Pt will be transported by family to the facility.  Cathie Hoops, RN

## 2015-05-11 NOTE — Progress Notes (Signed)
Pt dc to [facility].  BSW facilitated dc including faxing pt dc summary via TLC to [facility], discussing pt and pt son at bedside, providing RN phone number to call Report, and providing dc packet to pt and pt son at bedside.  Pt son to transport pt via private vehicle.  No further SW needs at this time.  BSW Inter signing off, Harlon Flor, [place] Intern

## 2015-05-11 NOTE — Clinical Social Work Placement (Signed)
   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  No   Patient/family informed of their freedom to choose among providers that offer the needed level of care, that participate in Medicare, Medicaid or managed care program needed by the patient, have an available bed and are willing to accept the patient.  No   Patient/family informed of [place]'s ownership interest in [facility] and [facility], as well as of the fact that they are under no obligation to receive care at these facilities.  PASRR submitted to EDS on 05/11/15     PASRR number received on 05/11/15     Existing PASRR number confirmed on       FL2 transmitted to all facilities in geographic area requested by pt/family on 05/11/15     FL2 transmitted to all facilities within larger geographic area on       Patient informed that his/her managed care company has contracts with or will negotiate with certain facilities, including the following:        Yes   Patient/family informed of bed offers received.  Patient chooses bed at [facility]     Physician recommends and patient chooses bed at      Patient to be transferred to [facility] on 05/11/15.  Patient to be transferred to facility by pt family via private vehicle     Patient family notified on 05/11/15 of transfer.  Name of family member notified:  pt and pt son and pt daughter notified at bedside      PHYSICIAN Please sign FL2     Additional Comment:    _______________________________________________ Ladell Pier, LCSW 05/11/2015, 4:24 PM

## 2015-05-11 NOTE — Progress Notes (Signed)
Physical Therapy Treatment Patient Details Name: MORENIKE CUFF MRN: [id number] DOB: 1930-07-20 Today's Date: 04-Jun-2015    History of Present Illness 81 yo female adm with sepsis d/t UTI; PMHx: HTN    PT Comments    Pt progressing well; able to maneuver in room and tighter spaces (in bathroom) without LOB; pt is motivated to work with PT, wants to go home--family concerned about her being home alone and want her to go to rehab;    Follow Up Recommendations  Home health PT;SNF (family prefers SNF)     Equipment Recommendations  None recommended by PT    Recommendations for Other Services       Precautions / Restrictions Restrictions Weight Bearing Restrictions: No    Mobility  Bed Mobility               General bed mobility comments: in chair  Transfers Overall transfer level: Needs assistance Equipment used: None;Rolling walker (2 wheeled) Transfers: Sit to/from Stand Sit to Stand: Supervision;Modified independent (Device/Increase time)         General transfer comment: cues initially for hand placement and safety, pt demo's good hand placement and self corrects end of session  Ambulation/Gait Ambulation/Gait assistance: Supervision Ambulation Distance (Feet): 180 Feet (10' without RW and supervision) Assistive device: Rolling walker (2 wheeled);None Gait Pattern/deviations: Step-through pattern;Trunk flexed     General Gait Details: cues for step length and posture   Stairs            Wheelchair Mobility    Modified Rankin (Stroke Patients Only)       Balance     Sitting balance-Leahy Scale: Normal       Standing balance-Leahy Scale: Good               High level balance activites: Direction changes;Backward walking      Cognition Arousal/Alertness: Awake/alert Behavior During Therapy: WFL for tasks assessed/performed Overall Cognitive Status: Within Functional Limits for tasks assessed                       Exercises      General Comments        Pertinent Vitals/Pain Pain Assessment: No/denies pain    Home Living                      Prior Function            PT Goals (current goals can now be found in the care plan section) Acute Rehab PT Goals Patient Stated Goal: home when stronger Time For Goal Achievement: 05/23/15 Potential to Achieve Goals: Good Progress towards PT goals: Progressing toward goals    Frequency  Min 3X/week    PT Plan Current plan remains appropriate    Co-evaluation             End of Session Equipment Utilized During Treatment: Gait belt Activity Tolerance: Patient tolerated treatment well Patient left: with call bell/phone within reach;in chair;with family/visitor present     Time: 1201-1216 PT Time Calculation (min) (ACUTE ONLY): 15 min  Charges:  $Gait Training: 8-22 mins                    G Codes:      WILLIAMS,TARA 06-04-15, 1:22 PM

## 2015-05-11 NOTE — Discharge Summary (Signed)
Physician Discharge Summary  Michelle Jones DZH:[id number] DOB: August 06, 1930 DOA: 05/05/2015  PCP: Vena Austria, MD  Admit date: 05/05/2015 Discharge date: 05/11/2015  Time spent: 30 minutes  Recommendations for Outpatient Follow-up:  1. Follow up with PCP in one week.  2. Please follow up with bmp in 2 days.  3. Plan to resume the lisinopril and lasix ina  Week.   Discharge Diagnoses:  Principal Problem:   Sepsis Active Problems:   UTI (urinary tract infection)   Essential hypertension   Hypothyroidism   Depression   AKI (acute kidney injury)   Thrombocytopenia   Hypokalemia   Elevated troponin   Anemia   E. coli sepsis   Discharge Condition: improved  Diet recommendation: low sodium diet.   Filed Weights   05/05/15 1142 05/06/15 0400 05/08/15 0400  Weight: 74.39 kg (164 lb) 76.4 kg (168 lb 6.9 oz) 76.9 kg (169 lb 8.5 oz)    History of present illness:  81 yo , never smoker/drinker who was admitted to [facility] service on 9/23 with nausea / chills and new onset confusion. She was septic with elevated lactic acid and mild troponin leak, found to have a UTI as a source of her sepsis. Her course was complicated by brief A fib with RVR which resolved with Cardizem gtt. TRH took pver patient on 9/27.  Hospital Course:  Sepsis due to UTI - On admission patient was started on vancomycin and aztreonam for sepsis, once microbiology from urine culture speciated, her vancomycin was discontinued. Completed 5 days of antibiotics.  - she is penicillin allergic, she had a "mouth reaction" with penicillin but hasn't really had any other antibiotics recently that she couldn't remember. Microbiology shows unfortunately resistance to Bactrim as well as fluoroquinolones. She completed the course of antibiotics.     Acute hypoxic respiratory failure - Like in the setting of sepsis and fluid resuscitation, resolved. She is on RA and has good oxygen sats.  - Chest x-ray on 9/26 showed  possible interstitial edema, now seems to be improving as she was able to be weaned off on room air. Resume lasix in a week as per the discretion of PCP.     Acute kidney injury - Unknown baseline creatinine, overall stable with mild creatinine bump on 9/27, likely in the setting of sepsis as well as diuresis, continue to monitor renal function - Hold her home lisinopril . Her renal parameters are stable this am.  - plan to resume her lisinopril and lasix in one week if her renal parameters are stable.   A. fib with RVR - A one-time episode in the setting of sepsis, she converted back to sinus rhythm, unless A. fib recurs will hold anticoagulation for now. Continue Cardizem as well as metoprolol. - Currently she is in sinus rhythm,.  Hypertension - Continue metoprolol and Cardizem, hold lisinopril,. Well controlled.   Mild troponin elevation - Overall flat trend, no chest pain, this is likely demand ischemia  Anemia / thrombocytopenia - Worsened probably in the setting of sepsis, improved.  Recheck in one week.   Hypothyroidism - Continue Synthroid  Hypokalemia - Repleted today.    Hypophosphatemia: - repleted as needed..    Procedures:  none  Consultations:  none  Discharge Exam: Filed Vitals:   05/11/15 1409  BP: 131/55  Pulse: 90  Temp: 97.7 F (36.5 C)  Resp: 20    General: alert afebrile comfortable Cardiovascular: s1s2 Respiratory: ctab  Discharge Instructions   Discharge Instructions  Diet - low sodium heart healthy    Complete by:  As directed      Discharge instructions    Complete by:  As directed   Follow up with PCP in one week.  We are holding the lasix and lisinopril for a week , plan to resume it in a week.          Current Discharge Medication List    START taking these medications   Details  diltiazem (CARDIZEM CD) 120 MG 24 hr capsule Take 1 capsule (120 mg total) by mouth daily. Qty: 30 capsule, Refills: 0       CONTINUE these medications which have NOT CHANGED   Details  acetaminophen (TYLENOL) 325 MG tablet Take 650 mg by mouth every 6 (six) hours as needed for moderate pain.    buPROPion (WELLBUTRIN XL) 300 MG 24 hr tablet Take 300 mg by mouth daily.     DULoxetine (CYMBALTA) 60 MG capsule Take 60 mg by mouth daily.     levothyroxine (SYNTHROID, LEVOTHROID) 100 MCG tablet Take 100 mcg by mouth daily before breakfast.     metoprolol succinate (TOPROL-XL) 100 MG 24 hr tablet Take 100 mg by mouth daily.     ranitidine (ZANTAC) 300 MG tablet Take 300 mg by mouth at bedtime.       STOP taking these medications     amLODipine (NORVASC) 10 MG tablet      Diphenhydramine-Acetaminophen (PERCOGESIC PO)      furosemide (LASIX) 20 MG tablet      lisinopril-hydrochlorothiazide (PRINZIDE,ZESTORETIC) 20-25 MG per tablet        Allergies  Allergen Reactions  . Penicillins     Has patient had a PCN reaction causing immediate rash, facial/tongue/throat swelling, SOB or lightheadedness with hypotension: no throat swelling or SOB Has patient had a PCN reaction causing severe rash involving mucus membranes or skin necrosis: unknown, patients states she had sores on her mouth Has patient had a PCN reaction that required hospitalization: no Has patient had a PCN reaction occurring within the last 10 years: no If all of the above answers are "NO", then may proceed with Cephalosp   Follow-up Information    Follow up with Vena Austria, MD. Schedule an appointment as soon as possible for a visit in 1 week.   Specialty:  Family Medicine   Contact information:   [place] [id number] [phone]        The results of significant diagnostics from this hospitalization (including imaging, microbiology, ancillary and laboratory) are listed below for reference.    Significant Diagnostic Studies: Dg Chest 2 View  05/05/2015   CLINICAL DATA:  Initial encounter for  fever and dizziness.  EXAM: CHEST  2 VIEW  COMPARISON:  04/01/2005  FINDINGS: Two views study shows pulmonary vascular congestion with increased interstitial opacities suggesting edema. Cardiopericardial silhouette is at upper limits of normal for size. No evidence for pleural effusion. No focal airspace consolidation. Imaged bony structures of the thorax are intact.  IMPRESSION: Vascular congestion with interstitial pulmonary edema.   Electronically Signed   By: Misty Stanley M.D.   On: 05/05/2015 10:18   Dg Chest Port 1 View  05/08/2015   CLINICAL DATA:  Followup pulmonary edema.  EXAM: PORTABLE CHEST 1 VIEW  COMPARISON:  05/08/2015  FINDINGS: Cardiomegaly with vascular congestion. Diffuse interstitial prominence, similar to prior study. More confluent opacity at the left lung base could reflect atelectasis or infiltrate.  IMPRESSION: Stable  diffuse interstitial prominence, likely interstitial edema. Increasing left lower lobe atelectasis or infiltrate.   Electronically Signed   By: Rolm Baptise M.D.   On: 05/08/2015 12:22   Dg Chest Port 1 View  05/08/2015   CLINICAL DATA:  Acute respiratory failure, sepsis, history of hypertension, nonsmoker.  EXAM: PORTABLE CHEST 1 VIEW  COMPARISON:  Portable chest x-ray of May 07, 2015 and PA and lateral chest x-ray of May 05, 2015.  FINDINGS: The lungs are adequately inflated. The interstitial markings remain increased bilaterally. There is subsegmental atelectasis in the left lower lobe The cardiac silhouette is normal in size. The central pulmonary vascularity is prominent. There is no pleural effusion. The bony thorax exhibits no acute abnormalities.  IMPRESSION: Persistently increased interstitial markings bilaterally consistent with edema or pneumonia. Left lower lobe subsegmental atelectasis persists as well. There has been deterioration in the appearance of the pulmonary interstitium since the study of 23 September.   Electronically Signed   By: David   Martinique M.D.   On: 05/08/2015 08:10   Dg Chest Port 1 View  05/07/2015   CLINICAL DATA:  Acute respiratory failure.  EXAM: PORTABLE CHEST 1 VIEW  COMPARISON:  05/06/2015  FINDINGS: Mild cardiomegaly. Diffuse interstitial prominence throughout the lungs, likely mild edema. No significant change. No visible effusions or acute bony abnormality.  IMPRESSION: Stable mild interstitial edema/ CHF.   Electronically Signed   By: Rolm Baptise M.D.   On: 05/07/2015 07:23   Dg Chest Port 1 View  05/06/2015   CLINICAL DATA:  Patient with episodes of SVT.  Shortness of breath.  EXAM: PORTABLE CHEST 1 VIEW  COMPARISON:  Chest radiograph 05/05/2015  FINDINGS: Monitoring leads overlie the patient. Stable enlarged cardiac and mediastinal contours. Bilateral predominately perihilar interstitial opacities.  IMPRESSION: Mild interstitial pulmonary edema.   Electronically Signed   By: Lovey Newcomer M.D.   On: 05/06/2015 14:18    Microbiology: Recent Results (from the past 240 hour(s))  Culture, blood (routine x 2)     Status: None   Collection Time: 05/05/15  9:46 AM  Result Value Ref Range Status   Specimen Description BLOOD RIGHT FOREARM  Final   Special Requests BOTTLES DRAWN AEROBIC AND ANAEROBIC 5CC  Final   Culture  Setup Time   Final    GRAM NEGATIVE COCCOBACILLI ANAEROBIC BOTTLE ONLY CRITICAL RESULT CALLED TO, READ BACK BY AND VERIFIED WITH: K CHEEK 05/06/15 @ 70 M VESTAL    Culture   Final    ESCHERICHIA COLI Performed at [facility]    Report Status 05/08/2015 FINAL  Final   Organism ID, Bacteria ESCHERICHIA COLI  Final      Susceptibility   Escherichia coli - MIC*    AMPICILLIN <=2 SENSITIVE Sensitive     CEFAZOLIN <=4 SENSITIVE Sensitive     CEFEPIME <=1 SENSITIVE Sensitive     CEFTAZIDIME <=1 SENSITIVE Sensitive     CEFTRIAXONE <=1 SENSITIVE Sensitive     CIPROFLOXACIN >=4 RESISTANT Resistant     GENTAMICIN 4 SENSITIVE Sensitive     IMIPENEM 1 SENSITIVE Sensitive     TRIMETH/SULFA  >=320 RESISTANT Resistant     AMPICILLIN/SULBACTAM <=2 SENSITIVE Sensitive     PIP/TAZO <=4 SENSITIVE Sensitive     * ESCHERICHIA COLI  Culture, blood (routine x 2)     Status: None   Collection Time: 05/05/15 10:15 AM  Result Value Ref Range Status   Specimen Description BLOOD RIGHT FOREARM  Final   Special Requests BOTTLES  DRAWN AEROBIC AND ANAEROBIC 5CC  Final   Culture   Final    NO GROWTH 5 DAYS Performed at [facility]    Report Status 05/10/2015 FINAL  Final  Urine culture     Status: None   Collection Time: 05/05/15 10:43 AM  Result Value Ref Range Status   Specimen Description URINE, CATHETERIZED  Final   Special Requests NONE  Final   Culture   Final    MULTIPLE SPECIES PRESENT, SUGGEST RECOLLECTION Performed at [facility]    Report Status 05/07/2015 FINAL  Final  MRSA PCR Screening     Status: None   Collection Time: 05/05/15  7:50 PM  Result Value Ref Range Status   MRSA by PCR NEGATIVE NEGATIVE Final    Comment:        The GeneXpert MRSA Assay (FDA approved for NASAL specimens only), is one component of a comprehensive MRSA colonization surveillance program. It is not intended to diagnose MRSA infection nor to guide or monitor treatment for MRSA infections.      Labs: Basic Metabolic Panel:  Recent Labs Lab 05/06/15 0015 05/07/15 0335 05/08/15 0520 05/09/15 0355 05/10/15 0458 05/11/15 1038  NA 138 142 141 140 140 144  K 4.1 3.3* 3.8 3.9 3.4* 3.8  CL 108 110 108 107 107 106  CO2 22 25 23 26 27 30   GLUCOSE 106* 110* 124* 138* 120* 138*  BUN 31* 21* 14 17 18 17   CREATININE 1.33* 1.01* 1.02* 1.23* 0.99 0.97  CALCIUM 8.3* 8.4* 8.6* 8.7* 8.5* 9.1  MG 1.7 1.9  --   --   --   --   PHOS  --  2.3*  --   --   --   --    Liver Function Tests:  Recent Labs Lab 05/05/15 0946  AST 38  ALT 22  ALKPHOS 79  BILITOT 0.4  PROT 7.2  ALBUMIN 3.8   No results for input(s): LIPASE, AMYLASE in the last 168 hours. No results for  input(s): AMMONIA in the last 168 hours. CBC:  Recent Labs Lab 05/05/15 0946 05/07/15 0335 05/08/15 0520 05/09/15 0355 05/10/15 0458  WBC 8.8 19.9* 14.3* 10.5 7.4  NEUTROABS 8.2*  --   --   --   --   HGB 10.8* 9.9* 10.4* 11.0* 10.5*  HCT 33.0* 29.8* 31.5* 33.2* 31.9*  MCV 97.1 96.4 94.6 95.4 96.1  PLT 151 111* 134* 155 178   Cardiac Enzymes:  Recent Labs Lab 05/06/15 1750 05/06/15 2315 05/07/15 2315 05/08/15 0520 05/08/15 1053  TROPONINI 0.19* 0.13* 0.08* 0.06* 0.06*   BNP: BNP (last 3 results)  Recent Labs  05/05/15 0946  BNP 385.9*    ProBNP (last 3 results) No results for input(s): PROBNP in the last 8760 hours.  CBG: No results for input(s): GLUCAP in the last 168 hours.     SignedHosie Poisson  [facility] 05/11/2015, 3:59 PM

## 2015-05-11 NOTE — Clinical Social Work Note (Signed)
Clinical Social Work Assessment  Patient Details  Name: Michelle Jones MRN: [id number] Date of Birth: 09/11/1929  Date of referral:  05/11/15               Reason for consult:  Discharge Planning                Permission sought to share information with:  Family Supports Permission granted to share information::  Yes, Verbal Permission Granted  Name::     Carlota Raspberry  Agency::     Relationship::  daughter  Contact Information:  [phone]  Housing/Transportation Living arrangements for the past 2 months:  [place] of Information:  Patient, Adult Children Patient Interpreter Needed:  None Criminal Activity/Legal Involvement Pertinent to Current Situation/Hospitalization:  No - Comment as needed Significant Relationships:  Adult Children Lives with:  Self Do you feel safe going back to the place where you live?  No Need for family participation in patient care:  Yes (Comment)  Care giving concerns:  Pt lives alone. Plan for discharge changed from home health to SNF.    Social Worker assessment / plan:  CSW met with pt and pt son and pt daughter at bedside.   CSW introduced self and explained role. Pt reports that she lives at home alone. CSW discussed recommendation for short term rehab. Pt states that her family is encouraging her to go to rehab at [facility]. Pt agreeable. Pt agreeable to [facility] search. Pt prefers [facility] as it is close to pt son. Pt and pt children are agreeable to pt transitioning to [facility] today if there is a bed available.   CSW completed FL2 and initiated SNF search to [facility]. CSW contacted [facility] admissions coordinator and [place] reviewed information and confirmed that they could offer pt a bed and could accept pt today.  CSW notified pt and pt family at bedside and they are agreeable to transition to [facility] today. Pt family can provide transport for pt to facility.   CSW to  facilitate pt discharge needs.   Employment status:  Retired Forensic scientist:  Medicare PT Recommendations:  [place] / Referral to community resources:  [place]  Patient/Family's Response to care:  Pt alert and oriented x 4. Pt agreeable to SNF. Pt and pt family pleased that [facility] can accept pt.   Patient/Family's Understanding of and Emotional Response to Diagnosis, Current Treatment, and Prognosis:  Pt and pt family aware that pt is medically ready for discharge today.  Emotional Assessment Appearance:  Appears stated age Attitude/Demeanor/Rapport:  Other (pt appropriate) Affect (typically observed):  Appropriate Orientation:  Oriented to Self, Oriented to Place, Oriented to  Time, Oriented to Situation Alcohol / Substance use:  Not Applicable Psych involvement (Current and /or in the community):  No (Comment)  Discharge Needs  Concerns to be addressed:  Discharge Planning Concerns Readmission within the last 30 days:  No Current discharge risk:  Lives alone Barriers to Discharge:  No Barriers Identified   [place], [place], LCSW 05/11/2015, 4:11 PM [phone]

## 2015-05-16 ENCOUNTER — Encounter: Payer: Self-pay | Admitting: Internal Medicine

## 2015-05-16 NOTE — Progress Notes (Deleted)
Patient ID: Michelle Jones, female   DOB: 05-28-1930, 81 y.o.   MRN: [id number]    [place] [facility]  PCP: Vena Austria, MD  Code Status: DNR  Allergies  Allergen Reactions  . Penicillins     Has patient had a PCN reaction causing immediate rash, facial/tongue/throat swelling, SOB or lightheadedness with hypotension: no throat swelling or SOB Has patient had a PCN reaction causing severe rash involving mucus membranes or skin necrosis: unknown, patients states she had sores on her mouth Has patient had a PCN reaction that required hospitalization: no Has patient had a PCN reaction occurring within the last 10 years: no If all of the above answers are "NO", then may proceed with Cephalosp    Chief Complaint  Patient presents with  . New Admit To SNF    New Admission      HPI:  81 y.o. patient is here for short term rehabilitation post hospital admission from   Review of Systems:  Constitutional: Negative for fever, chills, malaise/fatigue and diaphoresis.  HENT: Negative for headache, congestion, nasal discharge, hearing loss, earache, sore throat, difficulty swallowing.   Eyes: Negative for eye pain, blurred vision, double vision and discharge.  Respiratory: Negative for cough, shortness of breath and wheezing.   Cardiovascular: Negative for chest pain, palpitations, leg swelling.  Gastrointestinal: Negative for heartburn, nausea, vomiting, abdominal pain, loss of appetite, melena, diarrhea and constipation.  Genitourinary: Negative for dysuria, urgency, frequency, hematuria, incontinence and flank pain.  Musculoskeletal: Negative for back pain, falls, joint pain and myalgias. assistive device used Skin: Negative for itching, sores and rash.  Neurological: Negative for weakness,dizziness, tingling, focal weakness Psychiatric/Behavioral: Negative for depression, anxiety, insomnia and memory loss.    Past Medical History  Diagnosis Date  . Hypertension     . GERD (gastroesophageal reflux disease)    Past Surgical History  Procedure Laterality Date  . Appendectomy    . Tonsillectomy  1940s  . Carpel tunnel release  2002    Dr. Theodis Sato  . Vaginal hysterectomy      ovaries left in place??? patient on premarin postop despite premenopausal age   Social History:   reports that she has never smoked. She does not have any smokeless tobacco history on file. She reports that she does not drink alcohol or use illicit drugs.  Family History  Problem Relation Age of Onset  . Melanoma Mother   . Breast cancer Sister   . Diabetes Brother     Medications:   Medication List       This list is accurate as of: 05/16/15 10:15 AM.  Always use your most recent med list.               acetaminophen 325 MG tablet  Commonly known as:  TYLENOL  Take 650 mg by mouth every 6 (six) hours as needed for moderate pain.     buPROPion 300 MG 24 hr tablet  Commonly known as:  WELLBUTRIN XL  Take 300 mg by mouth daily.     diltiazem 120 MG 24 hr capsule  Commonly known as:  CARDIZEM CD  Take 1 capsule (120 mg total) by mouth daily.     DULoxetine 60 MG capsule  Commonly known as:  CYMBALTA  Take 60 mg by mouth daily.     levothyroxine 100 MCG tablet  Commonly known as:  SYNTHROID, LEVOTHROID  Take 100 mcg by mouth daily before breakfast.     metoprolol succinate 100  MG 24 hr tablet  Commonly known as:  TOPROL-XL  Take 100 mg by mouth daily.     ranitidine 300 MG tablet  Commonly known as:  ZANTAC  Take 300 mg by mouth at bedtime.         Physical Exam: *** Filed Vitals:   05/16/15 1012  BP: 139/73  Pulse: 87  Temp: 98.6 F (37 C)  TempSrc: Oral  Resp: 20    General- elderly female, well built, in no acute distress Head- normocephalic, atraumatic Ears- left ear normal tympanic membrane and normal external ear canal , right ear normal tympanic membrane and normal external ear canal Nose- normal nasal mucosa, no maxillary  or frontal sinus tenderness, no nasal discharge Throat- moist mucus membrane, normal oropharynx, dentition is  Eyes- PERRLA, EOMI, no pallor, no icterus, no discharge, normal conjunctiva, normal sclera Neck- no cervical lymphadenopathy, no supraclavicular lymphadenopathy, no thyromegaly, no jugular vein distension, no carotid bruit Chest- no chest wall deformities, no chest wall tenderness Breast- normal appearance, no masses or lumps on palpation, normal nipple and areola exam, no axillary lymphadenopathy Cardiovascular- normal s1,s2, no murmurs/ rubs/ gallops, dorsalis pedis and radial pulses, leg edema Respiratory- bilateral clear to auscultation, no wheeze, no rhonchi, no crackles, no use of accessory muscles Abdomen- bowel sounds present, soft, non tender, no organomegaly, no abdominal bruits, no guarding or rigidity, no CVA tenderness Pelvic exam- normal pelvic exam, no adenexal or cervical motion tenderness Musculoskeletal- able to move all 4 extremities, no spinal and paraspinal tenderness, normal back curvature, steady gait, no use of assistive device, normal range of motion Neurological- no focal deficit, alert and oriented to person, place and time, normal reflexes, normal muscle strength, normal sensation to fine touch and vibration Skin- warm and dry Nails- hypertrophy, ingrown Psychiatry- normal mood and affect    Labs reviewed: Basic Metabolic Panel:  Recent Labs  05/06/15 0015 05/07/15 0335  05/09/15 0355 05/10/15 0458 05/11/15 1038  NA 138 142  < > 140 140 144  K 4.1 3.3*  < > 3.9 3.4* 3.8  CL 108 110  < > 107 107 106  CO2 22 25  < > 26 27 30   GLUCOSE 106* 110*  < > 138* 120* 138*  BUN 31* 21*  < > 17 18 17   CREATININE 1.33* 1.01*  < > 1.23* 0.99 0.97  CALCIUM 8.3* 8.4*  < > 8.7* 8.5* 9.1  MG 1.7 1.9  --   --   --   --   PHOS  --  2.3*  --   --   --   --   < > = values in this interval not displayed. Liver Function Tests:  Recent Labs  05/05/15 0946  AST 38   ALT 22  ALKPHOS 79  BILITOT 0.4  PROT 7.2  ALBUMIN 3.8   No results for input(s): LIPASE, AMYLASE in the last 8760 hours. No results for input(s): AMMONIA in the last 8760 hours. CBC:  Recent Labs  05/05/15 0946  05/08/15 0520 05/09/15 0355 05/10/15 0458  WBC 8.8  < > 14.3* 10.5 7.4  NEUTROABS 8.2*  --   --   --   --   HGB 10.8*  < > 10.4* 11.0* 10.5*  HCT 33.0*  < > 31.5* 33.2* 31.9*  MCV 97.1  < > 94.6 95.4 96.1  PLT 151  < > 134* 155 178  < > = values in this interval not displayed. Cardiac Enzymes:  Recent Labs  05/07/15  2315 05/08/15 0520 05/08/15 1053  TROPONINI 0.08* 0.06* 0.06*   BNP: Invalid input(s): POCBNP CBG: No results for input(s): GLUCAP in the last 8760 hours.  Radiological Exams: Dg Chest 2 View  05/05/2015   CLINICAL DATA:  Initial encounter for fever and dizziness.  EXAM: CHEST  2 VIEW  COMPARISON:  04/01/2005  FINDINGS: Two views study shows pulmonary vascular congestion with increased interstitial opacities suggesting edema. Cardiopericardial silhouette is at upper limits of normal for size. No evidence for pleural effusion. No focal airspace consolidation. Imaged bony structures of the thorax are intact.  IMPRESSION: Vascular congestion with interstitial pulmonary edema.   Electronically Signed   By: Misty Stanley M.D.   On: 05/05/2015 10:18   Dg Chest Port 1 View  05/06/2015   CLINICAL DATA:  Patient with episodes of SVT.  Shortness of breath.  EXAM: PORTABLE CHEST 1 VIEW  COMPARISON:  Chest radiograph 05/05/2015  FINDINGS: Monitoring leads overlie the patient. Stable enlarged cardiac and mediastinal contours. Bilateral predominately perihilar interstitial opacities.  IMPRESSION: Mild interstitial pulmonary edema.   Electronically Signed   By: Lovey Newcomer M.D.   On: 05/06/2015 14:18    EKG: Independently reviewed. ***  Assessment/Plan No problem-specific assessment & plan notes found for this encounter.      Goals of care: short term  rehabilitation   Labs/tests ordered:  Family/ staff Communication: reviewed care plan with patient and nursing supervisor    Blanchie Serve, MD  [facility] [phone] (Monday-Friday 8 am - 5 pm) [phone] (afterhours)

## 2015-05-17 ENCOUNTER — Encounter: Payer: Self-pay | Admitting: Internal Medicine

## 2015-05-17 ENCOUNTER — Non-Acute Institutional Stay (SKILLED_NURSING_FACILITY): Payer: Medicare Other | Admitting: Internal Medicine

## 2015-05-17 DIAGNOSIS — F329 Major depressive disorder, single episode, unspecified: Secondary | ICD-10-CM | POA: Diagnosis not present

## 2015-05-17 DIAGNOSIS — I10 Essential (primary) hypertension: Secondary | ICD-10-CM

## 2015-05-17 DIAGNOSIS — I4891 Unspecified atrial fibrillation: Secondary | ICD-10-CM

## 2015-05-17 DIAGNOSIS — E039 Hypothyroidism, unspecified: Secondary | ICD-10-CM | POA: Diagnosis not present

## 2015-05-17 DIAGNOSIS — N390 Urinary tract infection, site not specified: Secondary | ICD-10-CM

## 2015-05-17 DIAGNOSIS — B9620 Unspecified Escherichia coli [E. coli] as the cause of diseases classified elsewhere: Secondary | ICD-10-CM | POA: Diagnosis not present

## 2015-05-17 DIAGNOSIS — K219 Gastro-esophageal reflux disease without esophagitis: Secondary | ICD-10-CM

## 2015-05-17 DIAGNOSIS — R600 Localized edema: Secondary | ICD-10-CM | POA: Diagnosis not present

## 2015-05-17 DIAGNOSIS — D649 Anemia, unspecified: Secondary | ICD-10-CM | POA: Diagnosis not present

## 2015-05-17 DIAGNOSIS — R5381 Other malaise: Secondary | ICD-10-CM | POA: Diagnosis not present

## 2015-05-17 DIAGNOSIS — F32A Depression, unspecified: Secondary | ICD-10-CM

## 2015-05-17 NOTE — Progress Notes (Signed)
Patient ID: Michelle Jones, female   DOB: 11-Mar-1930, 81 y.o.   MRN: [id number]       [place]   PCP: Vena Austria, MD  Code Status: DNR  Allergies  Allergen Reactions  . Penicillins     Has patient had a PCN reaction causing immediate rash, facial/tongue/throat swelling, SOB or lightheadedness with hypotension: no throat swelling or SOB Has patient had a PCN reaction causing severe rash involving mucus membranes or skin necrosis: unknown, patients states she had sores on her mouth Has patient had a PCN reaction that required hospitalization: no Has patient had a PCN reaction occurring within the last 10 years: no If all of the above answers are "NO", then may proceed with Cephalosp    Chief Complaint  Patient presents with  . New Admit To SNF    New Admit     HPI:  81 y.o. patient is here for short term rehabilitation post hospital admission from 05/05/15-05/11/15 with altered mental status in setting of sepsis from e.coli UTI. She had afib with RVR this admission as well. She has completed antibiotics and her rate was controlled with rate controlling agent. She is seen in her room today. She has noticed swelling in her legs. Denies any other concerns. She feels that her strength in improving. Walking with help of a walker.   Review of Systems:  Constitutional: Negative for fever, chills, diaphoresis.  HENT: Negative for headache, congestion, nasal discharge Eyes: Negative for eye pain, blurred vision, double vision and discharge.  Respiratory: Negative for cough, shortness of breath and wheezing.   Cardiovascular: Negative for chest pain, palpitations  Gastrointestinal: Negative for heartburn, nausea, vomiting, abdominal pain. Bowel movement yesterday Genitourinary: Negative for dysuria, flank pain.  Musculoskeletal: Negative for back pain, falls in facility Skin: Negative for itching, rash.  Neurological: Negative for dizziness, tingling, focal  weakness Psychiatric/Behavioral: Negative for depression   Past Medical History  Diagnosis Date  . Hypertension   . GERD (gastroesophageal reflux disease)    Past Surgical History  Procedure Laterality Date  . Appendectomy    . Tonsillectomy  1940s  . Carpel tunnel release  2002    Dr. Theodis Sato  . Vaginal hysterectomy      ovaries left in place??? patient on premarin postop despite premenopausal age   Social History:   reports that she has never smoked. She does not have any smokeless tobacco history on file. She reports that she does not drink alcohol or use illicit drugs.  Family History  Problem Relation Age of Onset  . Melanoma Mother   . Breast cancer Sister   . Diabetes Brother     Medications:   Medication List       This list is accurate as of: 05/17/15 10:14 AM.  Always use your most recent med list.               acetaminophen 325 MG tablet  Commonly known as:  TYLENOL  Take 650 mg by mouth every 6 (six) hours as needed for moderate pain.     buPROPion 300 MG 24 hr tablet  Commonly known as:  WELLBUTRIN XL  Take one tablet by mouth once daily for depression     diltiazem 120 MG 24 hr capsule  Commonly known as:  CARDIZEM CD  Take 1 capsule (120 mg total) by mouth daily.     DULoxetine 60 MG capsule  Commonly known as:  CYMBALTA  Take one capsule by  mouth daily for depression     levothyroxine 100 MCG tablet  Commonly known as:  SYNTHROID, LEVOTHROID  Take one tablet by mouth every morning before breakfast for hypothyroidism     metoprolol succinate 100 MG 24 hr tablet  Commonly known as:  TOPROL-XL  Take one tablet by mouth once daily for hypertension     ranitidine 300 MG tablet  Commonly known as:  ZANTAC  Take one tablet by mouth once daily at bedtime for GERD         Physical Exam Filed Vitals:   05/17/15 1004  BP: 139/73  Pulse: 87  Temp: 98.6 F (37 C)  TempSrc: Oral  Resp: 20  Height: 5\' 3"  (1.6 m)  Weight: 169 lb  12.8 oz (77.021 kg)    General- elderly female, well built, in no acute distress Head- normocephalic, atraumatic Nose- normal nasal mucosa, no maxillary or frontal sinus tenderness, no nasal discharge Throat- moist mucus membrane Eyes- PERRLA, EOMI, no pallor, no icterus, no discharge, normal conjunctiva, normal sclera Neck- no cervical lymphadenopathy Cardiovascular- normal s1,s2, no murmurs, 1+ foot and lower leg edema Respiratory- bilateral clear to auscultation, no wheeze, no rhonchi, no crackles, no use of accessory muscles Abdomen- bowel sounds present, soft, non tender Musculoskeletal- able to move all 4 extremities, unsteady gait, generalized weakness  Neurological- no focal deficit, alert and oriented to person, place and time Skin- warm and dry Psychiatry- normal mood and affect    Labs reviewed: Basic Metabolic Panel:  Recent Labs  05/06/15 0015 05/07/15 0335  05/09/15 0355 05/10/15 0458 05/11/15 1038  NA 138 142  < > 140 140 144  K 4.1 3.3*  < > 3.9 3.4* 3.8  CL 108 110  < > 107 107 106  CO2 22 25  < > 26 27 30   GLUCOSE 106* 110*  < > 138* 120* 138*  BUN 31* 21*  < > 17 18 17   CREATININE 1.33* 1.01*  < > 1.23* 0.99 0.97  CALCIUM 8.3* 8.4*  < > 8.7* 8.5* 9.1  MG 1.7 1.9  --   --   --   --   PHOS  --  2.3*  --   --   --   --   < > = values in this interval not displayed. Liver Function Tests:  Recent Labs  05/05/15 0946  AST 38  ALT 22  ALKPHOS 79  BILITOT 0.4  PROT 7.2  ALBUMIN 3.8   No results for input(s): LIPASE, AMYLASE in the last 8760 hours. No results for input(s): AMMONIA in the last 8760 hours. CBC:  Recent Labs  05/05/15 0946  05/08/15 0520 05/09/15 0355 05/10/15 0458  WBC 8.8  < > 14.3* 10.5 7.4  NEUTROABS 8.2*  --   --   --   --   HGB 10.8*  < > 10.4* 11.0* 10.5*  HCT 33.0*  < > 31.5* 33.2* 31.9*  MCV 97.1  < > 94.6 95.4 96.1  PLT 151  < > 134* 155 178  < > = values in this interval not displayed. Cardiac Enzymes:  Recent  Labs  05/07/15 2315 05/08/15 0520 05/08/15 1053  TROPONINI 0.08* 0.06* 0.06*   BNP: Invalid input(s): POCBNP CBG: No results for input(s): GLUCAP in the last 8760 hours.  Radiological Exams: Dg Chest 2 View  05/05/2015   CLINICAL DATA:  Initial encounter for fever and dizziness.  EXAM: CHEST  2 VIEW  COMPARISON:  04/01/2005  FINDINGS: Two views study  shows pulmonary vascular congestion with increased interstitial opacities suggesting edema. Cardiopericardial silhouette is at upper limits of normal for size. No evidence for pleural effusion. No focal airspace consolidation. Imaged bony structures of the thorax are intact.  IMPRESSION: Vascular congestion with interstitial pulmonary edema.   Electronically Signed   By: Misty Stanley M.D.   On: 05/05/2015 10:18   Dg Chest Port 1 View  05/06/2015   CLINICAL DATA:  Patient with episodes of SVT.  Shortness of breath.  EXAM: PORTABLE CHEST 1 VIEW  COMPARISON:  Chest radiograph 05/05/2015  FINDINGS: Monitoring leads overlie the patient. Stable enlarged cardiac and mediastinal contours. Bilateral predominately perihilar interstitial opacities.  IMPRESSION: Mild interstitial pulmonary edema.   Electronically Signed   By: Lovey Newcomer M.D.   On: 05/06/2015 14:18    Assessment/Plan  Physical deconditioning Will have her work with physical therapy and occupational therapy team to help with gait training and muscle strengthening exercises.fall precautions. Skin care. Encourage to be out of bed.   E.coli uti Resolved, antibiotics completed. Monitor clinically, hydration encouraged  afib Rate controlled at present. Continue cardizem 120 mg daily and toprol 100 mg daily  HTN Continue metoprolol. With bp stable, hold off on resuming lisinopril for now. Monitor bp daily  Hypothyroidism Stable, continue syntrhoid 100 mcg daily  Chronic depression Stable mood. Continue bupropion 300 mg daily and cymbalta 60 mg daily  gerd Continue zantac 300 mg  daily  Anemia Check h&h  Leg edema Start lasix 10 mg daily for now and reassess, check bmp 05/22/15   Goals of care: short term rehabilitation   Labs/tests ordered: cbc, cmp 05/22/15  Family/ staff Communication: reviewed care plan with patient and nursing supervisor    Blanchie Serve, MD  [place] [phone] (Monday-Friday 8 am - 5 pm) [phone] (afterhours)

## 2015-05-17 NOTE — Progress Notes (Signed)
This encounter was created in error - please disregard.

## 2015-05-18 DIAGNOSIS — E876 Hypokalemia: Secondary | ICD-10-CM | POA: Diagnosis not present

## 2015-05-18 DIAGNOSIS — Z23 Encounter for immunization: Secondary | ICD-10-CM | POA: Diagnosis not present

## 2015-05-18 DIAGNOSIS — I129 Hypertensive chronic kidney disease with stage 1 through stage 4 chronic kidney disease, or unspecified chronic kidney disease: Secondary | ICD-10-CM | POA: Diagnosis not present

## 2015-05-18 DIAGNOSIS — N179 Acute kidney failure, unspecified: Secondary | ICD-10-CM | POA: Diagnosis not present

## 2015-05-18 DIAGNOSIS — R609 Edema, unspecified: Secondary | ICD-10-CM | POA: Diagnosis not present

## 2015-05-18 DIAGNOSIS — A4151 Sepsis due to Escherichia coli [E. coli]: Secondary | ICD-10-CM | POA: Diagnosis not present

## 2015-05-22 ENCOUNTER — Encounter: Payer: Self-pay | Admitting: Nurse Practitioner

## 2015-05-22 ENCOUNTER — Non-Acute Institutional Stay (SKILLED_NURSING_FACILITY): Payer: Medicare Other | Admitting: Nurse Practitioner

## 2015-05-22 DIAGNOSIS — F329 Major depressive disorder, single episode, unspecified: Secondary | ICD-10-CM | POA: Diagnosis not present

## 2015-05-22 DIAGNOSIS — B9620 Unspecified Escherichia coli [E. coli] as the cause of diseases classified elsewhere: Secondary | ICD-10-CM

## 2015-05-22 DIAGNOSIS — R600 Localized edema: Secondary | ICD-10-CM | POA: Diagnosis not present

## 2015-05-22 DIAGNOSIS — R5381 Other malaise: Secondary | ICD-10-CM | POA: Diagnosis not present

## 2015-05-22 DIAGNOSIS — E039 Hypothyroidism, unspecified: Secondary | ICD-10-CM | POA: Diagnosis not present

## 2015-05-22 DIAGNOSIS — N390 Urinary tract infection, site not specified: Secondary | ICD-10-CM

## 2015-05-22 DIAGNOSIS — I4891 Unspecified atrial fibrillation: Secondary | ICD-10-CM

## 2015-05-22 DIAGNOSIS — F32A Depression, unspecified: Secondary | ICD-10-CM

## 2015-05-22 DIAGNOSIS — I10 Essential (primary) hypertension: Secondary | ICD-10-CM | POA: Diagnosis not present

## 2015-05-22 DIAGNOSIS — D649 Anemia, unspecified: Secondary | ICD-10-CM | POA: Diagnosis not present

## 2015-05-22 NOTE — Progress Notes (Signed)
Patient ID: Michelle Jones, female   DOB: 07/31/1930, 81 y.o.   MRN: [id number]    Nursing Home Location:  [place] of Service: SNF (31)  PCP: Vena Austria, MD  Allergies  Allergen Reactions  . Penicillins     Has patient had a PCN reaction causing immediate rash, facial/tongue/throat swelling, SOB or lightheadedness with hypotension: no throat swelling or SOB Has patient had a PCN reaction causing severe rash involving mucus membranes or skin necrosis: unknown, patients states she had sores on her mouth Has patient had a PCN reaction that required hospitalization: no Has patient had a PCN reaction occurring within the last 10 years: no If all of the above answers are "NO", then may proceed with Cephalosp    Chief Complaint  Patient presents with  . Discharge Note    Discharge from SNF     HPI:  Patient is a 81 y.o. female seen today at [facility] for discharge home. Pt with a pmh of htn, hypothyroid, depression, GERD. Pt at [facility] for short term rehabilitation post hospital admission from 05/05/15-05/11/15 with altered mental status in setting of sepsis from [facility] UTI also noted to have afib with RVR as well. Completed antibiotics in hospital and her rate was controlled with rate controlling agent. Pt was noted to have increased edema to LE and lasix was added as well as home regimen lisinopril/hctz and swelling has improved. Patient currently doing well with therapy, now stable to discharge home with home health.  Review of Systems:  Review of Systems  Constitutional: Negative for activity change, appetite change, fatigue and unexpected weight change.  Eyes: Negative.   Respiratory: Negative for cough and shortness of breath.   Cardiovascular: Negative for chest pain, palpitations and leg swelling.  Gastrointestinal: Negative for abdominal pain, diarrhea and constipation.  Genitourinary: Negative for dysuria and  difficulty urinating.  Musculoskeletal: Negative for myalgias and arthralgias.  Skin: Negative for color change and wound.  Neurological: Negative for dizziness and weakness.  Psychiatric/Behavioral: Negative for behavioral problems, confusion and agitation.    Past Medical History  Diagnosis Date  . Hypertension   . GERD (gastroesophageal reflux disease)    Past Surgical History  Procedure Laterality Date  . Appendectomy    . Tonsillectomy  1940s  . Carpel tunnel release  2002    Dr. Theodis Sato  . Vaginal hysterectomy      ovaries left in place??? patient on premarin postop despite premenopausal age   Social History:   reports that she has never smoked. She does not have any smokeless tobacco history on file. She reports that she does not drink alcohol or use illicit drugs.  Family History  Problem Relation Age of Onset  . Melanoma Mother   . Breast cancer Sister   . Diabetes Brother     Medications: Patient's Medications  New Prescriptions   No medications on file  Previous Medications   ACETAMINOPHEN (TYLENOL) 325 MG TABLET    Take 650 mg by mouth every 6 (six) hours as needed for moderate pain.   BUPROPION (WELLBUTRIN XL) 300 MG 24 HR TABLET    Take one tablet by mouth once daily for depression   DILTIAZEM (CARDIZEM CD) 120 MG 24 HR CAPSULE    Take 1 capsule (120 mg total) by mouth daily.   DULOXETINE (CYMBALTA) 60 MG CAPSULE    Take one capsule by mouth daily for depression   FUROSEMIDE (LASIX)  20 MG TABLET    Take 10 mg by mouth daily.   LEVOTHYROXINE (SYNTHROID, LEVOTHROID) 100 MCG TABLET    Take one tablet by mouth every morning before breakfast for hypothyroidism   LISINOPRIL-HYDROCHLOROTHIAZIDE (PRINZIDE,ZESTORETIC) 20-25 MG TABLET    Take 1 tablet by mouth daily.   METOPROLOL SUCCINATE (TOPROL-XL) 100 MG 24 HR TABLET    Take one tablet by mouth once daily for hypertension   RANITIDINE (ZANTAC) 300 MG TABLET    Take one tablet by mouth once daily at bedtime  for GERD  Modified Medications   No medications on file  Discontinued Medications   No medications on file     Physical Exam: Filed Vitals:   05/22/15 1424  BP: 138/78  Pulse: 74  Temp: 98.1 F (36.7 C)  TempSrc: Oral  Resp: 18    Physical Exam  Constitutional: She is oriented to person, place, and time. She appears well-developed and well-nourished. No distress.  HENT:  Head: Normocephalic and atraumatic.  Eyes: Conjunctivae are normal. Pupils are equal, round, and reactive to light.  Neck: Normal range of motion. Neck supple.  Cardiovascular: Normal rate, regular rhythm and normal heart sounds.   Pulmonary/Chest: Effort normal and breath sounds normal.  Abdominal: Soft. Bowel sounds are normal.  Musculoskeletal: She exhibits no edema or tenderness.  Neurological: She is alert and oriented to person, place, and time.  Skin: Skin is warm and dry. She is not diaphoretic.  Psychiatric: She has a normal mood and affect.    Labs reviewed: Basic Metabolic Panel:  Recent Labs  05/06/15 0015 05/07/15 0335  05/09/15 0355 05/10/15 0458 05/11/15 1038  NA 138 142  < > 140 140 144  K 4.1 3.3*  < > 3.9 3.4* 3.8  CL 108 110  < > 107 107 106  CO2 22 25  < > 26 27 30   GLUCOSE 106* 110*  < > 138* 120* 138*  BUN 31* 21*  < > 17 18 17   CREATININE 1.33* 1.01*  < > 1.23* 0.99 0.97  CALCIUM 8.3* 8.4*  < > 8.7* 8.5* 9.1  MG 1.7 1.9  --   --   --   --   PHOS  --  2.3*  --   --   --   --   < > = values in this interval not displayed. Liver Function Tests:  Recent Labs  05/05/15 0946  AST 38  ALT 22  ALKPHOS 79  BILITOT 0.4  PROT 7.2  ALBUMIN 3.8   No results for input(s): LIPASE, AMYLASE in the last 8760 hours. No results for input(s): AMMONIA in the last 8760 hours. CBC:  Recent Labs  05/05/15 0946  05/08/15 0520 05/09/15 0355 05/10/15 0458  WBC 8.8  < > 14.3* 10.5 7.4  NEUTROABS 8.2*  --   --   --   --   HGB 10.8*  < > 10.4* 11.0* 10.5*  HCT 33.0*  < > 31.5*  33.2* 31.9*  MCV 97.1  < > 94.6 95.4 96.1  PLT 151  < > 134* 155 178  < > = values in this interval not displayed. TSH:  Recent Labs  05/07/15 2315  TSH 3.101   A1C: No results found for: HGBA1C Lipid Panel: No results for input(s): CHOL, HDL, LDLCALC, TRIG, CHOLHDL, LDLDIRECT in the last 8760 hours.  TEST RESULT REF RANGE UNIT White Blood Cell (WBC) 6.2 3.8-10.8 k/uL Red Blood Cell (RBC) 3.5 3.5-5.5 M/uL Hemoglobin (HGB) L 10.8 11.4-16.0  g/dL Hematocrit (HCT) 34.9 33.0-48.0 % Mean Corpuscular Volume (MCV) 99.7 79.0-100.0 fL Mean Corpuscular HGB (MCH) 30.9 26.8-33.2 pg Mean Corpuscular HGB Conc (MCHC) 30.9 30.5-36.0 g/dL RBC Dist Width (RDW) 13.3 9.0-15.0 % Platelet 418 150-450 k/uL  Radiological Exams: Dg Chest 2 View  05/05/2015   CLINICAL DATA:  Initial encounter for fever and dizziness.  EXAM: CHEST  2 VIEW  COMPARISON:  04/01/2005  FINDINGS: Two views study shows pulmonary vascular congestion with increased interstitial opacities suggesting edema. Cardiopericardial silhouette is at upper limits of normal for size. No evidence for pleural effusion. No focal airspace consolidation. Imaged bony structures of the thorax are intact.  IMPRESSION: Vascular congestion with interstitial pulmonary edema.   Electronically Signed   By: Misty Stanley M.D.   On: 05/05/2015 10:18   Dg Chest Port 1 View  05/06/2015   CLINICAL DATA:  Patient with episodes of SVT.  Shortness of breath.  EXAM: PORTABLE CHEST 1 VIEW  COMPARISON:  Chest radiograph 05/05/2015  FINDINGS: Monitoring leads overlie the patient. Stable enlarged cardiac and mediastinal contours. Bilateral predominately perihilar interstitial opacities.  IMPRESSION: Mild interstitial pulmonary edema.   Electronically Signed   By: Lovey Newcomer M.D.   On: 05/06/2015 14:18    Assessment/Plan 1. Bilateral edema of lower extremity Improved, cont on lasix and lisinopril-hydrochlorothiazide 20-25 MG per tablet  2. E-coli UTI Resolved,  no ongoing symptoms  3. Essential hypertension Blood pressure remains stable, conts on lisinopril-hydrochlorothiazide, lasix, Cardizem and metoprolol   4. Hypothyroidism, unspecified hypothyroidism type -conts on synthroid 100 mcg daily   5. Depression -stable, cont on wellburtin 300 mg daily and Cymbalta 60 mg daily   6. Anemia, unspecified anemia type Stable, will need ongoing outpatient follow up.   7. Atrial fibrillation, unspecified type ([place]) Rate controlled on Cardizem and metoprolol, currently in SR and not on anticoagulation unless she converts to a fib again.   8. Physical deconditioning Improving. pt is stable for discharge-will need PT/O/HHAid per home health. No DME needed. Rx written.  will need to follow up with PCP within 2 weeks.     Carlos American. Harle Battiest  [facility] [phone] 8 am - 5 pm) [phone] (after hours)

## 2015-05-22 NOTE — Progress Notes (Deleted)
Patient ID: Michelle Jones, female   DOB: 1929-12-16, 81 y.o.   MRN: [id number]    Nursing Home Location:  [place] of Service: SNF (31)  PCP: Vena Austria, MD  Allergies  Allergen Reactions  . Penicillins     Has patient had a PCN reaction causing immediate rash, facial/tongue/throat swelling, SOB or lightheadedness with hypotension: no throat swelling or SOB Has patient had a PCN reaction causing severe rash involving mucus membranes or skin necrosis: unknown, patients states she had sores on her mouth Has patient had a PCN reaction that required hospitalization: no Has patient had a PCN reaction occurring within the last 10 years: no If all of the above answers are "NO", then may proceed with Cephalosp    Chief Complaint  Patient presents with  . Discharge Note    HPI:  Patient is a 81 y.o. female seen today at [facility] ***  Review of Systems:  Review of Systems  Past Medical History  Diagnosis Date  . Hypertension   . GERD (gastroesophageal reflux disease)    Past Surgical History  Procedure Laterality Date  . Appendectomy    . Tonsillectomy  1940s  . Carpel tunnel release  2002    Dr. Theodis Sato  . Vaginal hysterectomy      ovaries left in place??? patient on premarin postop despite premenopausal age   Social History:   reports that she has never smoked. She does not have any smokeless tobacco history on file. She reports that she does not drink alcohol or use illicit drugs.  Family History  Problem Relation Age of Onset  . Melanoma Mother   . Breast cancer Sister   . Diabetes Brother     Medications: Patient's Medications  New Prescriptions   No medications on file  Previous Medications   ACETAMINOPHEN (TYLENOL) 325 MG TABLET    Take 650 mg by mouth every 6 (six) hours as needed for moderate pain.   BUPROPION (WELLBUTRIN XL) 300 MG 24 HR TABLET    Take one tablet by mouth once daily for  depression   DILTIAZEM (CARDIZEM CD) 120 MG 24 HR CAPSULE    Take 1 capsule (120 mg total) by mouth daily.   DULOXETINE (CYMBALTA) 60 MG CAPSULE    Take one capsule by mouth daily for depression   FUROSEMIDE (LASIX) 20 MG TABLET    Take 10 mg by mouth daily.   LEVOTHYROXINE (SYNTHROID, LEVOTHROID) 100 MCG TABLET    Take one tablet by mouth every morning before breakfast for hypothyroidism   LISINOPRIL-HYDROCHLOROTHIAZIDE (PRINZIDE,ZESTORETIC) 20-25 MG TABLET    Take 1 tablet by mouth daily.   METOPROLOL SUCCINATE (TOPROL-XL) 100 MG 24 HR TABLET    Take one tablet by mouth once daily for hypertension   RANITIDINE (ZANTAC) 300 MG TABLET    Take one tablet by mouth once daily at bedtime for GERD  Modified Medications   No medications on file  Discontinued Medications   No medications on file     Physical Exam: There were no vitals filed for this visit.  Physical Exam  Labs reviewed: Basic Metabolic Panel:  Recent Labs  05/06/15 0015 05/07/15 0335  05/09/15 0355 05/10/15 0458 05/11/15 1038  NA 138 142  < > 140 140 144  K 4.1 3.3*  < > 3.9 3.4* 3.8  CL 108 110  < > 107 107 106  CO2 22 25  < > 26 27  30  GLUCOSE 106* 110*  < > 138* 120* 138*  BUN 31* 21*  < > 17 18 17   CREATININE 1.33* 1.01*  < > 1.23* 0.99 0.97  CALCIUM 8.3* 8.4*  < > 8.7* 8.5* 9.1  MG 1.7 1.9  --   --   --   --   PHOS  --  2.3*  --   --   --   --   < > = values in this interval not displayed. Liver Function Tests:  Recent Labs  05/05/15 0946  AST 38  ALT 22  ALKPHOS 79  BILITOT 0.4  PROT 7.2  ALBUMIN 3.8   No results for input(s): LIPASE, AMYLASE in the last 8760 hours. No results for input(s): AMMONIA in the last 8760 hours. CBC:  Recent Labs  05/05/15 0946  05/08/15 0520 05/09/15 0355 05/10/15 0458  WBC 8.8  < > 14.3* 10.5 7.4  NEUTROABS 8.2*  --   --   --   --   HGB 10.8*  < > 10.4* 11.0* 10.5*  HCT 33.0*  < > 31.5* 33.2* 31.9*  MCV 97.1  < > 94.6 95.4 96.1  PLT 151  < > 134* 155 178    < > = values in this interval not displayed. TSH:  Recent Labs  05/07/15 2315  TSH 3.101   A1C: No results found for: HGBA1C Lipid Panel: No results for input(s): CHOL, HDL, LDLCALC, TRIG, CHOLHDL, LDLDIRECT in the last 8760 hours.  Radiological Exams: ***  Assessment/Plan 1. Bilateral edema of lower extremity ***  2. E-coli UTI ***  3. Essential hypertension ***  4. Hypothyroidism, unspecified hypothyroidism type ***  5. Depression ***  6. Anemia, unspecified anemia type ***  7. Atrial fibrillation, unspecified type (HCC) ***  8. Physical deconditioning ***     Jessica K. Harle Battiest  [facility] [phone] 8 am - 5 pm) [phone] (after hours)

## 2015-05-26 DIAGNOSIS — Z87440 Personal history of urinary (tract) infections: Secondary | ICD-10-CM | POA: Diagnosis not present

## 2015-05-26 DIAGNOSIS — F329 Major depressive disorder, single episode, unspecified: Secondary | ICD-10-CM | POA: Diagnosis not present

## 2015-05-26 DIAGNOSIS — I482 Chronic atrial fibrillation: Secondary | ICD-10-CM | POA: Diagnosis not present

## 2015-05-26 DIAGNOSIS — M6281 Muscle weakness (generalized): Secondary | ICD-10-CM | POA: Diagnosis not present

## 2015-05-26 DIAGNOSIS — I10 Essential (primary) hypertension: Secondary | ICD-10-CM | POA: Diagnosis not present

## 2015-05-29 DIAGNOSIS — F329 Major depressive disorder, single episode, unspecified: Secondary | ICD-10-CM | POA: Diagnosis not present

## 2015-05-29 DIAGNOSIS — Z87440 Personal history of urinary (tract) infections: Secondary | ICD-10-CM | POA: Diagnosis not present

## 2015-05-29 DIAGNOSIS — I10 Essential (primary) hypertension: Secondary | ICD-10-CM | POA: Diagnosis not present

## 2015-05-29 DIAGNOSIS — I482 Chronic atrial fibrillation: Secondary | ICD-10-CM | POA: Diagnosis not present

## 2015-05-29 DIAGNOSIS — M6281 Muscle weakness (generalized): Secondary | ICD-10-CM | POA: Diagnosis not present

## 2015-06-01 DIAGNOSIS — I482 Chronic atrial fibrillation: Secondary | ICD-10-CM | POA: Diagnosis not present

## 2015-06-01 DIAGNOSIS — F329 Major depressive disorder, single episode, unspecified: Secondary | ICD-10-CM | POA: Diagnosis not present

## 2015-06-01 DIAGNOSIS — Z87440 Personal history of urinary (tract) infections: Secondary | ICD-10-CM | POA: Diagnosis not present

## 2015-06-01 DIAGNOSIS — I10 Essential (primary) hypertension: Secondary | ICD-10-CM | POA: Diagnosis not present

## 2015-06-01 DIAGNOSIS — M6281 Muscle weakness (generalized): Secondary | ICD-10-CM | POA: Diagnosis not present

## 2015-06-05 DIAGNOSIS — F329 Major depressive disorder, single episode, unspecified: Secondary | ICD-10-CM | POA: Diagnosis not present

## 2015-06-05 DIAGNOSIS — N3000 Acute cystitis without hematuria: Secondary | ICD-10-CM | POA: Diagnosis not present

## 2015-06-05 DIAGNOSIS — M6281 Muscle weakness (generalized): Secondary | ICD-10-CM | POA: Diagnosis not present

## 2015-06-05 DIAGNOSIS — I951 Orthostatic hypotension: Secondary | ICD-10-CM | POA: Diagnosis not present

## 2015-06-05 DIAGNOSIS — Z87440 Personal history of urinary (tract) infections: Secondary | ICD-10-CM | POA: Diagnosis not present

## 2015-06-05 DIAGNOSIS — I482 Chronic atrial fibrillation: Secondary | ICD-10-CM | POA: Diagnosis not present

## 2015-06-05 DIAGNOSIS — I10 Essential (primary) hypertension: Secondary | ICD-10-CM | POA: Diagnosis not present

## 2015-06-05 DIAGNOSIS — R350 Frequency of micturition: Secondary | ICD-10-CM | POA: Diagnosis not present

## 2015-06-06 DIAGNOSIS — Z87440 Personal history of urinary (tract) infections: Secondary | ICD-10-CM | POA: Diagnosis not present

## 2015-06-06 DIAGNOSIS — M6281 Muscle weakness (generalized): Secondary | ICD-10-CM | POA: Diagnosis not present

## 2015-06-06 DIAGNOSIS — I482 Chronic atrial fibrillation: Secondary | ICD-10-CM | POA: Diagnosis not present

## 2015-06-06 DIAGNOSIS — I10 Essential (primary) hypertension: Secondary | ICD-10-CM | POA: Diagnosis not present

## 2015-06-06 DIAGNOSIS — F329 Major depressive disorder, single episode, unspecified: Secondary | ICD-10-CM | POA: Diagnosis not present

## 2015-06-08 DIAGNOSIS — I10 Essential (primary) hypertension: Secondary | ICD-10-CM | POA: Diagnosis not present

## 2015-06-08 DIAGNOSIS — I482 Chronic atrial fibrillation: Secondary | ICD-10-CM | POA: Diagnosis not present

## 2015-06-08 DIAGNOSIS — F329 Major depressive disorder, single episode, unspecified: Secondary | ICD-10-CM | POA: Diagnosis not present

## 2015-06-08 DIAGNOSIS — M6281 Muscle weakness (generalized): Secondary | ICD-10-CM | POA: Diagnosis not present

## 2015-06-08 DIAGNOSIS — Z87440 Personal history of urinary (tract) infections: Secondary | ICD-10-CM | POA: Diagnosis not present

## 2015-06-13 DIAGNOSIS — Z87440 Personal history of urinary (tract) infections: Secondary | ICD-10-CM | POA: Diagnosis not present

## 2015-06-13 DIAGNOSIS — M6281 Muscle weakness (generalized): Secondary | ICD-10-CM | POA: Diagnosis not present

## 2015-06-13 DIAGNOSIS — I10 Essential (primary) hypertension: Secondary | ICD-10-CM | POA: Diagnosis not present

## 2015-06-13 DIAGNOSIS — I482 Chronic atrial fibrillation: Secondary | ICD-10-CM | POA: Diagnosis not present

## 2015-06-13 DIAGNOSIS — F329 Major depressive disorder, single episode, unspecified: Secondary | ICD-10-CM | POA: Diagnosis not present

## 2015-06-14 DIAGNOSIS — R319 Hematuria, unspecified: Secondary | ICD-10-CM | POA: Diagnosis not present

## 2015-06-14 DIAGNOSIS — K625 Hemorrhage of anus and rectum: Secondary | ICD-10-CM | POA: Diagnosis not present

## 2015-06-14 DIAGNOSIS — K648 Other hemorrhoids: Secondary | ICD-10-CM | POA: Diagnosis not present

## 2015-06-15 DIAGNOSIS — F329 Major depressive disorder, single episode, unspecified: Secondary | ICD-10-CM | POA: Diagnosis not present

## 2015-06-15 DIAGNOSIS — M6281 Muscle weakness (generalized): Secondary | ICD-10-CM | POA: Diagnosis not present

## 2015-06-15 DIAGNOSIS — I482 Chronic atrial fibrillation: Secondary | ICD-10-CM | POA: Diagnosis not present

## 2015-06-15 DIAGNOSIS — I10 Essential (primary) hypertension: Secondary | ICD-10-CM | POA: Diagnosis not present

## 2015-06-15 DIAGNOSIS — Z87440 Personal history of urinary (tract) infections: Secondary | ICD-10-CM | POA: Diagnosis not present

## 2015-06-20 DIAGNOSIS — F329 Major depressive disorder, single episode, unspecified: Secondary | ICD-10-CM | POA: Diagnosis not present

## 2015-06-20 DIAGNOSIS — Z87440 Personal history of urinary (tract) infections: Secondary | ICD-10-CM | POA: Diagnosis not present

## 2015-06-20 DIAGNOSIS — I10 Essential (primary) hypertension: Secondary | ICD-10-CM | POA: Diagnosis not present

## 2015-06-20 DIAGNOSIS — I482 Chronic atrial fibrillation: Secondary | ICD-10-CM | POA: Diagnosis not present

## 2015-06-20 DIAGNOSIS — M6281 Muscle weakness (generalized): Secondary | ICD-10-CM | POA: Diagnosis not present

## 2015-06-22 DIAGNOSIS — Z87440 Personal history of urinary (tract) infections: Secondary | ICD-10-CM | POA: Diagnosis not present

## 2015-06-22 DIAGNOSIS — I10 Essential (primary) hypertension: Secondary | ICD-10-CM | POA: Diagnosis not present

## 2015-06-22 DIAGNOSIS — F329 Major depressive disorder, single episode, unspecified: Secondary | ICD-10-CM | POA: Diagnosis not present

## 2015-06-22 DIAGNOSIS — I482 Chronic atrial fibrillation: Secondary | ICD-10-CM | POA: Diagnosis not present

## 2015-06-22 DIAGNOSIS — M6281 Muscle weakness (generalized): Secondary | ICD-10-CM | POA: Diagnosis not present

## 2015-07-31 DIAGNOSIS — R609 Edema, unspecified: Secondary | ICD-10-CM | POA: Diagnosis not present

## 2015-07-31 DIAGNOSIS — N183 Chronic kidney disease, stage 3 (moderate): Secondary | ICD-10-CM | POA: Diagnosis not present

## 2015-07-31 DIAGNOSIS — R350 Frequency of micturition: Secondary | ICD-10-CM | POA: Diagnosis not present

## 2015-07-31 DIAGNOSIS — K219 Gastro-esophageal reflux disease without esophagitis: Secondary | ICD-10-CM | POA: Diagnosis not present

## 2015-07-31 DIAGNOSIS — N3000 Acute cystitis without hematuria: Secondary | ICD-10-CM | POA: Diagnosis not present

## 2015-07-31 DIAGNOSIS — I129 Hypertensive chronic kidney disease with stage 1 through stage 4 chronic kidney disease, or unspecified chronic kidney disease: Secondary | ICD-10-CM | POA: Diagnosis not present

## 2015-07-31 DIAGNOSIS — E782 Mixed hyperlipidemia: Secondary | ICD-10-CM | POA: Diagnosis not present

## 2015-07-31 DIAGNOSIS — F325 Major depressive disorder, single episode, in full remission: Secondary | ICD-10-CM | POA: Diagnosis not present

## 2015-08-10 DIAGNOSIS — I129 Hypertensive chronic kidney disease with stage 1 through stage 4 chronic kidney disease, or unspecified chronic kidney disease: Secondary | ICD-10-CM | POA: Diagnosis not present

## 2015-08-10 DIAGNOSIS — E785 Hyperlipidemia, unspecified: Secondary | ICD-10-CM | POA: Diagnosis not present

## 2015-10-10 DIAGNOSIS — Z961 Presence of intraocular lens: Secondary | ICD-10-CM | POA: Diagnosis not present

## 2015-10-10 DIAGNOSIS — H18413 Arcus senilis, bilateral: Secondary | ICD-10-CM | POA: Diagnosis not present

## 2015-10-10 DIAGNOSIS — H04123 Dry eye syndrome of bilateral lacrimal glands: Secondary | ICD-10-CM | POA: Diagnosis not present

## 2015-11-13 DIAGNOSIS — W101XXA Fall (on)(from) sidewalk curb, initial encounter: Secondary | ICD-10-CM | POA: Diagnosis not present

## 2015-11-13 DIAGNOSIS — S20212A Contusion of left front wall of thorax, initial encounter: Secondary | ICD-10-CM | POA: Diagnosis not present

## 2015-11-13 DIAGNOSIS — W19XXXA Unspecified fall, initial encounter: Secondary | ICD-10-CM | POA: Diagnosis not present

## 2015-11-20 DIAGNOSIS — S20212D Contusion of left front wall of thorax, subsequent encounter: Secondary | ICD-10-CM | POA: Diagnosis not present

## 2015-11-20 DIAGNOSIS — S300XXD Contusion of lower back and pelvis, subsequent encounter: Secondary | ICD-10-CM | POA: Diagnosis not present

## 2015-12-01 ENCOUNTER — Other Ambulatory Visit: Payer: Self-pay | Admitting: Family Medicine

## 2015-12-01 ENCOUNTER — Ambulatory Visit
Admission: RE | Admit: 2015-12-01 | Discharge: 2015-12-01 | Disposition: A | Discharge status: Home / Self Care | Payer: Medicare Other | Source: Ambulatory Visit | Attending: Family Medicine | Admitting: Family Medicine

## 2015-12-01 DIAGNOSIS — R0781 Pleurodynia: Secondary | ICD-10-CM | POA: Diagnosis not present

## 2016-01-29 DIAGNOSIS — I129 Hypertensive chronic kidney disease with stage 1 through stage 4 chronic kidney disease, or unspecified chronic kidney disease: Secondary | ICD-10-CM | POA: Diagnosis not present

## 2016-01-29 DIAGNOSIS — N183 Chronic kidney disease, stage 3 (moderate): Secondary | ICD-10-CM | POA: Diagnosis not present

## 2016-01-29 DIAGNOSIS — K219 Gastro-esophageal reflux disease without esophagitis: Secondary | ICD-10-CM | POA: Diagnosis not present

## 2016-01-29 DIAGNOSIS — F325 Major depressive disorder, single episode, in full remission: Secondary | ICD-10-CM | POA: Diagnosis not present

## 2016-01-29 DIAGNOSIS — R609 Edema, unspecified: Secondary | ICD-10-CM | POA: Diagnosis not present

## 2016-01-29 DIAGNOSIS — E782 Mixed hyperlipidemia: Secondary | ICD-10-CM | POA: Diagnosis not present

## 2016-01-29 DIAGNOSIS — E039 Hypothyroidism, unspecified: Secondary | ICD-10-CM | POA: Diagnosis not present

## 2016-04-12 DIAGNOSIS — Z803 Family history of malignant neoplasm of breast: Secondary | ICD-10-CM | POA: Diagnosis not present

## 2016-04-12 DIAGNOSIS — Z1231 Encounter for screening mammogram for malignant neoplasm of breast: Secondary | ICD-10-CM | POA: Diagnosis not present

## 2016-04-30 DIAGNOSIS — L03115 Cellulitis of right lower limb: Secondary | ICD-10-CM | POA: Diagnosis not present

## 2016-07-24 ENCOUNTER — Ambulatory Visit
Admission: RE | Admit: 2016-07-24 | Discharge: 2016-07-24 | Disposition: A | Discharge status: Home / Self Care | Payer: Medicare Other | Source: Ambulatory Visit | Attending: Family Medicine | Admitting: Family Medicine

## 2016-07-24 ENCOUNTER — Other Ambulatory Visit: Payer: Self-pay | Admitting: Family Medicine

## 2016-07-24 DIAGNOSIS — R609 Edema, unspecified: Secondary | ICD-10-CM | POA: Diagnosis not present

## 2016-07-24 DIAGNOSIS — M25571 Pain in right ankle and joints of right foot: Secondary | ICD-10-CM

## 2016-07-24 DIAGNOSIS — S8264XA Nondisplaced fracture of lateral malleolus of right fibula, initial encounter for closed fracture: Secondary | ICD-10-CM | POA: Diagnosis not present

## 2016-07-24 DIAGNOSIS — F325 Major depressive disorder, single episode, in full remission: Secondary | ICD-10-CM | POA: Diagnosis not present

## 2016-07-24 DIAGNOSIS — Z23 Encounter for immunization: Secondary | ICD-10-CM | POA: Diagnosis not present

## 2016-07-24 DIAGNOSIS — N183 Chronic kidney disease, stage 3 (moderate): Secondary | ICD-10-CM | POA: Diagnosis not present

## 2016-07-24 DIAGNOSIS — E782 Mixed hyperlipidemia: Secondary | ICD-10-CM | POA: Diagnosis not present

## 2016-07-24 DIAGNOSIS — I129 Hypertensive chronic kidney disease with stage 1 through stage 4 chronic kidney disease, or unspecified chronic kidney disease: Secondary | ICD-10-CM | POA: Diagnosis not present

## 2016-07-24 DIAGNOSIS — Z0000 Encounter for general adult medical examination without abnormal findings: Secondary | ICD-10-CM | POA: Diagnosis not present

## 2016-07-24 DIAGNOSIS — D485 Neoplasm of uncertain behavior of skin: Secondary | ICD-10-CM | POA: Diagnosis not present

## 2016-07-24 DIAGNOSIS — K219 Gastro-esophageal reflux disease without esophagitis: Secondary | ICD-10-CM | POA: Diagnosis not present

## 2016-07-24 DIAGNOSIS — N390 Urinary tract infection, site not specified: Secondary | ICD-10-CM | POA: Diagnosis not present

## 2016-07-24 DIAGNOSIS — E039 Hypothyroidism, unspecified: Secondary | ICD-10-CM | POA: Diagnosis not present

## 2016-07-24 DIAGNOSIS — S89391A Other physeal fracture of lower end of right fibula, initial encounter for closed fracture: Secondary | ICD-10-CM | POA: Diagnosis not present

## 2016-07-25 DIAGNOSIS — M25571 Pain in right ankle and joints of right foot: Secondary | ICD-10-CM | POA: Diagnosis not present

## 2016-07-25 DIAGNOSIS — M84471D Pathological fracture, right ankle, subsequent encounter for fracture with routine healing: Secondary | ICD-10-CM | POA: Diagnosis not present

## 2016-07-31 DIAGNOSIS — Z993 Dependence on wheelchair: Secondary | ICD-10-CM | POA: Diagnosis not present

## 2016-07-31 DIAGNOSIS — Z87440 Personal history of urinary (tract) infections: Secondary | ICD-10-CM | POA: Diagnosis not present

## 2016-07-31 DIAGNOSIS — M25571 Pain in right ankle and joints of right foot: Secondary | ICD-10-CM | POA: Diagnosis not present

## 2016-07-31 DIAGNOSIS — I10 Essential (primary) hypertension: Secondary | ICD-10-CM | POA: Diagnosis not present

## 2016-07-31 DIAGNOSIS — R278 Other lack of coordination: Secondary | ICD-10-CM | POA: Diagnosis not present

## 2016-07-31 DIAGNOSIS — S8264XD Nondisplaced fracture of lateral malleolus of right fibula, subsequent encounter for closed fracture with routine healing: Secondary | ICD-10-CM | POA: Diagnosis not present

## 2016-07-31 DIAGNOSIS — Z9181 History of falling: Secondary | ICD-10-CM | POA: Diagnosis not present

## 2016-08-02 DIAGNOSIS — M25571 Pain in right ankle and joints of right foot: Secondary | ICD-10-CM | POA: Diagnosis not present

## 2016-08-07 DIAGNOSIS — M25571 Pain in right ankle and joints of right foot: Secondary | ICD-10-CM | POA: Diagnosis not present

## 2016-08-07 DIAGNOSIS — Z9181 History of falling: Secondary | ICD-10-CM | POA: Diagnosis not present

## 2016-08-07 DIAGNOSIS — R278 Other lack of coordination: Secondary | ICD-10-CM | POA: Diagnosis not present

## 2016-08-07 DIAGNOSIS — Z993 Dependence on wheelchair: Secondary | ICD-10-CM | POA: Diagnosis not present

## 2016-08-07 DIAGNOSIS — I10 Essential (primary) hypertension: Secondary | ICD-10-CM | POA: Diagnosis not present

## 2016-08-07 DIAGNOSIS — S8264XD Nondisplaced fracture of lateral malleolus of right fibula, subsequent encounter for closed fracture with routine healing: Secondary | ICD-10-CM | POA: Diagnosis not present

## 2016-08-09 DIAGNOSIS — Z9181 History of falling: Secondary | ICD-10-CM | POA: Diagnosis not present

## 2016-08-09 DIAGNOSIS — S8264XD Nondisplaced fracture of lateral malleolus of right fibula, subsequent encounter for closed fracture with routine healing: Secondary | ICD-10-CM | POA: Diagnosis not present

## 2016-08-09 DIAGNOSIS — Z993 Dependence on wheelchair: Secondary | ICD-10-CM | POA: Diagnosis not present

## 2016-08-09 DIAGNOSIS — I10 Essential (primary) hypertension: Secondary | ICD-10-CM | POA: Diagnosis not present

## 2016-08-09 DIAGNOSIS — R278 Other lack of coordination: Secondary | ICD-10-CM | POA: Diagnosis not present

## 2016-08-09 DIAGNOSIS — M25571 Pain in right ankle and joints of right foot: Secondary | ICD-10-CM | POA: Diagnosis not present

## 2016-08-14 DIAGNOSIS — N183 Chronic kidney disease, stage 3 (moderate): Secondary | ICD-10-CM | POA: Diagnosis not present

## 2016-08-14 DIAGNOSIS — E782 Mixed hyperlipidemia: Secondary | ICD-10-CM | POA: Diagnosis not present

## 2016-09-02 DIAGNOSIS — M25571 Pain in right ankle and joints of right foot: Secondary | ICD-10-CM | POA: Diagnosis not present

## 2016-09-10 DIAGNOSIS — M84471D Pathological fracture, right ankle, subsequent encounter for fracture with routine healing: Secondary | ICD-10-CM | POA: Diagnosis not present

## 2016-09-10 DIAGNOSIS — M25571 Pain in right ankle and joints of right foot: Secondary | ICD-10-CM | POA: Diagnosis not present

## 2016-09-11 DIAGNOSIS — M84471D Pathological fracture, right ankle, subsequent encounter for fracture with routine healing: Secondary | ICD-10-CM | POA: Diagnosis not present

## 2016-09-11 DIAGNOSIS — M25571 Pain in right ankle and joints of right foot: Secondary | ICD-10-CM | POA: Diagnosis not present

## 2016-09-16 DIAGNOSIS — N183 Chronic kidney disease, stage 3 (moderate): Secondary | ICD-10-CM | POA: Diagnosis not present

## 2016-09-17 DIAGNOSIS — M84471D Pathological fracture, right ankle, subsequent encounter for fracture with routine healing: Secondary | ICD-10-CM | POA: Diagnosis not present

## 2016-09-17 DIAGNOSIS — M25571 Pain in right ankle and joints of right foot: Secondary | ICD-10-CM | POA: Diagnosis not present

## 2016-09-19 DIAGNOSIS — M25571 Pain in right ankle and joints of right foot: Secondary | ICD-10-CM | POA: Diagnosis not present

## 2016-09-19 DIAGNOSIS — M84471D Pathological fracture, right ankle, subsequent encounter for fracture with routine healing: Secondary | ICD-10-CM | POA: Diagnosis not present

## 2016-09-23 DIAGNOSIS — M25571 Pain in right ankle and joints of right foot: Secondary | ICD-10-CM | POA: Diagnosis not present

## 2016-09-23 DIAGNOSIS — M84471D Pathological fracture, right ankle, subsequent encounter for fracture with routine healing: Secondary | ICD-10-CM | POA: Diagnosis not present

## 2016-09-25 DIAGNOSIS — L821 Other seborrheic keratosis: Secondary | ICD-10-CM | POA: Diagnosis not present

## 2016-09-26 DIAGNOSIS — M84471D Pathological fracture, right ankle, subsequent encounter for fracture with routine healing: Secondary | ICD-10-CM | POA: Diagnosis not present

## 2016-09-26 DIAGNOSIS — M25571 Pain in right ankle and joints of right foot: Secondary | ICD-10-CM | POA: Diagnosis not present

## 2016-10-02 DIAGNOSIS — M25571 Pain in right ankle and joints of right foot: Secondary | ICD-10-CM | POA: Diagnosis not present

## 2016-10-02 DIAGNOSIS — M84471D Pathological fracture, right ankle, subsequent encounter for fracture with routine healing: Secondary | ICD-10-CM | POA: Diagnosis not present

## 2016-10-08 DIAGNOSIS — M25571 Pain in right ankle and joints of right foot: Secondary | ICD-10-CM | POA: Diagnosis not present

## 2016-10-08 DIAGNOSIS — M84471D Pathological fracture, right ankle, subsequent encounter for fracture with routine healing: Secondary | ICD-10-CM | POA: Diagnosis not present

## 2016-10-14 DIAGNOSIS — M25571 Pain in right ankle and joints of right foot: Secondary | ICD-10-CM | POA: Diagnosis not present

## 2016-11-19 DIAGNOSIS — H18413 Arcus senilis, bilateral: Secondary | ICD-10-CM | POA: Diagnosis not present

## 2016-11-19 DIAGNOSIS — H02839 Dermatochalasis of unspecified eye, unspecified eyelid: Secondary | ICD-10-CM | POA: Diagnosis not present

## 2016-11-19 DIAGNOSIS — H04123 Dry eye syndrome of bilateral lacrimal glands: Secondary | ICD-10-CM | POA: Diagnosis not present

## 2016-11-19 DIAGNOSIS — Z961 Presence of intraocular lens: Secondary | ICD-10-CM | POA: Diagnosis not present

## 2017-01-22 DIAGNOSIS — E039 Hypothyroidism, unspecified: Secondary | ICD-10-CM | POA: Diagnosis not present

## 2017-01-22 DIAGNOSIS — F325 Major depressive disorder, single episode, in full remission: Secondary | ICD-10-CM | POA: Diagnosis not present

## 2017-01-22 DIAGNOSIS — K219 Gastro-esophageal reflux disease without esophagitis: Secondary | ICD-10-CM | POA: Diagnosis not present

## 2017-01-22 DIAGNOSIS — I129 Hypertensive chronic kidney disease with stage 1 through stage 4 chronic kidney disease, or unspecified chronic kidney disease: Secondary | ICD-10-CM | POA: Diagnosis not present

## 2017-01-22 DIAGNOSIS — R609 Edema, unspecified: Secondary | ICD-10-CM | POA: Diagnosis not present

## 2017-01-22 DIAGNOSIS — N3941 Urge incontinence: Secondary | ICD-10-CM | POA: Diagnosis not present

## 2017-01-22 DIAGNOSIS — N183 Chronic kidney disease, stage 3 (moderate): Secondary | ICD-10-CM | POA: Diagnosis not present

## 2017-01-22 DIAGNOSIS — E782 Mixed hyperlipidemia: Secondary | ICD-10-CM | POA: Diagnosis not present

## 2017-02-11 DIAGNOSIS — E039 Hypothyroidism, unspecified: Secondary | ICD-10-CM | POA: Diagnosis not present

## 2017-07-13 DIAGNOSIS — S93401A Sprain of unspecified ligament of right ankle, initial encounter: Secondary | ICD-10-CM | POA: Diagnosis not present

## 2017-07-13 DIAGNOSIS — S89301A Unspecified physeal fracture of lower end of right fibula, initial encounter for closed fracture: Secondary | ICD-10-CM | POA: Diagnosis not present

## 2017-07-13 DIAGNOSIS — W19XXXA Unspecified fall, initial encounter: Secondary | ICD-10-CM | POA: Diagnosis not present

## 2017-07-14 DIAGNOSIS — M25571 Pain in right ankle and joints of right foot: Secondary | ICD-10-CM | POA: Diagnosis not present

## 2017-07-28 DIAGNOSIS — Z23 Encounter for immunization: Secondary | ICD-10-CM | POA: Diagnosis not present

## 2017-07-28 DIAGNOSIS — E782 Mixed hyperlipidemia: Secondary | ICD-10-CM | POA: Diagnosis not present

## 2017-07-28 DIAGNOSIS — N390 Urinary tract infection, site not specified: Secondary | ICD-10-CM | POA: Diagnosis not present

## 2017-07-28 DIAGNOSIS — F325 Major depressive disorder, single episode, in full remission: Secondary | ICD-10-CM | POA: Diagnosis not present

## 2017-07-28 DIAGNOSIS — N183 Chronic kidney disease, stage 3 (moderate): Secondary | ICD-10-CM | POA: Diagnosis not present

## 2017-07-28 DIAGNOSIS — R609 Edema, unspecified: Secondary | ICD-10-CM | POA: Diagnosis not present

## 2017-07-28 DIAGNOSIS — Z6829 Body mass index (BMI) 29.0-29.9, adult: Secondary | ICD-10-CM | POA: Diagnosis not present

## 2017-07-28 DIAGNOSIS — Z1389 Encounter for screening for other disorder: Secondary | ICD-10-CM | POA: Diagnosis not present

## 2017-07-28 DIAGNOSIS — K219 Gastro-esophageal reflux disease without esophagitis: Secondary | ICD-10-CM | POA: Diagnosis not present

## 2017-07-28 DIAGNOSIS — I129 Hypertensive chronic kidney disease with stage 1 through stage 4 chronic kidney disease, or unspecified chronic kidney disease: Secondary | ICD-10-CM | POA: Diagnosis not present

## 2017-07-28 DIAGNOSIS — Z0000 Encounter for general adult medical examination without abnormal findings: Secondary | ICD-10-CM | POA: Diagnosis not present

## 2017-07-28 DIAGNOSIS — E039 Hypothyroidism, unspecified: Secondary | ICD-10-CM | POA: Diagnosis not present

## 2017-08-08 DIAGNOSIS — M25571 Pain in right ankle and joints of right foot: Secondary | ICD-10-CM | POA: Diagnosis not present

## 2017-09-05 DIAGNOSIS — M25571 Pain in right ankle and joints of right foot: Secondary | ICD-10-CM | POA: Diagnosis not present

## 2017-10-03 DIAGNOSIS — E782 Mixed hyperlipidemia: Secondary | ICD-10-CM | POA: Diagnosis not present

## 2017-12-01 ENCOUNTER — Other Ambulatory Visit: Payer: Self-pay | Admitting: Family Medicine

## 2017-12-01 ENCOUNTER — Ambulatory Visit
Admission: RE | Admit: 2017-12-01 | Discharge: 2017-12-01 | Disposition: A | Discharge status: Home / Self Care | Payer: Medicare Other | Source: Ambulatory Visit | Attending: Family Medicine | Admitting: Family Medicine

## 2017-12-01 DIAGNOSIS — R52 Pain, unspecified: Secondary | ICD-10-CM

## 2017-12-01 DIAGNOSIS — Z6829 Body mass index (BMI) 29.0-29.9, adult: Secondary | ICD-10-CM | POA: Diagnosis not present

## 2017-12-01 DIAGNOSIS — M533 Sacrococcygeal disorders, not elsewhere classified: Secondary | ICD-10-CM | POA: Diagnosis not present

## 2017-12-01 DIAGNOSIS — S3992XA Unspecified injury of lower back, initial encounter: Secondary | ICD-10-CM | POA: Diagnosis not present

## 2017-12-16 DIAGNOSIS — H04123 Dry eye syndrome of bilateral lacrimal glands: Secondary | ICD-10-CM | POA: Diagnosis not present

## 2017-12-16 DIAGNOSIS — H18413 Arcus senilis, bilateral: Secondary | ICD-10-CM | POA: Diagnosis not present

## 2017-12-16 DIAGNOSIS — H02839 Dermatochalasis of unspecified eye, unspecified eyelid: Secondary | ICD-10-CM | POA: Diagnosis not present

## 2017-12-16 DIAGNOSIS — Z961 Presence of intraocular lens: Secondary | ICD-10-CM | POA: Diagnosis not present

## 2018-01-28 DIAGNOSIS — E039 Hypothyroidism, unspecified: Secondary | ICD-10-CM | POA: Diagnosis not present

## 2018-01-28 DIAGNOSIS — M25512 Pain in left shoulder: Secondary | ICD-10-CM | POA: Diagnosis not present

## 2018-01-28 DIAGNOSIS — R609 Edema, unspecified: Secondary | ICD-10-CM | POA: Diagnosis not present

## 2018-01-28 DIAGNOSIS — F325 Major depressive disorder, single episode, in full remission: Secondary | ICD-10-CM | POA: Diagnosis not present

## 2018-01-28 DIAGNOSIS — N3941 Urge incontinence: Secondary | ICD-10-CM | POA: Diagnosis not present

## 2018-01-28 DIAGNOSIS — I129 Hypertensive chronic kidney disease with stage 1 through stage 4 chronic kidney disease, or unspecified chronic kidney disease: Secondary | ICD-10-CM | POA: Diagnosis not present

## 2018-01-28 DIAGNOSIS — N183 Chronic kidney disease, stage 3 (moderate): Secondary | ICD-10-CM | POA: Diagnosis not present

## 2018-01-28 DIAGNOSIS — K219 Gastro-esophageal reflux disease without esophagitis: Secondary | ICD-10-CM | POA: Diagnosis not present

## 2018-01-28 DIAGNOSIS — E782 Mixed hyperlipidemia: Secondary | ICD-10-CM | POA: Diagnosis not present

## 2018-01-28 DIAGNOSIS — M25562 Pain in left knee: Secondary | ICD-10-CM | POA: Diagnosis not present

## 2018-02-11 DIAGNOSIS — M67912 Unspecified disorder of synovium and tendon, left shoulder: Secondary | ICD-10-CM | POA: Diagnosis not present

## 2018-08-19 DIAGNOSIS — N183 Chronic kidney disease, stage 3 (moderate): Secondary | ICD-10-CM | POA: Diagnosis not present

## 2018-08-19 DIAGNOSIS — E782 Mixed hyperlipidemia: Secondary | ICD-10-CM | POA: Diagnosis not present

## 2018-08-19 DIAGNOSIS — Z6830 Body mass index (BMI) 30.0-30.9, adult: Secondary | ICD-10-CM | POA: Diagnosis not present

## 2018-08-19 DIAGNOSIS — Z23 Encounter for immunization: Secondary | ICD-10-CM | POA: Diagnosis not present

## 2018-08-19 DIAGNOSIS — N390 Urinary tract infection, site not specified: Secondary | ICD-10-CM | POA: Diagnosis not present

## 2018-08-19 DIAGNOSIS — E039 Hypothyroidism, unspecified: Secondary | ICD-10-CM | POA: Diagnosis not present

## 2018-08-19 DIAGNOSIS — R413 Other amnesia: Secondary | ICD-10-CM | POA: Diagnosis not present

## 2018-08-19 DIAGNOSIS — Z0000 Encounter for general adult medical examination without abnormal findings: Secondary | ICD-10-CM | POA: Diagnosis not present

## 2018-08-19 DIAGNOSIS — I129 Hypertensive chronic kidney disease with stage 1 through stage 4 chronic kidney disease, or unspecified chronic kidney disease: Secondary | ICD-10-CM | POA: Diagnosis not present

## 2018-08-19 DIAGNOSIS — F325 Major depressive disorder, single episode, in full remission: Secondary | ICD-10-CM | POA: Diagnosis not present

## 2018-08-19 DIAGNOSIS — R739 Hyperglycemia, unspecified: Secondary | ICD-10-CM | POA: Diagnosis not present

## 2018-08-19 DIAGNOSIS — R609 Edema, unspecified: Secondary | ICD-10-CM | POA: Diagnosis not present

## 2018-08-28 DIAGNOSIS — M25512 Pain in left shoulder: Secondary | ICD-10-CM | POA: Diagnosis not present

## 2018-09-02 DIAGNOSIS — H903 Sensorineural hearing loss, bilateral: Secondary | ICD-10-CM | POA: Diagnosis not present

## 2018-09-07 DIAGNOSIS — N183 Chronic kidney disease, stage 3 (moderate): Secondary | ICD-10-CM | POA: Diagnosis not present

## 2018-09-07 DIAGNOSIS — M7542 Impingement syndrome of left shoulder: Secondary | ICD-10-CM | POA: Diagnosis not present

## 2018-09-07 DIAGNOSIS — M25512 Pain in left shoulder: Secondary | ICD-10-CM | POA: Diagnosis not present

## 2018-09-18 ENCOUNTER — Emergency Department (HOSPITAL_COMMUNITY): Payer: Medicare Other

## 2018-09-18 ENCOUNTER — Encounter (HOSPITAL_COMMUNITY): Payer: Self-pay | Admitting: Emergency Medicine

## 2018-09-18 ENCOUNTER — Inpatient Hospital Stay (HOSPITAL_COMMUNITY)
Admission: EM | Admit: 2018-09-18 | Discharge: 2018-09-21 | DRG: 445 | Disposition: A | Discharge status: Home / Self Care | Payer: Medicare Other | Attending: Internal Medicine | Admitting: Internal Medicine

## 2018-09-18 ENCOUNTER — Other Ambulatory Visit: Payer: Self-pay

## 2018-09-18 DIAGNOSIS — F329 Major depressive disorder, single episode, unspecified: Secondary | ICD-10-CM | POA: Diagnosis present

## 2018-09-18 DIAGNOSIS — R110 Nausea: Secondary | ICD-10-CM | POA: Diagnosis not present

## 2018-09-18 DIAGNOSIS — Z79890 Hormone replacement therapy (postmenopausal): Secondary | ICD-10-CM | POA: Diagnosis not present

## 2018-09-18 DIAGNOSIS — K8051 Calculus of bile duct without cholangitis or cholecystitis with obstruction: Secondary | ICD-10-CM | POA: Diagnosis not present

## 2018-09-18 DIAGNOSIS — Z79899 Other long term (current) drug therapy: Secondary | ICD-10-CM

## 2018-09-18 DIAGNOSIS — E785 Hyperlipidemia, unspecified: Secondary | ICD-10-CM | POA: Diagnosis present

## 2018-09-18 DIAGNOSIS — K5730 Diverticulosis of large intestine without perforation or abscess without bleeding: Secondary | ICD-10-CM | POA: Diagnosis not present

## 2018-09-18 DIAGNOSIS — R112 Nausea with vomiting, unspecified: Secondary | ICD-10-CM | POA: Diagnosis not present

## 2018-09-18 DIAGNOSIS — K8050 Calculus of bile duct without cholangitis or cholecystitis without obstruction: Secondary | ICD-10-CM | POA: Diagnosis present

## 2018-09-18 DIAGNOSIS — R300 Dysuria: Secondary | ICD-10-CM | POA: Diagnosis not present

## 2018-09-18 DIAGNOSIS — Z880 Allergy status to penicillin: Secondary | ICD-10-CM

## 2018-09-18 DIAGNOSIS — I10 Essential (primary) hypertension: Secondary | ICD-10-CM | POA: Diagnosis present

## 2018-09-18 DIAGNOSIS — R7989 Other specified abnormal findings of blood chemistry: Secondary | ICD-10-CM | POA: Diagnosis not present

## 2018-09-18 DIAGNOSIS — R000 Tachycardia, unspecified: Secondary | ICD-10-CM | POA: Diagnosis not present

## 2018-09-18 DIAGNOSIS — I959 Hypotension, unspecified: Secondary | ICD-10-CM | POA: Diagnosis not present

## 2018-09-18 DIAGNOSIS — Z803 Family history of malignant neoplasm of breast: Secondary | ICD-10-CM | POA: Diagnosis not present

## 2018-09-18 DIAGNOSIS — R1111 Vomiting without nausea: Secondary | ICD-10-CM | POA: Diagnosis not present

## 2018-09-18 DIAGNOSIS — K838 Other specified diseases of biliary tract: Secondary | ICD-10-CM | POA: Diagnosis not present

## 2018-09-18 DIAGNOSIS — Z9049 Acquired absence of other specified parts of digestive tract: Secondary | ICD-10-CM

## 2018-09-18 DIAGNOSIS — Z87440 Personal history of urinary (tract) infections: Secondary | ICD-10-CM | POA: Diagnosis not present

## 2018-09-18 DIAGNOSIS — E039 Hypothyroidism, unspecified: Secondary | ICD-10-CM | POA: Diagnosis present

## 2018-09-18 DIAGNOSIS — N179 Acute kidney failure, unspecified: Secondary | ICD-10-CM | POA: Diagnosis not present

## 2018-09-18 DIAGNOSIS — K831 Obstruction of bile duct: Secondary | ICD-10-CM | POA: Diagnosis not present

## 2018-09-18 DIAGNOSIS — Z66 Do not resuscitate: Secondary | ICD-10-CM | POA: Diagnosis present

## 2018-09-18 DIAGNOSIS — Z808 Family history of malignant neoplasm of other organs or systems: Secondary | ICD-10-CM | POA: Diagnosis not present

## 2018-09-18 DIAGNOSIS — K219 Gastro-esophageal reflux disease without esophagitis: Secondary | ICD-10-CM | POA: Diagnosis not present

## 2018-09-18 DIAGNOSIS — R945 Abnormal results of liver function studies: Secondary | ICD-10-CM

## 2018-09-18 DIAGNOSIS — K8070 Calculus of gallbladder and bile duct without cholecystitis without obstruction: Secondary | ICD-10-CM | POA: Diagnosis not present

## 2018-09-18 DIAGNOSIS — Z833 Family history of diabetes mellitus: Secondary | ICD-10-CM | POA: Diagnosis not present

## 2018-09-18 DIAGNOSIS — N201 Calculus of ureter: Secondary | ICD-10-CM | POA: Diagnosis present

## 2018-09-18 DIAGNOSIS — D649 Anemia, unspecified: Secondary | ICD-10-CM | POA: Diagnosis present

## 2018-09-18 DIAGNOSIS — R1084 Generalized abdominal pain: Secondary | ICD-10-CM | POA: Diagnosis not present

## 2018-09-18 DIAGNOSIS — R531 Weakness: Secondary | ICD-10-CM | POA: Diagnosis not present

## 2018-09-18 LAB — CBC WITH DIFFERENTIAL/PLATELET
Abs Immature Granulocytes: 0.06 10*3/uL (ref 0.00–0.07)
Basophils Absolute: 0 10*3/uL (ref 0.0–0.1)
Basophils Relative: 0 %
Eosinophils Absolute: 0 10*3/uL (ref 0.0–0.5)
Eosinophils Relative: 0 %
HCT: 40.3 % (ref 36.0–46.0)
Hemoglobin: 13 g/dL (ref 12.0–15.0)
Immature Granulocytes: 1 %
Lymphocytes Relative: 11 %
Lymphs Abs: 1 10*3/uL (ref 0.7–4.0)
MCH: 31.1 pg (ref 26.0–34.0)
MCHC: 32.3 g/dL (ref 30.0–36.0)
MCV: 96.4 fL (ref 80.0–100.0)
Monocytes Absolute: 0.7 10*3/uL (ref 0.1–1.0)
Monocytes Relative: 8 %
Neutro Abs: 7.4 10*3/uL (ref 1.7–7.7)
Neutrophils Relative %: 80 %
Platelets: 256 10*3/uL (ref 150–400)
RBC: 4.18 MIL/uL (ref 3.87–5.11)
RDW: 12.3 % (ref 11.5–15.5)
WBC: 9.3 10*3/uL (ref 4.0–10.5)
nRBC: 0 % (ref 0.0–0.2)

## 2018-09-18 LAB — URINALYSIS, ROUTINE W REFLEX MICROSCOPIC
Bacteria, UA: NONE SEEN
Bilirubin Urine: NEGATIVE
Glucose, UA: NEGATIVE mg/dL
Hgb urine dipstick: NEGATIVE
Ketones, ur: NEGATIVE mg/dL
Nitrite: NEGATIVE
Protein, ur: NEGATIVE mg/dL
Specific Gravity, Urine: 1.013 (ref 1.005–1.030)
pH: 6 (ref 5.0–8.0)

## 2018-09-18 LAB — COMPREHENSIVE METABOLIC PANEL
ALT: 362 U/L — ABNORMAL HIGH (ref 0–44)
AST: 480 U/L — ABNORMAL HIGH (ref 15–41)
Albumin: 4.1 g/dL (ref 3.5–5.0)
Alkaline Phosphatase: 125 U/L (ref 38–126)
Anion gap: 12 (ref 5–15)
BUN: 24 mg/dL — ABNORMAL HIGH (ref 8–23)
CO2: 24 mmol/L (ref 22–32)
Calcium: 9.8 mg/dL (ref 8.9–10.3)
Chloride: 100 mmol/L (ref 98–111)
Creatinine, Ser: 1.21 mg/dL — ABNORMAL HIGH (ref 0.44–1.00)
GFR calc Af Amer: 47 mL/min — ABNORMAL LOW (ref >60)
GFR calc non Af Amer: 40 mL/min — ABNORMAL LOW (ref >60)
Glucose, Bld: 123 mg/dL — ABNORMAL HIGH (ref 70–99)
Potassium: 4 mmol/L (ref 3.5–5.1)
Sodium: 136 mmol/L (ref 135–145)
Total Bilirubin: 2.3 mg/dL — ABNORMAL HIGH (ref 0.3–1.2)
Total Protein: 7.5 g/dL (ref 6.5–8.1)

## 2018-09-18 LAB — LIPASE, BLOOD: Lipase: 32 U/L (ref 11–51)

## 2018-09-18 MED ORDER — SODIUM CHLORIDE (PF) 0.9 % IJ SOLN
INTRAMUSCULAR | Status: AC
  Filled 2018-09-18: qty 50

## 2018-09-18 MED ORDER — ACETAMINOPHEN 325 MG PO TABS
650.0000 mg | ORAL_TABLET | Freq: Four times a day (QID) | ORAL | Status: DC | PRN
  Administered 2018-09-19: 650 mg via ORAL
  Filled 2018-09-18: qty 2

## 2018-09-18 MED ORDER — METRONIDAZOLE IN NACL 5-0.79 MG/ML-% IV SOLN
500.0000 mg | Freq: Three times a day (TID) | INTRAVENOUS | Status: DC
  Administered 2018-09-19 – 2018-09-21 (×7): 500 mg via INTRAVENOUS
  Filled 2018-09-18 (×7): qty 100

## 2018-09-18 MED ORDER — ONDANSETRON HCL 4 MG/2ML IJ SOLN
4.0000 mg | Freq: Once | INTRAMUSCULAR | Status: AC
  Administered 2018-09-18: 4 mg via INTRAVENOUS
  Filled 2018-09-18: qty 2

## 2018-09-18 MED ORDER — CIPROFLOXACIN IN D5W 400 MG/200ML IV SOLN
400.0000 mg | Freq: Once | INTRAVENOUS | Status: AC
  Administered 2018-09-18: 400 mg via INTRAVENOUS
  Filled 2018-09-18: qty 200

## 2018-09-18 MED ORDER — ONDANSETRON HCL 4 MG/2ML IJ SOLN: 4.0000 mg | Freq: Four times a day (QID) | INTRAMUSCULAR | Status: DC | PRN

## 2018-09-18 MED ORDER — SODIUM CHLORIDE 0.9 % IV BOLUS
500.0000 mL | Freq: Once | INTRAVENOUS | Status: AC
  Administered 2018-09-18: 500 mL via INTRAVENOUS

## 2018-09-18 MED ORDER — CIPROFLOXACIN IN D5W 400 MG/200ML IV SOLN
400.0000 mg | Freq: Two times a day (BID) | INTRAVENOUS | Status: DC
  Administered 2018-09-19 – 2018-09-21 (×5): 400 mg via INTRAVENOUS
  Filled 2018-09-18 (×4): qty 200

## 2018-09-18 MED ORDER — SODIUM CHLORIDE 0.9 % IV SOLN
INTRAVENOUS | Status: DC
  Administered 2018-09-19: 01:00:00 via INTRAVENOUS

## 2018-09-18 MED ORDER — LEVOTHYROXINE SODIUM 100 MCG/5ML IV SOLN
50.0000 ug | Freq: Every day | INTRAVENOUS | Status: DC
  Administered 2018-09-19 – 2018-09-21 (×2): 50 ug via INTRAVENOUS
  Filled 2018-09-18 (×3): qty 5

## 2018-09-18 MED ORDER — IOPAMIDOL (ISOVUE-300) INJECTION 61%
100.0000 mL | Freq: Once | INTRAVENOUS | Status: AC | PRN
  Administered 2018-09-18: 80 mL via INTRAVENOUS

## 2018-09-18 MED ORDER — HYDRALAZINE HCL 20 MG/ML IJ SOLN: 10.0000 mg | INTRAMUSCULAR | Status: DC | PRN

## 2018-09-18 MED ORDER — METRONIDAZOLE IN NACL 5-0.79 MG/ML-% IV SOLN
500.0000 mg | Freq: Once | INTRAVENOUS | Status: AC
  Administered 2018-09-19: 500 mg via INTRAVENOUS
  Filled 2018-09-18: qty 100

## 2018-09-18 MED ORDER — ACETAMINOPHEN 650 MG RE SUPP: 650.0000 mg | Freq: Four times a day (QID) | RECTAL | Status: DC | PRN

## 2018-09-18 MED ORDER — IOPAMIDOL (ISOVUE-300) INJECTION 61%
INTRAVENOUS | Status: AC
  Filled 2018-09-18: qty 100

## 2018-09-18 MED ORDER — MORPHINE SULFATE (PF) 4 MG/ML IV SOLN
4.0000 mg | Freq: Once | INTRAVENOUS | Status: AC
  Administered 2018-09-18: 4 mg via INTRAVENOUS
  Filled 2018-09-18: qty 1

## 2018-09-18 MED ORDER — ONDANSETRON HCL 4 MG PO TABS: 4.0000 mg | ORAL_TABLET | Freq: Four times a day (QID) | ORAL | Status: DC | PRN

## 2018-09-18 NOTE — H&P (Signed)
History and Physical    Michelle Jones:[id number] DOB: 1930/04/04 DOA: 09/18/2018  PCP: Maury Dus, MD   Patient coming from: Home.  Chief Complaint: Abdominal pain.  HPI: Michelle Jones is a 85 y.o. female with history of hypertension, hyperlipidemia, hypothyroidism presents to the ER because of acute worsening of abdominal pain.  Patient has been having symptoms off and on for last 1 month but acutely worsened this morning around 2:30 AM.  Pain is diffuse with nausea and dry heaves with also diarrhea.  Patient has been taking some Imodium for the diarrhea recently.  Denies chest pain shortness of breath fever or chills.  ED Course: In the ER patient's labs show AST of 480 ALT of 362 total bilirubin 2.3 WBC count of 9.3 hemoglobin 13.  CT scan of the abdomen pelvis done shows multiple gallstones in the gallbladder remnant of the cholecystectomy area and also CBD stones and also intrahepatic biliary dilatation.  There is also a distal ureteric stone in the right ureter measuring 4 mm.  No hydronephrosis seen.  Urine does not show any definite signs of infection.  ER physician discussed with on-call gastroenterologist for [facility] who will be seeing patient in consult.  Patient started on empiric antibiotics kept n.p.o.  Review of Systems: As per HPI, rest all negative.   Past Medical History:  Diagnosis Date  . GERD (gastroesophageal reflux disease)   . Hypertension     Past Surgical History:  Procedure Laterality Date  . APPENDECTOMY    . carpel tunnel release  2002   Dr. Theodis Sato  . TONSILLECTOMY  1940s  . VAGINAL HYSTERECTOMY     ovaries left in place??? patient on premarin postop despite premenopausal age     reports that she has never smoked. She has never used smokeless tobacco. She reports that she does not drink alcohol or use drugs.  Allergies  Allergen Reactions  . Penicillins     Has patient had a PCN reaction causing immediate rash, facial/tongue/throat  swelling, SOB or lightheadedness with hypotension: no throat swelling or SOB Has patient had a PCN reaction causing severe rash involving mucus membranes or skin necrosis: unknown, patients states she had sores on her mouth Has patient had a PCN reaction that required hospitalization: no Has patient had a PCN reaction occurring within the last 10 years: no If all of the above answers are "NO", then may proceed with Cephalosp    Family History  Problem Relation Age of Onset  . Melanoma Mother   . Breast cancer Sister   . Diabetes Brother     Prior to Admission medications   Medication Sig Start Date End Date Taking? Authorizing Provider  amLODipine (NORVASC) 10 MG tablet Take 10 mg by mouth daily.   Yes [provider]  buPROPion (WELLBUTRIN XL) 300 MG 24 hr tablet Take one tablet by mouth once daily for depression 10/03/14  Yes [provider]  Coenzyme Q10 (CO Q 10) 100 MG CAPS Take 100 mg by mouth daily.   Yes [provider]  DULoxetine (CYMBALTA) 60 MG capsule Take one capsule by mouth daily for depression 10/03/14  Yes [provider]  ferrous gluconate (FERGON) 225 (27 Fe) MG tablet Take 240 mg by mouth every morning.   Yes [provider]  furosemide (LASIX) 20 MG tablet Take 20-40 mg by mouth daily as needed for fluid.    Yes [provider]  hyoscyamine (LEVSIN, ANASPAZ) 0.125 MG tablet Take 0.125-0.25  mg by mouth every 4 (four) hours as needed for cramping.   Yes [provider]  levothyroxine (SYNTHROID, LEVOTHROID) 100 MCG tablet Take one tablet by mouth every morning before breakfast for hypothyroidism 10/22/14  Yes [provider]  lisinopril-hydrochlorothiazide (PRINZIDE,ZESTORETIC) 20-25 MG tablet Take 1 tablet by mouth daily.   Yes [provider]  oxybutynin (DITROPAN) 5 MG tablet Take 5 mg by mouth every 8 (eight) hours as needed for bladder spasms.   Yes [provider]  rosuvastatin  (CRESTOR) 10 MG tablet Take 10 mg by mouth daily.   Yes [provider]  diltiazem (CARDIZEM CD) 120 MG 24 hr capsule Take 1 capsule (120 mg total) by mouth daily. Patient not taking: Reported on 09/18/2018 05/11/15   Hosie Poisson, MD    Physical Exam: Vitals:   09/18/18 2130 09/18/18 2200 09/18/18 2230 09/18/18 2300  BP: (!) 127/56 (!) 126/53 (!) 117/51 (!) 121/44  Pulse: 100 94 95 93  Resp: 15 19 20 16   Temp:      TempSrc:      SpO2: 97% 100% 98% 98%      Constitutional: Moderately built and nourished. Vitals:   09/18/18 2130 09/18/18 2200 09/18/18 2230 09/18/18 2300  BP: (!) 127/56 (!) 126/53 (!) 117/51 (!) 121/44  Pulse: 100 94 95 93  Resp: 15 19 20 16   Temp:      TempSrc:      SpO2: 97% 100% 98% 98%   Eyes: Anicteric no pallor. ENMT: No discharge from the ears eyes nose and mouth. Neck: No mass felt.  No neck rigidity. Respiratory: No rhonchi or crepitations. Cardiovascular: S1-S2 heard. Abdomen: Soft nontender bowel sounds present. Musculoskeletal: No edema.  No joint effusion. Skin: No rash. Neurologic: Alert awake oriented to time place and person.  Moves all extremities. Psychiatric: Appears normal.  Normal affect.   Labs on Admission: I have personally reviewed following labs and imaging studies  CBC: Recent Labs  Lab 09/18/18 1840  WBC 9.3  NEUTROABS 7.4  HGB 13.0  HCT 40.3  MCV 96.4  PLT 213   Basic Metabolic Panel: Recent Labs  Lab 09/18/18 1840  NA 136  K 4.0  CL 100  CO2 24  GLUCOSE 123*  BUN 24*  CREATININE 1.21*  CALCIUM 9.8   GFR: CrCl cannot be calculated (Unknown ideal weight.). Liver Function Tests: Recent Labs  Lab 09/18/18 1840  AST 480*  ALT 362*  ALKPHOS 125  BILITOT 2.3*  PROT 7.5  ALBUMIN 4.1   Recent Labs  Lab 09/18/18 1840  LIPASE 32   No results for input(s): AMMONIA in the last 168 hours. Coagulation Profile: No results for input(s): INR, PROTIME in the last 168 hours. Cardiac Enzymes: No  results for input(s): CKTOTAL, CKMB, CKMBINDEX, TROPONINI in the last 168 hours. BNP (last 3 results) No results for input(s): PROBNP in the last 8760 hours. HbA1C: No results for input(s): HGBA1C in the last 72 hours. CBG: No results for input(s): GLUCAP in the last 168 hours. Lipid Profile: No results for input(s): CHOL, HDL, LDLCALC, TRIG, CHOLHDL, LDLDIRECT in the last 72 hours. Thyroid Function Tests: No results for input(s): TSH, T4TOTAL, FREET4, T3FREE, THYROIDAB in the last 72 hours. Anemia Panel: No results for input(s): VITAMINB12, FOLATE, FERRITIN, TIBC, IRON, RETICCTPCT in the last 72 hours. Urine analysis:    Component Value Date/Time   COLORURINE YELLOW 09/18/2018 [place] 09/18/2018 1835   LABSPEC 1.013 09/18/2018 1835   PHURINE 6.0 09/18/2018  [place] 09/18/2018 [place] 09/18/2018 [place] 09/18/2018 [place] 09/18/2018 [place] 09/18/2018 1835   UROBILINOGEN 0.2 05/05/2015 1043   NITRITE NEGATIVE 09/18/2018 1835   LEUKOCYTESUR SMALL (A) 09/18/2018 1835   Sepsis Labs: @LABRCNTIP (procalcitonin:4,lacticidven:4) )No results found for this or any previous visit (from the past 240 hour(s)).   Radiological Exams on Admission: Ct Abdomen Pelvis W Contrast  Result Date: 09/18/2018 CLINICAL DATA:  85 year old female with nausea vomiting and diarrhea. Generalized abdominal pain. EXAM: CT ABDOMEN AND PELVIS WITH CONTRAST TECHNIQUE: Multidetector CT imaging of the abdomen and pelvis was performed using the standard protocol following bolus administration of intravenous contrast. CONTRAST:  59mL ISOVUE-300 IOPAMIDOL (ISOVUE-300) INJECTION 61% COMPARISON:  CT Abdomen and Pelvis 10/07/2014. FINDINGS: Lower chest: Borderline to mild cardiomegaly. No pericardial effusion. Mild dependent atelectasis in both lungs. No pleural effusion. Hepatobiliary: Chronic surgically absent  gallbladder, but mild intrahepatic biliary ductal dilatation today. At the confluence of the hepatic ducts there is dense calcification suggesting clustered stones on series 2, image 23. Distal to this the CBD is increased in conspicuity and there is a small 3-4 millimeter calculus in the distal CBD on coronal image 46. Superimposed liver enhancement remains within normal limits. Pancreas: Negative, pancreatic atrophy with no pancreatic ductal enlargement. Spleen: Negative, chronic splenic calcified granuloma. Adrenals/Urinary Tract: Negative adrenal glands. Bilateral renal enhancement and contrast excretion is symmetric and within normal limits. 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 Normal course of the left ureter. The duplicated right ureters seem to fuse proximal to the ureterovesical junction, and on series 2, image 65 there is a 4 millimeter stone in the distal right ureter. No periureteral stranding. This was not present in 2016. There are superimposed chronic pelvic phleboliths which appear stable. Unremarkable urinary bladder. Stomach/Bowel: Decompressed and negative rectum. Decompressed sigmoid colon with diverticulosis but no active inflammation. Decompressed descending colon. Mildly redundant transverse colon with mild retained stool. The cecum is on a lax mesentery. Mild retained stool in the right colon. No inflamed large bowel. Diminutive or absent appendix. Negative terminal ileum. No dilated small bowel. Small gastric hiatal hernia. Decompressed stomach decompressed and negative duodenum No free air, free fluid. Vascular/Lymphatic: Aortoiliac calcified atherosclerosis. Major arterial structures are patent. Portal venous system is patent. No lymphadenopathy. Reproductive: Surgically absent uterus. Diminutive or absent ovaries. Other: No pelvic free fluid. Musculoskeletal: No acute osseous abnormality identified. IMPRESSION: 1. Surgically absent  gallbladder but clustered biliary stones at the gallbladder remnant/confluence of the hepatic ducts, and superimposed Choledocholithiasis with a 3-4 mm calculus in the distal CBD. Associated acute intrahepatic biliary ductal dilatation. Recommend gastroenterology consultation. 2. Positive also for a 4 mm distal right ureteral calculus, which is new since 2016 but might be chronic as there are no secondary changes of obstructive uropathy. 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 Recommend Urology consultation. 3. Aortic Atherosclerosis (ICD10-I70.0). Electronically Signed   By: Genevie Ann M.D.   On: 09/18/2018 20:56    EKG: Independently reviewed.  Normal sinus rhythm.  Assessment/Plan Principal Problem:   Choledocholithiasis Active Problems:   Essential hypertension   Hypothyroidism   Right ureteral stone   Elevated LFTs    1. Abdominal pain with CBD stones and multiple gallstones at the gallbladder remnant and hepatic duct confluence with elevated LFTs.  [facility] has been consulted patient on empiric antibiotics.  Kept n.p.o. in anticipation of possible  ERCP.  IV fluids.  Pain relief medications. 2. Distal right ureteral stone measuring 4 mm with no signs of hydronephrosis.  Eventually will need urology input. 3. Hypothyroidism will dose Synthroid IV for now since patient is n.p.o. 4. Hypertension we will keep patient on PRN IV hydralazine. 5. Hyperlipidemia presently n.p.o. and also LFTs high.   DVT prophylaxis: SCDs in anticipation of procedure. Code Status: DNR confirmed with patient. Family Communication: No family at the bedside. Disposition Plan: Home. Consults called: [facility]. Admission status: Inpatient.   [name] MD [facility] Pager [phone].  If 7PM-7AM, please contact night-coverage [email or web address] Password [facility]  09/18/2018, 11:24 PM

## 2018-09-18 NOTE — Progress Notes (Signed)
Pharmacy Antibiotic Note  Michelle Jones is a 85 y.o. female admitted on 09/18/2018 with intra-abdominal infection.  Pharmacy has been consulted for cipro dosing.  Plan: Cipro 400 mg IV q12h Flagyl 500 mg IV q8h (MD) F/u scr/cultures     Temp (24hrs), Avg:97.7 F (36.5 C), Min:97.7 F (36.5 C), Max:97.7 F (36.5 C)  Recent Labs  Lab 09/18/18 1840  WBC 9.3  CREATININE 1.21*    CrCl cannot be calculated (Unknown ideal weight.).    Allergies  Allergen Reactions  . Penicillins     Has patient had a PCN reaction causing immediate rash, facial/tongue/throat swelling, SOB or lightheadedness with hypotension: no throat swelling or SOB Has patient had a PCN reaction causing severe rash involving mucus membranes or skin necrosis: unknown, patients states she had sores on her mouth Has patient had a PCN reaction that required hospitalization: no Has patient had a PCN reaction occurring within the last 10 years: no If all of the above answers are "NO", then may proceed with Cephalosp    Antimicrobials this admission: 2/7 cipro >>  2/7 flagyl >>   Dose adjustments this admission:   Microbiology results:  BCx:   UCx:    Sputum:    MRSA PCR:   Thank you for allowing pharmacy to be a part of this patient's care.  Dorrene German 09/18/2018 11:37 PM

## 2018-09-18 NOTE — ED Notes (Signed)
Pt transported to xray 

## 2018-09-18 NOTE — Progress Notes (Signed)
ED TO INPATIENT HANDOFF REPORT  Name/Age/Gender Michelle Jones 85 y.o. female  Code Status Code Status History    Date Active Date Inactive Code Status Order ID Comments User Context   05/05/2015 1735 05/11/2015 2012 DNR [id number]  Michelle Doom, MD Inpatient    Questions for Most Recent Historical Code Status (Order [id number])    Question Answer Comment   Maintain current active treatments Yes    Do not initiate new interventions Yes         Advance Directive Documentation     Most Recent Value  Type of Advance Directive  Healthcare Power of Attorney  Pre-existing out of facility DNR order (yellow form or pink MOST form)  -  "MOST" Form in Place?  -      Home/SNF/Other Home  Chief Complaint Weakness/Diarrhea  Level of Care/Admitting Diagnosis ED Disposition    ED Disposition Condition Michelle: Jones [[id number]]  Level of Care: Telemetry [5]  Admit to tele based on following criteria: Monitor for Ischemic changes  Diagnosis: Choledocholithiasis [[id number]]  Admitting Physician: [name] [phone]  Attending Physician: [name] [phone]  Estimated length of stay: past midnight tomorrow  Certification:: I certify this patient will need inpatient services for at least 2 midnights  PT Class (Do Not Modify): Inpatient [101]  PT Acc Code (Do Not Modify): Private [1]       Medical History Past Medical History:  Diagnosis Date  . GERD (gastroesophageal reflux disease)   . Hypertension     Allergies Allergies  Allergen Reactions  . Penicillins     Has patient had a PCN reaction causing immediate rash, facial/tongue/throat swelling, SOB or lightheadedness with hypotension: no throat swelling or SOB Has patient had a PCN reaction causing severe rash involving mucus membranes or skin necrosis: unknown, patients states she had sores on her mouth Has patient had a PCN reaction that required  hospitalization: no Has patient had a PCN reaction occurring within the last 10 years: no If all of the above answers are "NO", then may proceed with Cephalosp    IV Location/Drains/Wounds Patient Lines/Drains/Airways Status   Active Line/Drains/Airways    Name:   Placement date:   Placement time:   Site:   Days:   Peripheral IV 09/18/18 Left Antecubital   09/18/18    1742    Antecubital   less than 1          Labs/Imaging Results for orders placed or performed during the hospital encounter of 09/18/18 (from the past 48 hour(s))  Urinalysis, Routine w reflex microscopic     Status: Abnormal   Collection Time: 09/18/18  6:35 PM  Result Value Ref Range   Color, Urine YELLOW YELLOW   APPearance CLEAR CLEAR   Specific Gravity, Urine 1.013 1.005 - 1.030   pH 6.0 5.0 - 8.0   Glucose, UA NEGATIVE NEGATIVE mg/dL   Hgb urine dipstick NEGATIVE NEGATIVE   Bilirubin Urine NEGATIVE NEGATIVE   Ketones, ur NEGATIVE NEGATIVE mg/dL   Protein, ur NEGATIVE NEGATIVE mg/dL   Nitrite NEGATIVE NEGATIVE   Leukocytes, UA SMALL (A) NEGATIVE   RBC / HPF 0-5 0 - 5 RBC/hpf   WBC, UA 6-10 0 - 5 WBC/hpf   Bacteria, UA NONE SEEN NONE SEEN   Squamous Epithelial / LPF 0-5 0 - 5   Hyaline Casts, UA PRESENT     Comment: Performed at [facility], [address] [place], [place] [address]  Comprehensive metabolic panel     Status: Abnormal   Collection Time: 09/18/18  6:40 PM  Result Value Ref Range   Sodium 136 135 - 145 mmol/L   Potassium 4.0 3.5 - 5.1 mmol/L   Chloride 100 98 - 111 mmol/L   CO2 24 22 - 32 mmol/L   Glucose, Bld 123 (H) 70 - 99 mg/dL   BUN 24 (H) 8 - 23 mg/dL   Creatinine, Ser 1.21 (H) 0.44 - 1.00 mg/dL   Calcium 9.8 8.9 - 10.3 mg/dL   Total Protein 7.5 6.5 - 8.1 g/dL   Albumin 4.1 3.5 - 5.0 g/dL   AST 480 (H) 15 - 41 U/L   ALT 362 (H) 0 - 44 U/L   Alkaline Phosphatase 125 38 - 126 U/L   Total Bilirubin 2.3 (H) 0.3 - 1.2 mg/dL   GFR calc non Af Amer 40 (L) >60  mL/min   GFR calc Af Amer 47 (L) >60 mL/min   Anion gap 12 5 - 15    Comment: Performed at [facility], [place] [address] [place], [place] [address]  CBC with Differential     Status: None   Collection Time: 09/18/18  6:40 PM  Result Value Ref Range   WBC 9.3 4.0 - 10.5 K/uL   RBC 4.18 3.87 - 5.11 MIL/uL   Hemoglobin 13.0 12.0 - 15.0 g/dL   HCT 40.3 36.0 - 46.0 %   MCV 96.4 80.0 - 100.0 fL   MCH 31.1 26.0 - 34.0 pg   MCHC 32.3 30.0 - 36.0 g/dL   RDW 12.3 11.5 - 15.5 %   Platelets 256 150 - 400 K/uL   nRBC 0.0 0.0 - 0.2 %   Neutrophils Relative % 80 %   Neutro Abs 7.4 1.7 - 7.7 K/uL   Lymphocytes Relative 11 %   Lymphs Abs 1.0 0.7 - 4.0 K/uL   Monocytes Relative 8 %   Monocytes Absolute 0.7 0.1 - 1.0 K/uL   Eosinophils Relative 0 %   Eosinophils Absolute 0.0 0.0 - 0.5 K/uL   Basophils Relative 0 %   Basophils Absolute 0.0 0.0 - 0.1 K/uL   Immature Granulocytes 1 %   Abs Immature Granulocytes 0.06 0.00 - 0.07 K/uL    Comment: Performed at [facility], [place] [address] [place], [place] [address]  Lipase, blood     Status: None   Collection Time: 09/18/18  6:40 PM  Result Value Ref Range   Lipase 32 11 - 51 U/L    Comment: Performed at [facility], [place] [address], [place], [place] [address]   Ct Abdomen Pelvis W Contrast  Result Date: 09/18/2018 CLINICAL DATA:  85 year old female with nausea vomiting and diarrhea. Generalized abdominal pain. EXAM: CT ABDOMEN AND PELVIS WITH CONTRAST TECHNIQUE: Multidetector CT imaging of the abdomen and pelvis was performed using the standard protocol following bolus administration of intravenous contrast. CONTRAST:  26mL ISOVUE-300 IOPAMIDOL (ISOVUE-300) INJECTION 61% COMPARISON:  CT Abdomen and Pelvis 10/07/2014. FINDINGS: Lower chest: Borderline to mild cardiomegaly. No pericardial effusion. Mild dependent atelectasis in both lungs. No pleural effusion. Hepatobiliary: Chronic surgically  absent gallbladder, but mild intrahepatic biliary ductal dilatation today. At the confluence of the hepatic ducts there is dense calcification suggesting clustered stones on series 2, image 23. Distal to this the CBD is increased in conspicuity and there is a small 3-4 millimeter calculus in the distal CBD on coronal image 46. Superimposed  liver enhancement remains within normal limits. Pancreas: Negative, pancreatic atrophy with no pancreatic ductal enlargement. Spleen: Negative, chronic splenic calcified granuloma. Adrenals/Urinary Tract: Negative adrenal glands. Bilateral renal enhancement and contrast excretion is symmetric and within normal limits. 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 Normal course of the left ureter. The duplicated right ureters seem to fuse proximal to the ureterovesical junction, and on series 2, image 65 there is a 4 millimeter stone in the distal right ureter. No periureteral stranding. This was not present in 2016. There are superimposed chronic pelvic phleboliths which appear stable. Unremarkable urinary bladder. Stomach/Bowel: Decompressed and negative rectum. Decompressed sigmoid colon with diverticulosis but no active inflammation. Decompressed descending colon. Mildly redundant transverse colon with mild retained stool. The cecum is on a lax mesentery. Mild retained stool in the right colon. No inflamed large bowel. Diminutive or absent appendix. Negative terminal ileum. No dilated small bowel. Small gastric hiatal hernia. Decompressed stomach decompressed and negative duodenum No free air, free fluid. Vascular/Lymphatic: Aortoiliac calcified atherosclerosis. Major arterial structures are patent. Portal venous system is patent. No lymphadenopathy. Reproductive: Surgically absent uterus. Diminutive or absent ovaries. Other: No pelvic free fluid. Musculoskeletal: No acute osseous abnormality identified. IMPRESSION: 1. Surgically absent  gallbladder but clustered biliary stones at the gallbladder remnant/confluence of the hepatic ducts, and superimposed Choledocholithiasis with a 3-4 mm calculus in the distal CBD. Associated acute intrahepatic biliary ductal dilatation. Recommend gastroenterology consultation. 2. Positive also for a 4 mm distal right ureteral calculus, which is new since 2016 but might be chronic as there are no secondary changes of obstructive uropathy. 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 Recommend Urology consultation. 3. Aortic Atherosclerosis (ICD10-I70.0). Electronically Signed   By: Genevie Ann M.D.   On: 09/18/2018 20:56    Pending Labs Unresulted Labs (From admission, onward)   None      Vitals/Pain Today's Vitals   09/18/18 2200 09/18/18 2211 09/18/18 2230 09/18/18 2300  BP: (!) 126/53  (!) 117/51 (!) 121/44  Pulse: 94  95 93  Resp: 19  20 16   Temp:      TempSrc:      SpO2: 100%  98% 98%  PainSc:  0-No pain      Isolation Precautions No active isolations  Medications Medications  iopamidol (ISOVUE-300) 61 % injection (has no administration in time range)  sodium chloride (PF) 0.9 % injection (has no administration in time range)  ciprofloxacin (CIPRO) IVPB 400 mg (400 mg Intravenous New Bag/Given 09/18/18 2210)    And  metroNIDAZOLE (FLAGYL) IVPB 500 mg (has no administration in time range)  morphine 4 MG/ML injection 4 mg (4 mg Intravenous Given 09/18/18 1841)  ondansetron (ZOFRAN) injection 4 mg (4 mg Intravenous Given 09/18/18 1841)  sodium chloride 0.9 % bolus 500 mL (0 mLs Intravenous Stopped 09/18/18 1917)  iopamidol (ISOVUE-300) 61 % injection 100 mL (80 mLs Intravenous Contrast Given 09/18/18 2029)    Mobility walks with device

## 2018-09-18 NOTE — ED Notes (Signed)
Pt transported to CT ?

## 2018-09-18 NOTE — ED Triage Notes (Signed)
The patient is from home. The family called EMS due to the patient having nausea, vomiting, diarrhea and weakness for the past 2 weeks. She recently started a new medication for abdominal cramping. EMS gave the patient a 400cc bolus.   EMS vitals:  140/61 BP 100 HR 18 RR 99% O2 sats on room air

## 2018-09-18 NOTE — ED Notes (Signed)
Bed: WA05 Expected date:  Expected time:  Means of arrival:  Comments: EMS-weakness 

## 2018-09-18 NOTE — ED Provider Notes (Signed)
Michelle Jones DEPT Provider Note   CSN: [id number] Arrival date & time: 09/18/18  1725     History   Chief Complaint Chief Complaint  Patient presents with  . Weakness  . Diarrhea  . Emesis  . Nausea    HPI Michelle Jones is a 85 y.o. female with history of GERD, hypertension, hypothyroidism presents for evaluation of cute onset, progressively worsening abdominal pain, nausea, vomiting, diarrhea.  Family reports that her symptoms have been intermittent for the past month or so however she states her symptoms acutely worsened around 2:30 AM this morning.  She notes constant generalized aching abdominal pain.  She notes persistent nausea and dry heaves but has not had any true bouts of emesis.  No aggravating or alleviating factors noted.  She denies any chest pain, shortness of breath, melena, hematochezia.  Note some dysuria, denies any other urinary symptoms.  No fevers or chills.  Last bowel movement was earlier today and was more loose.  Has tried Imodium and a medicine sent by her PCP which she thinks was hyoscyamine without any relief of her symptoms.  The history is provided by the patient.    Past Medical History:  Diagnosis Date  . GERD (gastroesophageal reflux disease)   . Hypertension     Patient Active Problem List   Diagnosis Date Noted  . Choledocholithiasis 09/18/2018  . Right ureteral stone 09/18/2018  . Elevated LFTs 09/18/2018  . UTI (urinary tract infection) 05/09/2015  . Essential hypertension 05/09/2015  . Hypothyroidism 05/09/2015  . Depression 05/09/2015  . AKI (acute kidney injury) ([place]) 05/09/2015  . Thrombocytopenia ([place]) 05/09/2015  . Hypokalemia 05/09/2015  . Elevated troponin 05/09/2015  . Anemia 05/09/2015  . E. coli sepsis ([place]) 05/09/2015  . Sepsis ([place]) 05/05/2015    Past Surgical History:  Procedure Laterality Date  . APPENDECTOMY    . carpel tunnel release  2002   Dr. Theodis Sato  . TONSILLECTOMY   1940s  . VAGINAL HYSTERECTOMY     ovaries left in place??? patient on premarin postop despite premenopausal age     OB History   No obstetric history on file.      Home Medications    Prior to Admission medications   Medication Sig Start Date End Date Taking? Authorizing Provider  acetaminophen (TYLENOL) 325 MG tablet Take 650 mg by mouth every 6 (six) hours as needed for moderate pain.    [provider]  buPROPion (WELLBUTRIN XL) 300 MG 24 hr tablet Take one tablet by mouth once daily for depression 10/03/14   [provider]  diltiazem (CARDIZEM CD) 120 MG 24 hr capsule Take 1 capsule (120 mg total) by mouth daily. 05/11/15   Hosie Poisson, MD  DULoxetine (CYMBALTA) 60 MG capsule Take one capsule by mouth daily for depression 10/03/14   [provider]  furosemide (LASIX) 20 MG tablet Take 10 mg by mouth daily.    [provider]  levothyroxine (SYNTHROID, LEVOTHROID) 100 MCG tablet Take one tablet by mouth every morning before breakfast for hypothyroidism 10/22/14   [provider]  lisinopril-hydrochlorothiazide (PRINZIDE,ZESTORETIC) 20-25 MG tablet Take 1 tablet by mouth daily.    [provider]  metoprolol succinate (TOPROL-XL) 100 MG 24 hr tablet Take one tablet by mouth once daily for hypertension 08/21/14   [provider]  ranitidine (ZANTAC) 300 MG tablet Take one tablet by mouth once daily at bedtime for GERD 01/26/15   [provider]  Family History Family History  Problem Relation Age of Onset  . Melanoma Mother   . Breast cancer Sister   . Diabetes Brother     Social History Social History   Tobacco Use  . Smoking status: Never Smoker  . Smokeless tobacco: Never Used  Substance Use Topics  . Alcohol use: No  . Drug use: No     Allergies   Penicillins   Review of Systems Review of Systems  Constitutional: Negative for chills and fever.  Respiratory: Negative for cough and  shortness of breath.   Cardiovascular: Negative for chest pain.  Gastrointestinal: Positive for abdominal pain, diarrhea and nausea. Negative for blood in stool.  Genitourinary: Positive for dysuria. Negative for frequency, hematuria and urgency.  All other systems reviewed and are negative.    Physical Exam Updated Vital Signs BP (!) 126/53   Pulse 94   Temp 97.7 F (36.5 C) (Oral)   Resp 19   SpO2 100%   Physical Exam Vitals signs and nursing note reviewed.  Constitutional:      General: She is not in acute distress.    Appearance: She is well-developed.     Comments: Appears uncomfortable  HENT:     Head: Normocephalic and atraumatic.  Eyes:     General:        Right eye: No discharge.        Left eye: No discharge.     Conjunctiva/sclera: Conjunctivae normal.  Neck:     Vascular: No JVD.     Trachea: No tracheal deviation.  Cardiovascular:     Rate and Rhythm: Normal rate and regular rhythm.     Pulses: Normal pulses.  Pulmonary:     Effort: Pulmonary effort is normal.     Comments: Bibasilar crackles Abdominal:     General: Abdomen is flat. Bowel sounds are increased. There is no distension.     Palpations: Abdomen is soft.     Tenderness: There is generalized abdominal tenderness. There is no right CVA tenderness, left CVA tenderness, guarding or rebound. Negative signs include Murphy's sign, Rovsing's sign, McBurney's sign and psoas sign.  Skin:    Findings: No erythema.  Neurological:     Mental Status: She is alert.  Psychiatric:        Behavior: Behavior normal.      ED Treatments / Results  Labs (all labs ordered are listed, but only abnormal results are displayed) Labs Reviewed  COMPREHENSIVE METABOLIC PANEL - Abnormal; Notable for the following components:      Result Value   Glucose, Bld 123 (*)    BUN 24 (*)    Creatinine, Ser 1.21 (*)    AST 480 (*)    ALT 362 (*)    Total Bilirubin 2.3 (*)    GFR calc non Af Amer 40 (*)    GFR calc Af  Amer 47 (*)    All other components within normal limits  URINALYSIS, ROUTINE W REFLEX MICROSCOPIC - Abnormal; Notable for the following components:   Leukocytes, UA SMALL (*)    All other components within normal limits  CBC WITH DIFFERENTIAL/PLATELET  LIPASE, BLOOD    EKG None  Radiology Ct Abdomen Pelvis W Contrast  Result Date: 09/18/2018 CLINICAL DATA:  85 year old female with nausea vomiting and diarrhea. Generalized abdominal pain. EXAM: CT ABDOMEN AND PELVIS WITH CONTRAST TECHNIQUE: Multidetector CT imaging of the abdomen and pelvis was performed using the standard protocol following bolus administration of intravenous contrast. CONTRAST:  26mL ISOVUE-300 IOPAMIDOL (ISOVUE-300) INJECTION 61% COMPARISON:  CT Abdomen and Pelvis 10/07/2014. FINDINGS: Lower chest: Borderline to mild cardiomegaly. No pericardial effusion. Mild dependent atelectasis in both lungs. No pleural effusion. Hepatobiliary: Chronic surgically absent gallbladder, but mild intrahepatic biliary ductal dilatation today. At the confluence of the hepatic ducts there is dense calcification suggesting clustered stones on series 2, image 23. Distal to this the CBD is increased in conspicuity and there is a small 3-4 millimeter calculus in the distal CBD on coronal image 46. Superimposed liver enhancement remains within normal limits. Pancreas: Negative, pancreatic atrophy with no pancreatic ductal enlargement. Spleen: Negative, chronic splenic calcified granuloma. Adrenals/Urinary Tract: Negative adrenal glands. Bilateral renal enhancement and contrast excretion is symmetric and within normal limits. 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 Normal course of the left ureter. The duplicated right ureters seem to fuse proximal to the ureterovesical junction, and on series 2, image 65 there is a 4 millimeter stone in the distal right ureter. No periureteral stranding. This was not present in  2016. There are superimposed chronic pelvic phleboliths which appear stable. Unremarkable urinary bladder. Stomach/Bowel: Decompressed and negative rectum. Decompressed sigmoid colon with diverticulosis but no active inflammation. Decompressed descending colon. Mildly redundant transverse colon with mild retained stool. The cecum is on a lax mesentery. Mild retained stool in the right colon. No inflamed large bowel. Diminutive or absent appendix. Negative terminal ileum. No dilated small bowel. Small gastric hiatal hernia. Decompressed stomach decompressed and negative duodenum No free air, free fluid. Vascular/Lymphatic: Aortoiliac calcified atherosclerosis. Major arterial structures are patent. Portal venous system is patent. No lymphadenopathy. Reproductive: Surgically absent uterus. Diminutive or absent ovaries. Other: No pelvic free fluid. Musculoskeletal: No acute osseous abnormality identified. IMPRESSION: 1. Surgically absent gallbladder but clustered biliary stones at the gallbladder remnant/confluence of the hepatic ducts, and superimposed Choledocholithiasis with a 3-4 mm calculus in the distal CBD. Associated acute intrahepatic biliary ductal dilatation. Recommend gastroenterology consultation. 2. Positive also for a 4 mm distal right ureteral calculus, which is new since 2016 but might be chronic as there are no secondary changes of obstructive uropathy. 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 Recommend Urology consultation. 3. Aortic Atherosclerosis (ICD10-I70.0). Electronically Signed   By: Genevie Ann M.D.   On: 09/18/2018 20:56    Procedures Procedures (including critical care time)  Medications Ordered in ED Medications  iopamidol (ISOVUE-300) 61 % injection (has no administration in time range)  sodium chloride (PF) 0.9 % injection (has no administration in time range)  ciprofloxacin (CIPRO) IVPB 400 mg (400 mg Intravenous New Bag/Given 09/18/18 2210)    And   metroNIDAZOLE (FLAGYL) IVPB 500 mg (has no administration in time range)  morphine 4 MG/ML injection 4 mg (4 mg Intravenous Given 09/18/18 1841)  ondansetron (ZOFRAN) injection 4 mg (4 mg Intravenous Given 09/18/18 1841)  sodium chloride 0.9 % bolus 500 mL (500 mLs Intravenous New Bag/Given 09/18/18 1847)  iopamidol (ISOVUE-300) 61 % injection 100 mL (80 mLs Intravenous Contrast Given 09/18/18 2029)     Initial Impression / Assessment and Plan / ED Course  I have reviewed the triage vital signs and the nursing notes.  Pertinent labs & imaging results that were available during my care of the patient were reviewed by me and considered in my medical decision making (see chart for details).     Patient with progressively worsening nausea, vomiting, diarrhea, generalized abdominal pain presents for evaluation.  She is afebrile, hypertensive initially  with improvement on reevaluation.  Abdomen is generally tender, worst in the right upper quadrant.  Lab work reviewed by me shows elevated creatinine of 1.21, elevated BUN, elevated LFTs with AST 480 ALT 362, total bilirubin 2.3.  UA does not suggest UTI or nephrolithiasis.  CT of the abdomen and pelvis shows surgically absent gallbladder but clustered biliary stones at the gallbladder remnant/confluence of the hepatic ducts, and superimposed Choledocholithiasis with a 3-4 mm calculus in the distal CBD. Associated acute intrahepatic biliary ductal dilatation. She also has a 4 mm distal right ureteral calculus, which is new since 2016 but might be chronic as there are no secondary changes of obstructive uropathy.  Discussed with Dr. Alvino Chapel and we feel that the stone is not contributing to her current symptoms.  Patient given pain medicine and nausea medicine in the ED with significant improvement.  Resting comfortably on reevaluation.  Will initiate IV antibiotics.  [facility] gastroenterology consulted, spoke with Dr. Therisa Doyne who recommends hospitalist admission but  will see patient in consultation in the hospital.  Spoke with Dr. Hal Hope with [facility] hospitalist service who agrees to assume care of patient and bring her into the hospital for further evaluation and management.  Final Clinical Impressions(s) / ED Diagnoses   Final diagnoses:  Biliary obstruction  Calculus of bile duct without cholecystitis with obstruction  Ureterolithiasis  Elevated serum creatinine    ED Discharge Orders    None       Debroah Baller 09/18/18 2239    Davonna Belling, MD 09/18/18 2357

## 2018-09-19 DIAGNOSIS — N179 Acute kidney failure, unspecified: Secondary | ICD-10-CM

## 2018-09-19 DIAGNOSIS — K831 Obstruction of bile duct: Secondary | ICD-10-CM

## 2018-09-19 DIAGNOSIS — K8050 Calculus of bile duct without cholangitis or cholecystitis without obstruction: Secondary | ICD-10-CM

## 2018-09-19 DIAGNOSIS — E039 Hypothyroidism, unspecified: Secondary | ICD-10-CM

## 2018-09-19 LAB — BASIC METABOLIC PANEL
Anion gap: 9 (ref 5–15)
BUN: 17 mg/dL (ref 8–23)
CO2: 23 mmol/L (ref 22–32)
Calcium: 9 mg/dL (ref 8.9–10.3)
Chloride: 105 mmol/L (ref 98–111)
Creatinine, Ser: 1.08 mg/dL — ABNORMAL HIGH (ref 0.44–1.00)
GFR calc Af Amer: 53 mL/min — ABNORMAL LOW (ref >60)
GFR calc non Af Amer: 46 mL/min — ABNORMAL LOW (ref >60)
Glucose, Bld: 101 mg/dL — ABNORMAL HIGH (ref 70–99)
Potassium: 3.9 mmol/L (ref 3.5–5.1)
Sodium: 137 mmol/L (ref 135–145)

## 2018-09-19 LAB — HEPATIC FUNCTION PANEL
ALK PHOS: 123 U/L (ref 38–126)
ALT: 481 U/L — ABNORMAL HIGH (ref 0–44)
AST: 499 U/L — ABNORMAL HIGH (ref 15–41)
Albumin: 3.2 g/dL — ABNORMAL LOW (ref 3.5–5.0)
Bilirubin, Direct: 1.9 mg/dL — ABNORMAL HIGH (ref 0.0–0.2)
Indirect Bilirubin: 1.3 mg/dL — ABNORMAL HIGH (ref 0.3–0.9)
Total Bilirubin: 3.2 mg/dL — ABNORMAL HIGH (ref 0.3–1.2)
Total Protein: 6.1 g/dL — ABNORMAL LOW (ref 6.5–8.1)

## 2018-09-19 LAB — CBC
HCT: 36 % (ref 36.0–46.0)
HEMOGLOBIN: 11.3 g/dL — AB (ref 12.0–15.0)
MCH: 31.2 pg (ref 26.0–34.0)
MCHC: 31.4 g/dL (ref 30.0–36.0)
MCV: 99.4 fL (ref 80.0–100.0)
Platelets: 217 10*3/uL (ref 150–400)
RBC: 3.62 MIL/uL — ABNORMAL LOW (ref 3.87–5.11)
RDW: 12.5 % (ref 11.5–15.5)
WBC: 5.1 10*3/uL (ref 4.0–10.5)
nRBC: 0 % (ref 0.0–0.2)

## 2018-09-19 LAB — SURGICAL PCR SCREEN
MRSA, PCR: NEGATIVE
Staphylococcus aureus: NEGATIVE

## 2018-09-19 LAB — GLUCOSE, CAPILLARY
Glucose-Capillary: 102 mg/dL — ABNORMAL HIGH (ref 70–99)
Glucose-Capillary: 93 mg/dL (ref 70–99)

## 2018-09-19 MED ORDER — SODIUM CHLORIDE 0.9 % IV SOLN
INTRAVENOUS | Status: DC
  Administered 2018-09-19 – 2018-09-20 (×4): via INTRAVENOUS

## 2018-09-19 MED ORDER — MUPIROCIN 2 % EX OINT: 1.0000 "application " | TOPICAL_OINTMENT | Freq: Two times a day (BID) | CUTANEOUS | Status: DC

## 2018-09-19 NOTE — Consult Note (Signed)
I have been asked to see the patient by Dr. Vernell Leep, for evaluation and management of right distal ureteral stone.  History of present illness: 85 year old female who presented to the emergency department with generalized abdominal pain and associated nausea and vomiting.  Her pain is been intermittent for the past month but progressively gotten worse over the last 24 hours.  She was complaining of some mild dysuria with no other significant urinary tract symptoms including worsening frequency, urgency, incontinence, or gross hematuria.  In the emergency department she was found to be afebrile with stable vital signs.  Laboratory evaluation demonstrated elevated LFTs.  A CT scan was then obtained which demonstrated common bile duct stone as well as a right distal ureteral stone.  There is no hydroureteronephrosis.  Her urine analysis was largely unremarkable and her creatinine was normal.  The patient was subsequently admitted and urology consulted.  Currently the patient is lying in bed comfortably without any significant complaints.  She denies any associated nausea and vomiting.  She also is not having any voiding symptoms or flank/suprapubic pain.  She has been n.p.o.  Review of systems: A 12 point comprehensive review of systems was obtained and is negative unless otherwise stated in the history of present illness.  Patient Active Problem List   Diagnosis Date Noted  . Choledocholithiasis 09/18/2018  . Right ureteral stone 09/18/2018  . Elevated LFTs 09/18/2018  . Calculus of bile duct without cholecystitis with obstruction   . UTI (urinary tract infection) 05/09/2015  . Essential hypertension 05/09/2015  . Hypothyroidism 05/09/2015  . Depression 05/09/2015  . AKI (acute kidney injury) ([place]) 05/09/2015  . Thrombocytopenia ([place]) 05/09/2015  . Hypokalemia 05/09/2015  . Elevated troponin 05/09/2015  . Anemia 05/09/2015  . E. coli sepsis ([place]) 05/09/2015  . Sepsis ([place]) 05/05/2015     No current facility-administered medications on file prior to encounter.    Current Outpatient Medications on File Prior to Encounter  Medication Sig Dispense Refill  . amLODipine (NORVASC) 10 MG tablet Take 10 mg by mouth daily.    Marland Kitchen buPROPion (WELLBUTRIN XL) 300 MG 24 hr tablet Take one tablet by mouth once daily for depression    . Coenzyme Q10 (CO Q 10) 100 MG CAPS Take 100 mg by mouth daily.    . DULoxetine (CYMBALTA) 60 MG capsule Take one capsule by mouth daily for depression    . ferrous gluconate (FERGON) 225 (27 Fe) MG tablet Take 240 mg by mouth every morning.    . furosemide (LASIX) 20 MG tablet Take 20-40 mg by mouth daily as needed for fluid.     . hyoscyamine (LEVSIN, ANASPAZ) 0.125 MG tablet Take 0.125-0.25 mg by mouth every 4 (four) hours as needed for cramping.    Marland Kitchen levothyroxine (SYNTHROID, LEVOTHROID) 100 MCG tablet Take one tablet by mouth every morning before breakfast for hypothyroidism    . lisinopril-hydrochlorothiazide (PRINZIDE,ZESTORETIC) 20-25 MG tablet Take 1 tablet by mouth daily.    Marland Kitchen oxybutynin (DITROPAN) 5 MG tablet Take 5 mg by mouth every 8 (eight) hours as needed for bladder spasms.    . rosuvastatin (CRESTOR) 10 MG tablet Take 10 mg by mouth daily.    Marland Kitchen diltiazem (CARDIZEM CD) 120 MG 24 hr capsule Take 1 capsule (120 mg total) by mouth daily. (Patient not taking: Reported on 09/18/2018) 30 capsule 0    Past Medical History:  Diagnosis Date  . GERD (gastroesophageal reflux disease)   . Hypertension     Past Surgical History:  Procedure Laterality Date  . APPENDECTOMY    . carpel tunnel release  2002   Dr. Theodis Sato  . TONSILLECTOMY  1940s  . VAGINAL HYSTERECTOMY     ovaries left in place??? patient on premarin postop despite premenopausal age    Social History   Tobacco Use  . Smoking status: Never Smoker  . Smokeless tobacco: Never Used  Substance Use Topics  . Alcohol use: No  . Drug use: No    Family History  Problem  Relation Age of Onset  . Melanoma Mother   . Breast cancer Sister   . Diabetes Brother     PE: Vitals:   09/18/18 2300 09/18/18 2337 09/18/18 2339 09/19/18 0635  BP: (!) 121/44 (!) 127/51  (!) 121/47  Pulse: 93 97  81  Resp: 16 16  16   Temp:  98.8 F (37.1 C)  98.2 F (36.8 C)  TempSrc:      SpO2: 98% 95%  95%  Weight:   76.7 kg 70 kg  Height:   5\' 3"  (1.6 m)    Patient appears to be in no acute distress  patient is alert and oriented x3 Atraumatic normocephalic head No cervical or supraclavicular lymphadenopathy appreciated No increased work of breathing, no audible wheezes/rhonchi Regular sinus rhythm/rate Abdomen is soft, nontender, nondistended, no CVA or suprapubic tenderness Lower extremities are symmetric without appreciable edema Grossly neurologically intact No identifiable skin lesions  Recent Labs    09/18/18 1840 09/19/18 0600  WBC 9.3 5.1  HGB 13.0 11.3*  HCT 40.3 36.0   Recent Labs    09/18/18 1840 09/19/18 0600  NA 136 137  K 4.0 3.9  CL 100 105  CO2 24 23  GLUCOSE 123* 101*  BUN 24* 17  CREATININE 1.21* 1.08*  CALCIUM 9.8 9.0   No results for input(s): LABPT, INR in the last 72 hours. No results for input(s): LABURIN in the last 72 hours. Results for orders placed or performed during the hospital encounter of 05/05/15  Culture, blood (routine x 2)     Status: None   Collection Time: 05/05/15  9:46 AM  Result Value Ref Range Status   Specimen Description BLOOD RIGHT FOREARM  Final   Special Requests BOTTLES DRAWN AEROBIC AND ANAEROBIC 5CC  Final   Culture  Setup Time   Final    GRAM NEGATIVE COCCOBACILLI ANAEROBIC BOTTLE ONLY CRITICAL RESULT CALLED TO, READ BACK BY AND VERIFIED WITH: K CHEEK 05/06/15 @ 1036 M VESTAL    Culture   Final    ESCHERICHIA COLI Performed at [facility]    Report Status 05/08/2015 FINAL  Final   Organism ID, Bacteria ESCHERICHIA COLI  Final      Susceptibility   Escherichia coli - MIC*     AMPICILLIN <=2 SENSITIVE Sensitive     CEFAZOLIN <=4 SENSITIVE Sensitive     CEFEPIME <=1 SENSITIVE Sensitive     CEFTAZIDIME <=1 SENSITIVE Sensitive     CEFTRIAXONE <=1 SENSITIVE Sensitive     CIPROFLOXACIN >=4 RESISTANT Resistant     GENTAMICIN 4 SENSITIVE Sensitive     IMIPENEM 1 SENSITIVE Sensitive     TRIMETH/SULFA >=320 RESISTANT Resistant     AMPICILLIN/SULBACTAM <=2 SENSITIVE Sensitive     PIP/TAZO <=4 SENSITIVE Sensitive     * ESCHERICHIA COLI  Culture, blood (routine x 2)     Status: None   Collection Time: 05/05/15 10:15 AM  Result Value Ref Range Status   Specimen Description BLOOD RIGHT  FOREARM  Final   Special Requests BOTTLES DRAWN AEROBIC AND ANAEROBIC 5CC  Final   Culture   Final    NO GROWTH 5 DAYS Performed at [facility]    Report Status 05/10/2015 FINAL  Final  Urine culture     Status: None   Collection Time: 05/05/15 10:43 AM  Result Value Ref Range Status   Specimen Description URINE, CATHETERIZED  Final   Special Requests NONE  Final   Culture   Final    MULTIPLE SPECIES PRESENT, SUGGEST RECOLLECTION Performed at [facility]    Report Status 05/07/2015 FINAL  Final  MRSA PCR Screening     Status: None   Collection Time: 05/05/15  7:50 PM  Result Value Ref Range Status   MRSA by PCR NEGATIVE NEGATIVE Final    Comment:        The GeneXpert MRSA Assay (FDA approved for NASAL specimens only), is one component of a comprehensive MRSA colonization surveillance program. It is not intended to diagnose MRSA infection nor to guide or monitor treatment for MRSA infections.     Imaging: I have independently reviewed the patient's CT scan which demonstrates a 4 mm stone in the right distal ureter with no associated hydroureteronephrosis.  There are no additional stones within the urinary tract.  There is no stranding around the kidney.  Imp: The patient has an incidental finding on her CT scan of aRight distal ureteral stone with no  significant hydroureteronephrosis or symptoms associated with an obstructing stone.  Fortunately she has no evidence of a urinary tract infection or lower urinary tract symptoms.  Recommendations: I spoke with the patient about the findings on the CT scan, and at this point would suggest conservative management or medical expulsion therapy for her ureteral stone.  If she starts to develop signs and symptoms of infection associated with the stone then she would need a stent, but otherwise we will plan to see her in clinic within 2 weeks with a repeat KUB to ensure the passage of the stone or to make any additional plans for definitive management.  If the patient can tolerate tamsulosin, this would be of benefit for her in terms of helping her pass the stone.    Thank you for involving me in this patient's care, Please page with any further questions or concerns. Ardis Hughs

## 2018-09-19 NOTE — Progress Notes (Signed)
PROGRESS NOTE   Michelle Jones  JEH:[id number]    DOB: 07-09-1930    DOA: 09/18/2018  PCP: Maury Dus, MD   I have briefly reviewed patients previous medical records in [facility].  Brief Narrative:  85 year old female, lives alone, independent, PMH of HTN, HLD, hypothyroid, depression, anemia, remote cholecystectomy, presented to ED with 1 month history of on and off abdominal pain which acutely worsened on night of admission associated with nausea, dry heaves and diarrhea.  Admitted for symptomatic choledocholithiasis.  [facility] consulted and for possible ERCP.  Assessment & Plan:   Principal Problem:   Choledocholithiasis Active Problems:   Essential hypertension   Hypothyroidism   Right ureteral stone   Elevated LFTs   1. Symptomatic choledocholithiasis: Remote history of cholecystectomy.  CT abdomen showed surgically absent gallbladder, clustered biliary stones at the gallbladder remnant/confluence of the hepatic ducts, choledocholithiasis including 3-4 mm calculus in distal CBD, acute intrahepatic biliary ductal dilatation.  AST and ALT in the 400s, bilirubin 3.2.  Continue NPO, IV fluids, empiric IV Cipro and Flagyl.  [facility] consulted, discussed with Dr. Megan Salon, okay for clear liquids and ERCP for 2/9. 2. Right distal ureteral stone: Noted incidentally on CT abdomen.  Asymptomatic of renal colic or UTI symptoms.  I discussed with Dr. Louis Meckel, urology and his input appreciated, recommends conservative management or medical expulsion therapy for her ureteral stone, Flomax, follow-up in clinic in 2 weeks with repeat KUB to ensure passage of the stone or need for further work-up. 3. Hypothyroid: Continue Synthroid. 4. Essential hypertension: PRN IV hydralazine. 5. Hyperlipidemia: Statins on hold. 6. Acute kidney injury: Presented with creatinine of 1.21.  Likely related to GI losses and poor oral intake.  Resolved after IV fluids. 7. Normocytic anemia: Suspect dilutional.   Follow CBC in a.m.   DVT prophylaxis: SCDs Code Status: DNR Family Communication: Discussed in detail with patient's daughter at bedside, updated care and answered questions. Disposition: DC home pending clinical improvement.   Consultants:  [facility]  Procedures:  None  Antimicrobials:  IV Cipro and Flagyl   Subjective: Interviewed and examined this morning with daughter and RN at bedside.  Feels much better.  Abdominal pain has resolved.  No further nausea, vomiting or diarrhea.  Reports history of cholecystectomy remotely with Dr. Hassell Done.  ROS: As above, otherwise negative  Objective:  Vitals:   09/18/18 2300 09/18/18 2337 09/18/18 2339 09/19/18 0635  BP: (!) 121/44 (!) 127/51  (!) 121/47  Pulse: 93 97  81  Resp: 16 16  16   Temp:  98.8 F (37.1 C)  98.2 F (36.8 C)  TempSrc:      SpO2: 98% 95%  95%  Weight:   76.7 kg 70 kg  Height:   5\' 3"  (1.6 m)     Examination:  General exam: Pleasant elderly female, moderately built and nourished lying comfortably propped up in bed.  Oral mucosa moist. Respiratory system: Clear to auscultation. Respiratory effort normal. Cardiovascular system: S1 & S2 heard, RRR. No JVD, murmurs, rubs, gallops or clicks. No pedal edema.  Telemetry personally reviewed: Sinus rhythm. Gastrointestinal system: Abdomen is nondistended, soft and nontender. No organomegaly or masses felt. Normal bowel sounds heard. Central nervous system: Alert and oriented. No focal neurological deficits. Extremities: Symmetric 5 x 5 power. Skin: No rashes, lesions or ulcers Psychiatry: Judgement and insight appear normal. Mood & affect appropriate.     Data Reviewed: I have personally reviewed following labs and imaging studies  CBC:  Recent Labs  Lab 09/18/18 1840 09/19/18 0600  WBC 9.3 5.1  NEUTROABS 7.4  --   HGB 13.0 11.3*  HCT 40.3 36.0  MCV 96.4 99.4  PLT 256 680   Basic Metabolic Panel: Recent Labs  Lab 09/18/18 1840 09/19/18 0600   NA 136 137  K 4.0 3.9  CL 100 105  CO2 24 23  GLUCOSE 123* 101*  BUN 24* 17  CREATININE 1.21* 1.08*  CALCIUM 9.8 9.0   Liver Function Tests: Recent Labs  Lab 09/18/18 1840 09/19/18 0600  AST 480* 499*  ALT 362* 481*  ALKPHOS 125 123  BILITOT 2.3* 3.2*  PROT 7.5 6.1*  ALBUMIN 4.1 3.2*   CBG: Recent Labs  Lab 09/19/18 0117 09/19/18 0845  GLUCAP 102* 93    Recent Results (from the past 240 hour(s))  Surgical PCR screen     Status: None   Collection Time: 09/19/18  2:44 AM  Result Value Ref Range Status   MRSA, PCR NEGATIVE NEGATIVE Final   Staphylococcus aureus NEGATIVE NEGATIVE Final    Comment: (NOTE) The Xpert SA Assay (FDA approved for NASAL specimens in patients 74 years of age and older), is one component of a comprehensive surveillance program. It is not intended to diagnose infection nor to guide or monitor treatment. Performed at [facility], [place] [address] [place], [place] [address]          Radiology Studies: Ct Abdomen Pelvis W Contrast  Result Date: 09/18/2018 CLINICAL DATA:  85 year old female with nausea vomiting and diarrhea. Generalized abdominal pain. EXAM: CT ABDOMEN AND PELVIS WITH CONTRAST TECHNIQUE: Multidetector CT imaging of the abdomen and pelvis was performed using the standard protocol following bolus administration of intravenous contrast. CONTRAST:  54mL ISOVUE-300 IOPAMIDOL (ISOVUE-300) INJECTION 61% COMPARISON:  CT Abdomen and Pelvis 10/07/2014. FINDINGS: Lower chest: Borderline to mild cardiomegaly. No pericardial effusion. Mild dependent atelectasis in both lungs. No pleural effusion. Hepatobiliary: Chronic surgically absent gallbladder, but mild intrahepatic biliary ductal dilatation today. At the confluence of the hepatic ducts there is dense calcification suggesting clustered stones on series 2, image 23. Distal to this the CBD is increased in conspicuity and there is a small 3-4 millimeter calculus in the  distal CBD on coronal image 46. Superimposed liver enhancement remains within normal limits. Pancreas: Negative, pancreatic atrophy with no pancreatic ductal enlargement. Spleen: Negative, chronic splenic calcified granuloma. Adrenals/Urinary Tract: Negative adrenal glands. Bilateral renal enhancement and contrast excretion is symmetric and within normal limits. 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 Normal course of the left ureter. The duplicated right ureters seem to fuse proximal to the ureterovesical junction, and on series 2, image 65 there is a 4 millimeter stone in the distal right ureter. No periureteral stranding. This was not present in 2016. There are superimposed chronic pelvic phleboliths which appear stable. Unremarkable urinary bladder. Stomach/Bowel: Decompressed and negative rectum. Decompressed sigmoid colon with diverticulosis but no active inflammation. Decompressed descending colon. Mildly redundant transverse colon with mild retained stool. The cecum is on a lax mesentery. Mild retained stool in the right colon. No inflamed large bowel. Diminutive or absent appendix. Negative terminal ileum. No dilated small bowel. Small gastric hiatal hernia. Decompressed stomach decompressed and negative duodenum No free air, free fluid. Vascular/Lymphatic: Aortoiliac calcified atherosclerosis. Major arterial structures are patent. Portal venous system is patent. No lymphadenopathy. Reproductive: Surgically absent uterus. Diminutive or absent ovaries. Other: No pelvic free fluid. Musculoskeletal: No acute osseous abnormality identified. IMPRESSION:  1. Surgically absent gallbladder but clustered biliary stones at the gallbladder remnant/confluence of the hepatic ducts, and superimposed Choledocholithiasis with a 3-4 mm calculus in the distal CBD. Associated acute intrahepatic biliary ductal dilatation. Recommend gastroenterology consultation. 2. Positive also  for a 4 mm distal right ureteral calculus, which is new since 2016 but might be chronic as there are no secondary changes of obstructive uropathy. 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 Recommend Urology consultation. 3. Aortic Atherosclerosis (ICD10-I70.0). Electronically Signed   By: Genevie Ann M.D.   On: 09/18/2018 20:56        Scheduled Meds: . levothyroxine  50 mcg Intravenous Daily   Continuous Infusions: . sodium chloride 75 mL/hr at 09/19/18 0043  . ciprofloxacin 400 mg (09/19/18 1113)  . metronidazole 500 mg (09/19/18 0703)     LOS: 1 day     Vernell Leep, MD, FACP, [facility]. [facility]  To contact the attending provider between 7A-7P or the covering provider during after hours 7P-7A, please log into the web site [email or web address] and access using universal [place] password for that web site. If you do not have the password, please call the hospital operator.  09/19/2018, 12:37 PM

## 2018-09-19 NOTE — Consult Note (Signed)
[facility] Consult  Referring Provider: [name], MD Primary Care Physician:  Maury Dus, MD Primary Gastroenterologist: [facility]  Reason for Consultation:  CBD stones   HPI: Michelle Jones is a 85 y.o. female was admitted yesterday with complains of generalized abdominal pain for the past 1-2 days associated with nausea. Patient also had loose stool 2 days ago which is resolved. She denies fever,yellowish discoloration of skin or change in the color of urine or stool. Patient reports having her gallbladder removed more than 20years ago. She has experienced heartburn as well as acid reflux but denies difficulty swallowing or pain on swallowing. She denies change in appetite or unintentional weight loss.   Past Medical History:  Diagnosis Date  . GERD (gastroesophageal reflux disease)   . Hypertension     Past Surgical History:  Procedure Laterality Date  . APPENDECTOMY    . carpel tunnel release  2002   Dr. Theodis Sato  . TONSILLECTOMY  1940s  . VAGINAL HYSTERECTOMY     ovaries left in place??? patient on premarin postop despite premenopausal age    Prior to Admission medications   Medication Sig Start Date End Date Taking? Authorizing Provider  amLODipine (NORVASC) 10 MG tablet Take 10 mg by mouth daily.   Yes [provider]  buPROPion (WELLBUTRIN XL) 300 MG 24 hr tablet Take one tablet by mouth once daily for depression 10/03/14  Yes [provider]  Coenzyme Q10 (CO Q 10) 100 MG CAPS Take 100 mg by mouth daily.   Yes [provider]  DULoxetine (CYMBALTA) 60 MG capsule Take one capsule by mouth daily for depression 10/03/14  Yes [provider]  ferrous gluconate (FERGON) 225 (27 Fe) MG tablet Take 240 mg by mouth every morning.   Yes [provider]  furosemide (LASIX) 20 MG tablet Take 20-40 mg by mouth daily as needed for fluid.    Yes [provider]  hyoscyamine (LEVSIN, ANASPAZ) 0.125 MG  tablet Take 0.125-0.25 mg by mouth every 4 (four) hours as needed for cramping.   Yes [provider]  levothyroxine (SYNTHROID, LEVOTHROID) 100 MCG tablet Take one tablet by mouth every morning before breakfast for hypothyroidism 10/22/14  Yes [provider]  lisinopril-hydrochlorothiazide (PRINZIDE,ZESTORETIC) 20-25 MG tablet Take 1 tablet by mouth daily.   Yes [provider]  oxybutynin (DITROPAN) 5 MG tablet Take 5 mg by mouth every 8 (eight) hours as needed for bladder spasms.   Yes [provider]  rosuvastatin (CRESTOR) 10 MG tablet Take 10 mg by mouth daily.   Yes [provider]  diltiazem (CARDIZEM CD) 120 MG 24 hr capsule Take 1 capsule (120 mg total) by mouth daily. Patient not taking: Reported on 09/18/2018 05/11/15   Hosie Poisson, MD    Current Facility-Administered Medications  Medication Dose Route Frequency Provider Last Rate Last Dose  . 0.9 %  sodium chloride infusion   Intravenous Continuous Hongalgi, Anand D, MD      . acetaminophen (TYLENOL) tablet 650 mg  650 mg Oral Q6H PRN [name], MD   650 mg at 09/19/18 1001   Or  . acetaminophen (TYLENOL) suppository 650 mg  650 mg Rectal Q6H PRN [name], MD      . ciprofloxacin (CIPRO) IVPB 400 mg  400 mg Intravenous Q12H Dorrene German, RPH 200 mL/hr at 09/19/18 1113 400 mg at 09/19/18 1113  . hydrALAZINE (APRESOLINE) injection 10 mg  10 mg Intravenous Q4H PRN  [name], MD      . levothyroxine (SYNTHROID, LEVOTHROID) injection 50 mcg  50 mcg Intravenous Daily [name], MD   50 mcg at 09/19/18 1108  . metroNIDAZOLE (FLAGYL) IVPB 500 mg  500 mg Intravenous Q8H [name], MD 100 mL/hr at 09/19/18 0703 500 mg at 09/19/18 0703  . ondansetron (ZOFRAN) tablet 4 mg  4 mg Oral Q6H PRN [name], MD       Or  . ondansetron [facility]) injection 4 mg  4 mg Intravenous Q6H PRN [name], MD        Allergies as of  09/18/2018 - Review Complete 09/18/2018  Allergen Reaction Noted  . Penicillins  10/07/2014    Family History  Problem Relation Age of Onset  . Melanoma Mother   . Breast cancer Sister   . Diabetes Brother     Social History   Socioeconomic History  . Marital status: Widowed    Spouse name: Not on file  . Number of children: Not on file  . Years of education: Not on file  . Highest education level: Not on file  Occupational History  . Not on file  Social Needs  . Financial resource strain: Not on file  . Food insecurity:    Worry: Not on file    Inability: Not on file  . Transportation needs:    Medical: Not on file    Non-medical: Not on file  Tobacco Use  . Smoking status: Never Smoker  . Smokeless tobacco: Never Used  Substance and Sexual Activity  . Alcohol use: No  . Drug use: No  . Sexual activity: Not on file  Lifestyle  . Physical activity:    Days per week: Not on file    Minutes per session: Not on file  . Stress: Not on file  Relationships  . Social connections:    Talks on phone: Not on file    Gets together: Not on file    Attends religious service: Not on file    Active member of club or organization: Not on file    Attends meetings of clubs or organizations: Not on file    Relationship status: Not on file  . Intimate partner violence:    Fear of current or ex partner: Not on file    Emotionally abused: Not on file    Physically abused: Not on file    Forced sexual activity: Not on file  Other Topics Concern  . Not on file  Social History Narrative  . Not on file    Review of Systems:  GI: Described in detail in HPI.    Gen: Denies any fever, chills, rigors, night sweats, anorexia, fatigue, weakness, malaise, involuntary weight loss, and sleep disorder CV: Denies chest pain, angina, palpitations, syncope, orthopnea, PND, peripheral edema, and claudication. Resp: Denies dyspnea, cough, sputum, wheezing, coughing up blood. GU : Denies  urinary burning, blood in urine, urinary frequency, urinary hesitancy, nocturnal urination, and urinary incontinence. MS: Denies joint pain or swelling.  Denies muscle weakness, cramps, atrophy.  Derm: Denies rash, itching, oral ulcerations, hives, unhealing ulcers.  Psych: Denies depression, anxiety, memory loss, suicidal ideation, hallucinations,  and confusion. Heme: Denies bruising, bleeding, and enlarged lymph nodes. Neuro:  Denies any headaches, dizziness, paresthesias. Endo:  Denies any problems with DM, thyroid, adrenal function.  Physical Exam: Vital signs in last 24 hours: Temp:  [97.7 F (36.5 C)-98.8 F (37.1 C)] 98.5 F (36.9 C) (02/08  1341) Pulse Rate:  [81-101] 88 (02/08 1341) Resp:  [15-24] 16 (02/08 0635) BP: (117-160)/(44-65) 127/55 (02/08 1341) SpO2:  [90 %-100 %] 98 % (02/08 1341) Weight:  [70 kg-76.7 kg] 70 kg (02/08 0635) Last BM Date: 09/18/18  General:   Alert,  Well-developed, well-nourished, pleasant and cooperative in NAD Head:  Normocephalic and atraumatic. Eyes:  mild icterus.   Conjunctiva pink. Ears:  Normal auditory acuity. Nose:  No deformity, discharge,  or lesions. Mouth:  No deformity or lesions.  Oropharynx pink & moist. Neck:  Supple; no masses or thyromegaly. Lungs:  Clear throughout to auscultation.   No wheezes, crackles, or rhonchi. No acute distress. Heart:  Regular rate and rhythm; no murmurs, clicks, rubs,  or gallops. Extremities:  Without clubbing or edema. Neurologic:  Alert and  oriented x4;  grossly normal neurologically. Skin:  Intact without significant lesions or rashes. Psych:  Alert and cooperative. Normal mood and affect. Abdomen:  Soft, nontender and nondistended. No masses, hepatosplenomegaly or hernias noted. Normal bowel sounds, without guarding, and without rebound.         Lab Results: Recent Labs    09/18/18 1840 09/19/18 0600  WBC 9.3 5.1  HGB 13.0 11.3*  HCT 40.3 36.0  PLT 256 217   BMET Recent Labs     09/18/18 1840 09/19/18 0600  NA 136 137  K 4.0 3.9  CL 100 105  CO2 24 23  GLUCOSE 123* 101*  BUN 24* 17  CREATININE 1.21* 1.08*  CALCIUM 9.8 9.0   LFT Recent Labs    09/19/18 0600  PROT 6.1*  ALBUMIN 3.2*  AST 499*  ALT 481*  ALKPHOS 123  BILITOT 3.2*  BILIDIR 1.9*  IBILI 1.3*   PT/INR No results for input(s): LABPROT, INR in the last 72 hours.  Studies/Results: Ct Abdomen Pelvis W Contrast  Result Date: 09/18/2018 CLINICAL DATA:  85 year old female with nausea vomiting and diarrhea. Generalized abdominal pain. EXAM: CT ABDOMEN AND PELVIS WITH CONTRAST TECHNIQUE: Multidetector CT imaging of the abdomen and pelvis was performed using the standard protocol following bolus administration of intravenous contrast. CONTRAST:  91mL ISOVUE-300 IOPAMIDOL (ISOVUE-300) INJECTION 61% COMPARISON:  CT Abdomen and Pelvis 10/07/2014. FINDINGS: Lower chest: Borderline to mild cardiomegaly. No pericardial effusion. Mild dependent atelectasis in both lungs. No pleural effusion. Hepatobiliary: Chronic surgically absent gallbladder, but mild intrahepatic biliary ductal dilatation today. At the confluence of the hepatic ducts there is dense calcification suggesting clustered stones on series 2, image 23. Distal to this the CBD is increased in conspicuity and there is a small 3-4 millimeter calculus in the distal CBD on coronal image 46. Superimposed liver enhancement remains within normal limits. Pancreas: Negative, pancreatic atrophy with no pancreatic ductal enlargement. Spleen: Negative, chronic splenic calcified granuloma. Adrenals/Urinary Tract: Negative adrenal glands. Bilateral renal enhancement and contrast excretion is symmetric and within normal limits. 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 Normal course of the left ureter. The duplicated right ureters seem to fuse proximal to the ureterovesical junction, and on series 2, image 65 there is a 4  millimeter stone in the distal right ureter. No periureteral stranding. This was not present in 2016. There are superimposed chronic pelvic phleboliths which appear stable. Unremarkable urinary bladder. Stomach/Bowel: Decompressed and negative rectum. Decompressed sigmoid colon with diverticulosis but no active inflammation. Decompressed descending colon. Mildly redundant transverse colon with mild retained stool. The cecum is on a lax mesentery. Mild retained stool in the right colon. No inflamed  large bowel. Diminutive or absent appendix. Negative terminal ileum. No dilated small bowel. Small gastric hiatal hernia. Decompressed stomach decompressed and negative duodenum No free air, free fluid. Vascular/Lymphatic: Aortoiliac calcified atherosclerosis. Major arterial structures are patent. Portal venous system is patent. No lymphadenopathy. Reproductive: Surgically absent uterus. Diminutive or absent ovaries. Other: No pelvic free fluid. Musculoskeletal: No acute osseous abnormality identified. IMPRESSION: 1. Surgically absent gallbladder but clustered biliary stones at the gallbladder remnant/confluence of the hepatic ducts, and superimposed Choledocholithiasis with a 3-4 mm calculus in the distal CBD. Associated acute intrahepatic biliary ductal dilatation. Recommend gastroenterology consultation. 2. Positive also for a 4 mm distal right ureteral calculus, which is new since 2016 but might be chronic as there are no secondary changes of obstructive uropathy. 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 Recommend Urology consultation. 3. Aortic Atherosclerosis (ICD10-I70.0). Electronically Signed   By: Genevie Ann M.D.   On: 09/18/2018 20:56    Impression: Clustered biliary stones at gallbladder remnant/confluence of hepatic duct with superimposed choledocholithiasis with 3-4 mm calculus distal CBD T bili 3.2/AST 499/AST 481/ALP 129 today  No evidence of fever, right upper quadrant  pain or leukocytosis-no features of cholangitis  Plan: ERCP tomorrow,clear liquid diet today and nothing by mouth post midnight. The risks( infection, bleeding, pancreatitis, perforation) and the benefits of the procedure were discussed with the patient in details. She understands and verbalizes consent. She has been started on IV Cipro and IV Flagyl.    LOS: 1 day   Ronnette Juniper, M.D. 09/19/2018, 1:44 PM  Pager [phone] If no answer or after 5 PM call [phone]

## 2018-09-19 NOTE — Progress Notes (Signed)
Pharmacy Antibiotic Note  Michelle Jones is a 85 y.o. female admitted on 09/18/2018 with intra-abdominal infection.  CT scan of the abdomen pelvis done shows multiple gallstones in the gallbladder remnant of the cholecystectomy area and also CBD stones and also intrahepatic biliary dilatation. GI consult pending. Pharmacy has been consulted for cipro dosing.  Plan: Continue Cipro 400 mg IV q12h Flagyl 500 mg IV q8h (MD) No dose adjustments needed, Pharmacy will sign off  Height: 5\' 3"  (160 cm) Weight: 154 lb 4.8 oz (70 kg) IBW/kg (Calculated) : 52.4  Temp (24hrs), Avg:98.2 F (36.8 C), Min:97.7 F (36.5 C), Max:98.8 F (37.1 C)  Recent Labs  Lab 09/18/18 1840 09/19/18 0600  WBC 9.3 5.1  CREATININE 1.21* 1.08*    Estimated Creatinine Clearance: 34.4 mL/min (A) (by C-G formula based on SCr of 1.08 mg/dL (H)).    Allergies  Allergen Reactions  . Penicillins     Has patient had a PCN reaction causing immediate rash, facial/tongue/throat swelling, SOB or lightheadedness with hypotension: no throat swelling or SOB Has patient had a PCN reaction causing severe rash involving mucus membranes or skin necrosis: unknown, patients states she had sores on her mouth Has patient had a PCN reaction that required hospitalization: no Has patient had a PCN reaction occurring within the last 10 years: no If all of the above answers are "NO", then may proceed with Cephalosp    Antimicrobials this admission: 2/7 cipro >>  2/7 flagyl >>   Dose adjustments this admission:   Microbiology results:  Surgical PCR:  Thank you for allowing pharmacy to be a part of this patient's care.  Peggyann Juba, PharmD, BCPS Pager: [phone] 09/19/2018 9:12 AM

## 2018-09-20 ENCOUNTER — Inpatient Hospital Stay (HOSPITAL_COMMUNITY): Payer: Medicare Other | Admitting: Anesthesiology

## 2018-09-20 ENCOUNTER — Inpatient Hospital Stay (HOSPITAL_COMMUNITY): Payer: Medicare Other

## 2018-09-20 ENCOUNTER — Encounter (HOSPITAL_COMMUNITY): Payer: Self-pay | Admitting: Anesthesiology

## 2018-09-20 ENCOUNTER — Encounter (HOSPITAL_COMMUNITY)
Admission: EM | Disposition: A | Discharge status: Home / Self Care | Payer: Self-pay | Source: Home / Self Care | Attending: Internal Medicine

## 2018-09-20 HISTORY — PX: REMOVAL OF STONES: SHX5545

## 2018-09-20 HISTORY — PX: PANCREATIC STENT PLACEMENT: SHX5539

## 2018-09-20 HISTORY — PX: SPHINCTEROTOMY: SHX5544

## 2018-09-20 HISTORY — PX: ERCP: SHX5425

## 2018-09-20 LAB — COMPREHENSIVE METABOLIC PANEL
ALBUMIN: 3.5 g/dL (ref 3.5–5.0)
ALT: 465 U/L — ABNORMAL HIGH (ref 0–44)
AST: 372 U/L — AB (ref 15–41)
Alkaline Phosphatase: 146 U/L — ABNORMAL HIGH (ref 38–126)
Anion gap: 8 (ref 5–15)
BUN: 10 mg/dL (ref 8–23)
CO2: 25 mmol/L (ref 22–32)
Calcium: 9 mg/dL (ref 8.9–10.3)
Chloride: 106 mmol/L (ref 98–111)
Creatinine, Ser: 1.09 mg/dL — ABNORMAL HIGH (ref 0.44–1.00)
GFR calc Af Amer: 53 mL/min — ABNORMAL LOW (ref >60)
GFR calc non Af Amer: 46 mL/min — ABNORMAL LOW (ref >60)
Glucose, Bld: 100 mg/dL — ABNORMAL HIGH (ref 70–99)
Potassium: 3.9 mmol/L (ref 3.5–5.1)
Sodium: 139 mmol/L (ref 135–145)
Total Bilirubin: 3 mg/dL — ABNORMAL HIGH (ref 0.3–1.2)
Total Protein: 6.5 g/dL (ref 6.5–8.1)

## 2018-09-20 LAB — CBC
HCT: 37.2 % (ref 36.0–46.0)
Hemoglobin: 11.6 g/dL — ABNORMAL LOW (ref 12.0–15.0)
MCH: 31.2 pg (ref 26.0–34.0)
MCHC: 31.2 g/dL (ref 30.0–36.0)
MCV: 100 fL (ref 80.0–100.0)
Platelets: 209 10*3/uL (ref 150–400)
RBC: 3.72 MIL/uL — ABNORMAL LOW (ref 3.87–5.11)
RDW: 12.8 % (ref 11.5–15.5)
WBC: 4 10*3/uL (ref 4.0–10.5)
nRBC: 0 % (ref 0.0–0.2)

## 2018-09-20 SURGERY — ERCP, WITH INTERVENTION IF INDICATED
Anesthesia: General

## 2018-09-20 MED ORDER — LACTATED RINGERS IV SOLN
INTRAVENOUS | Status: DC
  Administered 2018-09-20: 13:00:00 via INTRAVENOUS

## 2018-09-20 MED ORDER — GLUCAGON HCL RDNA (DIAGNOSTIC) 1 MG IJ SOLR
INTRAMUSCULAR | Status: AC
  Filled 2018-09-20: qty 1

## 2018-09-20 MED ORDER — SODIUM CHLORIDE 0.9 % IV SOLN
INTRAVENOUS | Status: DC | PRN
  Administered 2018-09-20: 25 mL

## 2018-09-20 MED ORDER — GLUCAGON HCL RDNA (DIAGNOSTIC) 1 MG IJ SOLR
INTRAMUSCULAR | Status: DC | PRN
  Administered 2018-09-20: 1 mg via INTRAVENOUS
  Administered 2018-09-20: .5 mg via INTRAVENOUS
  Administered 2018-09-20: 1 mg via INTRAVENOUS

## 2018-09-20 MED ORDER — FENTANYL CITRATE (PF) 100 MCG/2ML IJ SOLN
INTRAMUSCULAR | Status: AC
  Filled 2018-09-20: qty 2

## 2018-09-20 MED ORDER — PROPOFOL 10 MG/ML IV BOLUS
INTRAVENOUS | Status: AC
  Filled 2018-09-20: qty 20

## 2018-09-20 MED ORDER — INDOMETHACIN 50 MG RE SUPP
RECTAL | Status: DC | PRN
  Administered 2018-09-20: 100 mg via RECTAL

## 2018-09-20 MED ORDER — LIDOCAINE 2% (20 MG/ML) 5 ML SYRINGE
INTRAMUSCULAR | Status: DC | PRN
  Administered 2018-09-20: 40 mg via INTRAVENOUS

## 2018-09-20 MED ORDER — ROCURONIUM BROMIDE 10 MG/ML (PF) SYRINGE
PREFILLED_SYRINGE | INTRAVENOUS | Status: DC | PRN
  Administered 2018-09-20: 10 mg via INTRAVENOUS
  Administered 2018-09-20: 40 mg via INTRAVENOUS

## 2018-09-20 MED ORDER — PROPOFOL 10 MG/ML IV BOLUS
INTRAVENOUS | Status: DC | PRN
  Administered 2018-09-20: 110 mg via INTRAVENOUS

## 2018-09-20 MED ORDER — PHENYLEPHRINE 40 MCG/ML (10ML) SYRINGE FOR IV PUSH (FOR BLOOD PRESSURE SUPPORT)
PREFILLED_SYRINGE | INTRAVENOUS | Status: DC | PRN
  Administered 2018-09-20 (×2): 80 ug via INTRAVENOUS
  Administered 2018-09-20: 120 ug via INTRAVENOUS
  Administered 2018-09-20 (×2): 80 ug via INTRAVENOUS
  Administered 2018-09-20: 120 ug via INTRAVENOUS
  Administered 2018-09-20: 80 ug via INTRAVENOUS

## 2018-09-20 MED ORDER — SODIUM CHLORIDE 0.9 % IV SOLN
INTRAVENOUS | Status: AC
  Administered 2018-09-20: 15:00:00 via INTRAVENOUS

## 2018-09-20 MED ORDER — GLUCAGON HCL RDNA (DIAGNOSTIC) 1 MG IJ SOLR
INTRAMUSCULAR | Status: AC
  Filled 2018-09-20: qty 2

## 2018-09-20 MED ORDER — FENTANYL CITRATE (PF) 100 MCG/2ML IJ SOLN
INTRAMUSCULAR | Status: DC | PRN
  Administered 2018-09-20 (×2): 25 ug via INTRAVENOUS

## 2018-09-20 MED ORDER — SODIUM CHLORIDE 0.9 % IV SOLN
INTRAVENOUS | Status: AC
  Administered 2018-09-20 (×2): via INTRAVENOUS

## 2018-09-20 MED ORDER — SODIUM CHLORIDE 0.9 % IV SOLN: INTRAVENOUS | Status: DC

## 2018-09-20 MED ORDER — INDOMETHACIN 50 MG RE SUPP
RECTAL | Status: AC
  Filled 2018-09-20: qty 2

## 2018-09-20 MED ORDER — SUGAMMADEX SODIUM 200 MG/2ML IV SOLN
INTRAVENOUS | Status: DC | PRN
  Administered 2018-09-20: 280 mg via INTRAVENOUS

## 2018-09-20 MED ORDER — CIPROFLOXACIN IN D5W 400 MG/200ML IV SOLN
INTRAVENOUS | Status: AC
  Filled 2018-09-20: qty 200

## 2018-09-20 MED ORDER — ACETAMINOPHEN 325 MG PO TABS: 325.0000 mg | ORAL_TABLET | Freq: Once | ORAL | Status: DC

## 2018-09-20 MED ORDER — ACETAMINOPHEN 160 MG/5ML PO SOLN: 325.0000 mg | Freq: Once | ORAL | Status: DC

## 2018-09-20 MED ORDER — ONDANSETRON HCL 4 MG/2ML IJ SOLN
INTRAMUSCULAR | Status: DC | PRN
  Administered 2018-09-20: 4 mg via INTRAVENOUS

## 2018-09-20 MED ORDER — ACETAMINOPHEN 10 MG/ML IV SOLN: 1000.0000 mg | Freq: Once | INTRAVENOUS | Status: DC | PRN

## 2018-09-20 SURGICAL SUPPLY — 1 items: Pancreatic Stent ×1 IMPLANT

## 2018-09-20 NOTE — Anesthesia Postprocedure Evaluation (Signed)
Anesthesia Post Note  Patient: Michelle Jones  Procedure(s) Performed: ENDOSCOPIC RETROGRADE CHOLANGIOPANCREATOGRAPHY (ERCP) (N/A ) SPHINCTEROTOMY PANCREATIC STENT PLACEMENT REMOVAL OF STONES     Patient location during evaluation: PACU Anesthesia Type: General Level of consciousness: awake and alert Pain management: pain level controlled Vital Signs Assessment: post-procedure vital signs reviewed and stable Respiratory status: spontaneous breathing, nonlabored ventilation, respiratory function stable and patient connected to nasal cannula oxygen Cardiovascular status: blood pressure returned to baseline and stable Postop Assessment: no apparent nausea or vomiting Anesthetic complications: no    Last Vitals:  Vitals:   09/20/18 1249 09/20/18 1258  BP:    Pulse:    Resp:    Temp:  37.2 C  SpO2: 99%     Last Pain:  Vitals:   09/20/18 1258  TempSrc:   PainSc: 0-No pain                 Effie Berkshire

## 2018-09-20 NOTE — Op Note (Signed)
ERCP was performed for CBD stones.   Cannulation was difficult. Pancreatic stent was deployed. Sphincterotomy performed, 9-12 mm balloon used to sweep the CBD, several small stones and sludge removed subsequently. The patient was given rectal indomethacin.  Recommendations: Clear liquid diet in 6 hours if patient has no pain. Normal saline at 100 mL/h. Liver enzymes in a.m.. Abdominal x-ray to be performed in 2 weeks, if pancreatic stent has fallen off spontaneously no further treatment needed.   If pancreatic stent is still in place on x-ray in 2 weeks, will need an EGD/duodenoscopy to remove it(family members aware).  Ronnette Juniper, MD

## 2018-09-20 NOTE — Brief Op Note (Signed)
09/18/2018 - 09/20/2018  12:35 PM  PATIENT:  Michelle Jones  85 y.o. female  PRE-OPERATIVE DIAGNOSIS:  CBD stones  POST-OPERATIVE DIAGNOSIS:  pancreatic stent placement, sphinterotomy, balloon sweep, removal of stones and sludge  PROCEDURE:  Procedure(s): ENDOSCOPIC RETROGRADE CHOLANGIOPANCREATOGRAPHY (ERCP) (N/A) SPHINCTEROTOMY PANCREATIC STENT PLACEMENT REMOVAL OF STONES  SURGEON:  Surgeon(s) and Role:    Ronnette Juniper, MD - Primary  PHYSICIAN ASSISTANT:   ASSISTANTS:LIz Honeycutt, RN, Jeanella Cara, RN  ANESTHESIA:   MAC  EBL:  Minimal  BLOOD ADMINISTERED:none  DRAINS: none   LOCAL MEDICATIONS USED:  NONE  SPECIMEN:  No Specimen  DISPOSITION OF SPECIMEN:  N/A  COUNTS:  YES  TOURNIQUET:  * No tourniquets in log *  DICTATION: .Dragon Dictation  PLAN OF CARE: Admit to inpatient   PATIENT DISPOSITION:  PACU - hemodynamically stable.   Delay start of Pharmacological VTE agent (>24hrs) due to surgical blood loss or risk of bleeding: no

## 2018-09-20 NOTE — Op Note (Signed)
[facility] Patient Name: Michelle Jones Procedure Date: 09/20/2018 MRN: [id number] Attending MD: Ronnette Juniper , MD Date of Birth: Jun 24, 1930 CSN: [id number] Age: 85 Admit Type: Inpatient Procedure:                ERCP Indications:              Common bile duct stone(s) Providers:                Ronnette Juniper, MD, Jeanella Cara, RN, Elna Breslow, RN, William Dalton, Technician, Lind Covert CRNA, CRNA Referring MD:              Medicines:                Monitored Anesthesia Care Complications:            No immediate complications. Estimated blood loss:                            None Estimated Blood Loss:     Estimated blood loss was minimal. Procedure:                Pre-Anesthesia Assessment:                           - Prior to the procedure, a History and Physical                            was performed, and patient medications and                            allergies were reviewed. The patient's tolerance of                            previous anesthesia was also reviewed. The risks                            and benefits of the procedure and the sedation                            options and risks were discussed with the patient.                            All questions were answered, and informed consent                            was obtained. Prior Anticoagulants: The patient has                            taken no previous anticoagulant or antiplatelet                            agents. ASA Grade Assessment:  III - A patient with                            severe systemic disease. After reviewing the risks                            and benefits, the patient was deemed in                            satisfactory condition to undergo the procedure.                           After obtaining informed consent, the scope was                            passed under direct vision. Throughout the                     procedure, the patient's blood pressure, pulse, and                            oxygen saturations were monitored continuously. The                            [id number]-Q180V ([id number]) Olympus duodenoscope was                            introduced through the mouth, and used to inject                            contrast into and used to inject contrast into the                            bile duct. The ERCP was technically difficult and                            complex due to challenging cannulation. The patient                            tolerated the procedure well. Scope In: Scope Out: Findings:      The scout film was normal. The esophagus was successfully intubated       under direct vision. The scope was advanced to a normal major papilla in       the descending duodenum without detailed examination of the pharynx,       larynx and associated structures, and upper GI tract. The upper GI tract       was grossly normal.      Canulation was extremely challenging.      The pancreatic duct was canulated on several occasions. A wire was left       in the pancreatic duct and a precut sphincterotomy was made. Copius       amount of bile was noted post sphincterotomy, however, CBD was not       canulated with a regular sphinctertome.      The pancreatic duct was never injected.  Several attempts were made to canulate the CBD with a double wire       technique but the second wire traveled in to the same direction in the       pancreatic duct.      Subsequently, a plastic pigtail pancreatic stent 4 Fr and 3 cm in size       was deployed in the pancreatic duct and clear fluid was noted through       the pancreatic stent.      After several attempts to canulate the CBD with the PD stent in place,       the tapered sphinctertome was exchanged with a standard sphinctertome       and the CBD was successfully canulated.      A sphincteromty was made and sludge as well several  small CBD stones       gushed out. A cholangiogram was performed, which showed surgical clips       of cholecystectomy, minimally dilated intrahepatics and small filling       defects in the CBD.      A 9-12 mm balloon was used to sweep the CBD starting at the bifurcation       with retrieval of more small stones and sludge.      At the conclusion, obstructive cholangiorgram was performed which       confimred no further remaining CBD stones.      The bile duct was deeply cannulated with the tapered sphincterotome.       Contrast was injected. I personally interpreted the bile duct images.       There was brisk flow of contrast through the ducts. Image quality was       excellent. Contrast extended to the entire biliary tree. The lower third       of the main bile duct and middle third of the main bile duct contained       multiple stones, the largest of which was 4 mm in diameter. A straight       Roadrunner wire was passed into the biliary tree. A 12 mm biliary       sphincterotomy was made with a braided sphincterotome using ERBE       electrocautery. The sphincterotomy oozed minimal amount of blood. The       biliary tree was swept with a 12 mm balloon starting at the bifurcation.       Sludge was swept from the duct. Many stones were removed.      No stones remained.      One 4 Fr by 3 cm plastic stent with a single external flap and a single       internal pigtail was placed 2 cm into the ventral pancreatic duct.      Clear fluid flowed through the stent.      The stent was in good position. Impression:               - Choledocholithiasis was found. Complete removal                            was accomplished by biliary sphincterotomy and                            balloon extraction.                           -  A biliary sphincterotomy was performed.                           - The biliary tree was swept.                           - One plastic stent was placed into the ventral                             pancreatic duct.                           - Rectal indomethacin was given. Moderate Sedation:      Patient did not receive moderate sedation for this procedure, but       instead received monitored anesthesia care. Recommendation:           - Clear liquid diet for 6 hours.                           - AXR in 2 weeks, to be performed as an outpatient.                           If the pancreatic duct stent has not fallen off                            spontaneously, will need an EGD/duodenoscope to                            retrieve it.                           - Check liver enzymes (AST, ALT, alkaline                            phosphatase, bilirubin) tomorrow. Procedure Code(s):        --- Professional ---                           [phone], Endoscopic retrograde                            cholangiopancreatography (ERCP); with placement of                            endoscopic stent into biliary or pancreatic duct,                            including pre- and post-dilation and guide wire                            passage, when performed, including sphincterotomy,                            when performed, each stent  43264, Endoscopic retrograde                            cholangiopancreatography (ERCP); with removal of                            calculi/debris from biliary/pancreatic duct(s)                           43262, 59, Endoscopic retrograde                            cholangiopancreatography (ERCP); with                            sphincterotomy/papillotomy Diagnosis Code(s):        --- Professional ---                           K80.50, Calculus of bile duct without cholangitis                            or cholecystitis without obstruction CPT copyright 2018 American Medical Association. All rights reserved. The codes documented in this report are preliminary and upon coder review may  be revised to meet current compliance  requirements. Ronnette Juniper, MD 09/20/2018 12:35:20 PM This report has been signed electronically. Number of Addenda: 0

## 2018-09-20 NOTE — Anesthesia Procedure Notes (Signed)
Procedure Name: Intubation Date/Time: 09/20/2018 10:04 AM Performed by: Lind Covert, CRNA Pre-anesthesia Checklist: Patient identified, Emergency Drugs available, Suction available, Patient being monitored and Timeout performed Patient Re-evaluated:Patient Re-evaluated prior to induction Oxygen Delivery Method: Circle system utilized Preoxygenation: Pre-oxygenation with 100% oxygen Induction Type: IV induction Ventilation: Mask ventilation without difficulty Laryngoscope Size: Mac and 3 Grade View: Grade I Tube type: Oral Tube size: 7.0 mm Number of attempts: 1 Airway Equipment and Method: Stylet Placement Confirmation: ETT inserted through vocal cords under direct vision,  positive ETCO2 and breath sounds checked- equal and bilateral Secured at: 21 cm Tube secured with: Tape Dental Injury: Teeth and Oropharynx as per pre-operative assessment

## 2018-09-20 NOTE — Anesthesia Preprocedure Evaluation (Addendum)
Anesthesia Evaluation  Patient identified by MRN, date of birth, ID band Patient awake    Reviewed: Allergy & Precautions, NPO status , Patient's Chart, lab work & pertinent test results  Airway Mallampati: I  TM Distance: <3 FB     Dental  (+) Teeth Intact, Dental Advisory Given   Pulmonary    breath sounds clear to auscultation       Cardiovascular hypertension, Pt. on medications  Rhythm:Regular Rate:Normal     Neuro/Psych Depression    GI/Hepatic Neg liver ROS, GERD  ,  Endo/Other  Hypothyroidism   Renal/GU      Musculoskeletal negative musculoskeletal ROS (+)   Abdominal Normal abdominal exam  (+)   Peds  Hematology   Anesthesia Other Findings   Reproductive/Obstetrics                            Anesthesia Physical Anesthesia Plan  ASA: III  Anesthesia Plan: General   Post-op Pain Management:    Induction: Intravenous  PONV Risk Score and Plan: 4 or greater and Ondansetron and Treatment may vary due to age or medical condition  Airway Management Planned: Oral ETT  Additional Equipment: None  Intra-op Plan:   Post-operative Plan: Extubation in OR  Informed Consent:   Plan Discussed with: CRNA  Anesthesia Plan Comments:         Anesthesia Quick Evaluation

## 2018-09-20 NOTE — Progress Notes (Signed)
PROGRESS NOTE   Michelle Jones  XFG:[id number]    DOB: Oct 11, 1929    DOA: 09/18/2018  PCP: Michelle Dus, MD   I have briefly reviewed patients previous medical [facility] in [facility].  Brief Narrative:  85 year old female, lives alone, independent, PMH of HTN, HLD, hypothyroid, depression, anemia, remote cholecystectomy, presented to ED with 1 month history of on and off abdominal pain which acutely worsened on night of admission associated with nausea, dry heaves and diarrhea.  Admitted for symptomatic choledocholithiasis.  Michelle Jones consulted and for possible ERCP.  Assessment & Plan:   Principal Problem:   Choledocholithiasis Active Problems:   Essential hypertension   Hypothyroidism   Right ureteral stone   Elevated LFTs   1. Symptomatic choledocholithiasis: Remote history of cholecystectomy.  CT abdomen showed surgically absent gallbladder, clustered biliary stones at the gallbladder remnant/confluence of the hepatic ducts, choledocholithiasis including 3-4 mm calculus in distal CBD, acute intrahepatic biliary ductal dilatation.  AST and ALT in the 400s, bilirubin 3.2 prior to procedure.  Michelle Jones was consulted and patient underwent ERCP, sphincterotomy, pancreatic stent placement and removal of stones on 2/9.Diet per Jones.  Follow LFTs in a.m.  Outpatient follow-up with Jones upon discharge. 2. Right distal ureteral stone: Noted incidentally on CT abdomen.  Asymptomatic of renal colic or UTI symptoms.  I discussed with Dr. Louis Jones, urology and his input appreciated, recommends conservative management or medical expulsion therapy for her ureteral stone, Flomax, follow-up in clinic in 2 weeks with repeat KUB to ensure passage of the stone or need for further work-up. 3. Hypothyroid: Continue Synthroid. 4. Essential hypertension: PRN IV hydralazine. 5. Hyperlipidemia: Statins on hold. 6. Acute kidney injury: Presented with creatinine of 1.21.  Likely related to Jones losses and poor oral  intake.  Resolved after IV fluids. 7. Normocytic anemia: Suspect dilutional.  Stable.   DVT prophylaxis: SCDs Code Status: DNR Family Communication: Discussed in detail with patient's son and daughter-in-law at bedside, updated care and answered questions. Disposition: DC home pending clinical improvement, possibly 2/10.   Consultants:  Michelle Jones Jones [facility]  Procedures:  None  Antimicrobials:  IV Cipro and Flagyl   Subjective: Seen this afternoon after ERCP this morning.  Denies complaints.  "Can I eat a steak now?".  Denies abdominal pain or any complaints.  As per RN, no acute issues noted.  ROS: As above, otherwise negative  Objective:  Vitals:   09/20/18 1245 09/20/18 1249 09/20/18 1258 09/20/18 1300  BP:    (!) 148/63  Pulse: 89   86  Resp: (!) 21   12  Temp:   98.9 F (37.2 C)   TempSrc:      SpO2: 98% 99%  98%  Weight:      Height:        Examination:  General exam: Pleasant elderly female, moderately built and nourished lying comfortably propped up in bed.  Oral mucosa moist. Respiratory system: Clear to auscultation.  No increased work of breathing.  Stable. Cardiovascular system: S1 & S2 heard, RRR. No JVD, murmurs, rubs, gallops or clicks. No pedal edema.  Stable. Gastrointestinal system: Abdomen is nondistended, soft and nontender. No organomegaly or masses felt. Normal bowel sounds heard. Central nervous system: Alert and oriented. No focal neurological deficits. Extremities: Symmetric 5 x 5 power. Skin: No rashes, lesions or ulcers Psychiatry: Judgement and insight appear normal. Mood & affect appropriate.     Data Reviewed: I have personally reviewed following labs and imaging studies  CBC: Recent  Labs  Lab 09/18/18 1840 09/19/18 0600 09/20/18 0632  WBC 9.3 5.1 4.0  NEUTROABS 7.4  --   --   HGB 13.0 11.3* 11.6*  HCT 40.3 36.0 37.2  MCV 96.4 99.4 100.0  PLT 256 217 675   Basic Metabolic Panel: Recent Labs  Lab 09/18/18 1840  09/19/18 0600 09/20/18 0632  NA 136 137 139  K 4.0 3.9 3.9  CL 100 105 106  CO2 24 23 25   GLUCOSE 123* 101* 100*  BUN 24* 17 10  CREATININE 1.21* 1.08* 1.09*  CALCIUM 9.8 9.0 9.0   Liver Function Tests: Recent Labs  Lab 09/18/18 1840 09/19/18 0600 09/20/18 0632  AST 480* 499* 372*  ALT 362* 481* 465*  ALKPHOS 125 123 146*  BILITOT 2.3* 3.2* 3.0*  PROT 7.5 6.1* 6.5  ALBUMIN 4.1 3.2* 3.5   CBG: Recent Labs  Lab 09/19/18 0117 09/19/18 0845  GLUCAP 102* 93    Recent Results (from the past 240 hour(s))  Surgical PCR screen     Status: None   Collection Time: 09/19/18  2:44 AM  Result Value Ref Range Status   MRSA, PCR NEGATIVE NEGATIVE Final   Staphylococcus aureus NEGATIVE NEGATIVE Final    Comment: (NOTE) The Xpert SA Assay (FDA approved for NASAL specimens in patients 80 years of age and older), is one component of a comprehensive surveillance program. It is not intended to diagnose infection nor to guide or monitor treatment. Performed at [facility], [place] [address], [place], [place] [address]          Radiology Studies: Ct Abdomen Pelvis W Contrast  Result Date: 09/18/2018 CLINICAL DATA:  85 year old female with nausea vomiting and diarrhea. Generalized abdominal pain. EXAM: CT ABDOMEN AND PELVIS WITH CONTRAST TECHNIQUE: Multidetector CT imaging of the abdomen and pelvis was performed using the standard protocol following bolus administration of intravenous contrast. CONTRAST:  29mL ISOVUE-300 IOPAMIDOL (ISOVUE-300) INJECTION 61% COMPARISON:  CT Abdomen and Pelvis 10/07/2014. FINDINGS: Lower chest: Borderline to mild cardiomegaly. No pericardial effusion. Mild dependent atelectasis in both lungs. No pleural effusion. Hepatobiliary: Chronic surgically absent gallbladder, but mild intrahepatic biliary ductal dilatation today. At the confluence of the hepatic ducts there is dense calcification suggesting clustered stones on series 2, image 23.  Distal to this the CBD is increased in conspicuity and there is a small 3-4 millimeter calculus in the distal CBD on coronal image 46. Superimposed liver enhancement remains within normal limits. Pancreas: Negative, pancreatic atrophy with no pancreatic ductal enlargement. Spleen: Negative, chronic splenic calcified granuloma. Adrenals/Urinary Tract: Negative adrenal glands. Bilateral renal enhancement and contrast excretion is symmetric and within normal limits. 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 Normal course of the left ureter. The duplicated right ureters seem to fuse proximal to the ureterovesical junction, and on series 2, image 65 there is a 4 millimeter stone in the distal right ureter. No periureteral stranding. This was not present in 2016. There are superimposed chronic pelvic phleboliths which appear stable. Unremarkable urinary bladder. Stomach/Bowel: Decompressed and negative rectum. Decompressed sigmoid colon with diverticulosis but no active inflammation. Decompressed descending colon. Mildly redundant transverse colon with mild retained stool. The cecum is on a lax mesentery. Mild retained stool in the right colon. No inflamed large bowel. Diminutive or absent appendix. Negative terminal ileum. No dilated small bowel. Small gastric hiatal hernia. Decompressed stomach decompressed and negative duodenum No free air, free fluid. Vascular/Lymphatic: Aortoiliac calcified atherosclerosis. Major arterial structures are patent.  Portal venous system is patent. No lymphadenopathy. Reproductive: Surgically absent uterus. Diminutive or absent ovaries. Other: No pelvic free fluid. Musculoskeletal: No acute osseous abnormality identified. IMPRESSION: 1. Surgically absent gallbladder but clustered biliary stones at the gallbladder remnant/confluence of the hepatic ducts, and superimposed Choledocholithiasis with a 3-4 mm calculus in the distal CBD. Associated  acute intrahepatic biliary ductal dilatation. Recommend gastroenterology consultation. 2. Positive also for a 4 mm distal right ureteral calculus, which is new since 2016 but might be chronic as there are no secondary changes of obstructive uropathy. 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 Recommend Urology consultation. 3. Aortic Atherosclerosis (ICD10-I70.0). Electronically Signed   By: Genevie Ann M.D.   On: 09/18/2018 20:56   Dg Ercp Biliary & Pancreatic Ducts  Result Date: 09/20/2018 CLINICAL DATA:  85 year old female with a history of cholelithiasis/choledocholithiasis EXAM: ERCP TECHNIQUE: Multiple spot images obtained with the fluoroscopic device and submitted for interpretation post-procedure. FLUOROSCOPY TIME:  Fluoroscopy Time:  13 minutes 56 seconds COMPARISON:  CT 09/18/2018 FINDINGS: Limited intraoperative fluoroscopic spot images during ERCP. Initial image demonstrates surgical changes of cholecystectomy. Endoscope projects over the upper abdomen with cannulation of the ampulla and a safety wire in position. There is then partial opacification of the extrahepatic biliary ducts. Initial images demonstrate some filling defects, either debris/stones or gas bubbles. IMPRESSION: Limited images of ERCP. Please refer to the dictated operative report for full details of intraoperative findings and procedure. Electronically Signed   By: Corrie Mckusick D.O.   On: 09/20/2018 15:13        Scheduled Meds: . levothyroxine  50 mcg Intravenous Daily   Continuous Infusions: . ciprofloxacin 400 mg (09/19/18 2357)  . metronidazole 500 mg (09/20/18 1555)     LOS: 2 days     Vernell Leep, MD, FACP, [facility]. [facility]  To contact the attending provider between 7A-7P or the covering provider during after hours 7P-7A, please log into the web site [email or web address] and access using universal [place] password for that web site. If you do not have the password,  please call the hospital operator.  09/20/2018, 4:31 PM

## 2018-09-20 NOTE — Transfer of Care (Signed)
Immediate Anesthesia Transfer of Care Note  Patient: Michelle Jones  Procedure(s) Performed: ENDOSCOPIC RETROGRADE CHOLANGIOPANCREATOGRAPHY (ERCP) (N/A ) SPHINCTEROTOMY PANCREATIC STENT PLACEMENT REMOVAL OF STONES  Patient Location: PACU  Anesthesia Type:General  Level of Consciousness: awake, alert  and oriented  Airway & Oxygen Therapy: Patient Spontanous Breathing and Patient connected to face mask oxygen  Post-op Assessment: Report given to RN and Post -op Vital signs reviewed and stable  Post vital signs: Reviewed and stable  Last Vitals:  Vitals Value Taken Time  BP 144/59 09/20/2018 12:17 PM  Temp    Pulse 94 09/20/2018 12:22 PM  Resp 14 09/20/2018 12:22 PM  SpO2 100 % 09/20/2018 12:22 PM  Vitals shown include unvalidated device data.  Last Pain:  Vitals:   09/20/18 1217  TempSrc:   PainSc: 0-No pain         Complications: No apparent anesthesia complications

## 2018-09-21 ENCOUNTER — Encounter (HOSPITAL_COMMUNITY): Payer: Self-pay | Admitting: Gastroenterology

## 2018-09-21 LAB — HEPATIC FUNCTION PANEL
ALT: 305 U/L — AB (ref 0–44)
AST: 195 U/L — ABNORMAL HIGH (ref 15–41)
Albumin: 3.2 g/dL — ABNORMAL LOW (ref 3.5–5.0)
Alkaline Phosphatase: 143 U/L — ABNORMAL HIGH (ref 38–126)
BILIRUBIN DIRECT: 1 mg/dL — AB (ref 0.0–0.2)
Indirect Bilirubin: 0.9 mg/dL (ref 0.3–0.9)
Total Bilirubin: 1.9 mg/dL — ABNORMAL HIGH (ref 0.3–1.2)
Total Protein: 5.8 g/dL — ABNORMAL LOW (ref 6.5–8.1)

## 2018-09-21 MED ORDER — TAMSULOSIN HCL 0.4 MG PO CAPS: 0.4000 mg | ORAL_CAPSULE | Freq: Every day | ORAL | Status: DC

## 2018-09-21 MED ORDER — TAMSULOSIN HCL 0.4 MG PO CAPS: 0.4000 mg | ORAL_CAPSULE | Freq: Every day | ORAL | 0 refills | Status: DC

## 2018-09-21 NOTE — Progress Notes (Signed)
Patient taken by wheelchair to meet her daughter with the vehicle. All belongings with patient. Discharge education provided and all questions addressed. Vital signs stable, pain controlled.

## 2018-09-21 NOTE — Progress Notes (Signed)
Daughter brought patient's cane from home. I ambulated with the patient in the hall way. Patient ambulated 50 feet with the cane before we stopped and switched to the front wheeled walker. Patient ambulated 130 feet with the walker. Patient states that she has a rolling walker, a cane and a walker with a seat at home from when she broke her ankle a few years ago. Daughter confirmed. I advised patient on safe ambulating practices and reduction of fall risks. Patient compliant. Patient tolerated ambulation well with no complaints of pain or sob. Patient is clear for discharge.

## 2018-09-21 NOTE — Discharge Summary (Signed)
Physician Discharge Summary  Michelle Jones YDX:[id number] DOB: 1930-04-10  PCP: Maury Dus, MD  Admit date: 09/18/2018 Discharge date: 09/21/2018  Recommendations for Outpatient Follow-up:  1. Dr. Maury Dus, PCP in 1 week with repeat labs (CBC & CMP). 2. Dr. Ronnette Juniper, [facility] in 2 weeks.  Office will arrange follow-up with abdominal x-ray. 3. Dr. Louis Meckel, Urology in 2 weeks.  Home Health: None Equipment/Devices: None  Discharge Condition: Improved and stable CODE STATUS: DNR Diet recommendation: Heart healthy diet.  Discharge Diagnoses:  Principal Problem:   Choledocholithiasis Active Problems:   Essential hypertension   Hypothyroidism   Right ureteral stone   Elevated LFTs   Brief Summary: 85 year old female, lives alone, independent, PMH of HTN, HLD, hypothyroid, depression, anemia, remote cholecystectomy, presented to ED with 1 month history of on and off abdominal pain which acutely worsened on night of admission associated with nausea, dry heaves and diarrhea.  Admitted for symptomatic choledocholithiasis.  [facility] consulted.  Assessment & Plan:   1. Symptomatic choledocholithiasis, s/p ERCP and sphincterotomy 2/9: Remote history of cholecystectomy.  CT abdomen showed surgically absent gallbladder, clustered biliary stones at the gallbladder remnant/confluence of the hepatic ducts, choledocholithiasis including 3-4 mm calculus in distal CBD, acute intrahepatic biliary ductal dilatation.  AST and ALT in the 400s, bilirubin 3.2 prior to procedure.  [facility] was consulted and patient underwent ERCP, sphincterotomy with sludge, pancreatic duct plastic stent placement and removal of stones on 2/9.  Diet was advanced which he tolerated without symptoms.  LFTs improving.  [facility] follow-up appreciated, cleared for discharge, they will arrange for abdominal x-ray in 2 weeks as outpatient and if the pancreatic plastic stent is still in place in 2 weeks, she will need  an EGD/duodenoscopy for removal but if the pancreatic stent falls off spontaneously, no further treatment is needed. 2. Right distal ureteral stone: Noted incidentally on CT abdomen.  Asymptomatic of renal colic or UTI symptoms. Urology was consulted and recommend conservative management or medical expulsion therapy for her ureteral stone, Flomax, follow-up in clinic in 2 weeks with repeat KUB to ensure passage of the stone or need for further work-up. 3. Hypothyroid: Continue Synthroid. 4. Essential hypertension: Home antihypertensives were held while hospitalized but will be resumed at discharge. 5. Hyperlipidemia:  Continue prior home dose of statins. 6. Acute kidney injury: Presented with creatinine of 1.21.  Likely related to GI losses and poor oral intake.  Resolved after IV fluids.  Periodically follow BMP as outpatient. 7. Normocytic anemia: Suspect dilutional.  Stable.    Consultants:  [facility]  Procedures:  ERCP 2/9.   Discharge Instructions  Discharge Instructions    Call MD for:  difficulty breathing, headache or visual disturbances   Complete by:  As directed    Call MD for:  extreme fatigue   Complete by:  As directed    Call MD for:  persistant dizziness or light-headedness   Complete by:  As directed    Call MD for:  persistant nausea and vomiting   Complete by:  As directed    Call MD for:  severe uncontrolled pain   Complete by:  As directed    Call MD for:  temperature >100.4   Complete by:  As directed    Diet - low sodium heart healthy   Complete by:  As directed    Increase activity slowly   Complete by:  As directed        Medication List  STOP taking these medications   diltiazem 120 MG 24 hr capsule Commonly known as:  CARDIZEM CD     TAKE these medications   amLODipine 10 MG tablet Commonly known as:  NORVASC Take 10 mg by mouth daily.   buPROPion 300 MG 24 hr tablet Commonly known as:  WELLBUTRIN XL Take one tablet by mouth  once daily for depression   Co Q 10 100 MG Caps Take 100 mg by mouth daily.   DULoxetine 60 MG capsule Commonly known as:  CYMBALTA Take one capsule by mouth daily for depression   ferrous gluconate 225 (27 Fe) MG tablet Commonly known as:  FERGON Take 240 mg by mouth every morning.   furosemide 20 MG tablet Commonly known as:  LASIX Take 20-40 mg by mouth daily as needed for fluid.   hyoscyamine 0.125 MG tablet Commonly known as:  LEVSIN, ANASPAZ Take 0.125-0.25 mg by mouth every 4 (four) hours as needed for cramping.   levothyroxine 100 MCG tablet Commonly known as:  SYNTHROID, LEVOTHROID Take one tablet by mouth every morning before breakfast for hypothyroidism   lisinopril-hydrochlorothiazide 20-25 MG tablet Commonly known as:  PRINZIDE,ZESTORETIC Take 1 tablet by mouth daily.   oxybutynin 5 MG tablet Commonly known as:  DITROPAN Take 5 mg by mouth every 8 (eight) hours as needed for bladder spasms.   rosuvastatin 10 MG tablet Commonly known as:  CRESTOR Take 10 mg by mouth daily.   tamsulosin 0.4 MG Caps capsule Commonly known as:  FLOMAX Take 1 capsule (0.4 mg total) by mouth daily after supper.      Follow-up Information    Maury Dus, MD. Schedule an appointment as soon as possible for a visit in 1 week(s).   Specialty:  Family Medicine Why:  To be seen with repeat labs (CBC & CMP). Contact information: [place] [address] [phone]        Ronnette Juniper, MD. Schedule an appointment as soon as possible for a visit in 2 week(s).   Specialty:  Gastroenterology Why:  Office will also contact you to arrange abdominal x-ray and MD follow-up. Contact information: [place] [address] [phone]        Ardis Hughs, MD. Schedule an appointment as soon as possible for a visit in 2 week(s).   Specialty:  Urology Why:  Follow-up regarding kidney stone. Contact information: [address] [place] [address] [phone]          Allergies  Allergen Reactions  . Penicillins     Has patient had a PCN reaction causing immediate rash, facial/tongue/throat swelling, SOB or lightheadedness with hypotension: no throat swelling or SOB Has patient had a PCN reaction causing severe rash involving mucus membranes or skin necrosis: unknown, patients states she had sores on her mouth Has patient had a PCN reaction that required hospitalization: no Has patient had a PCN reaction occurring within the last 10 years: no If all of the above answers are "NO", then may proceed with Cephalosp      Procedures/Studies: Ct Abdomen Pelvis W Contrast  Result Date: 09/18/2018 CLINICAL DATA:  85 year old female with nausea vomiting and diarrhea. Generalized abdominal pain. EXAM: CT ABDOMEN AND PELVIS WITH CONTRAST TECHNIQUE: Multidetector CT imaging of the abdomen and pelvis was performed using the standard protocol following bolus administration of intravenous contrast. CONTRAST:  90mL ISOVUE-300 IOPAMIDOL (ISOVUE-300) INJECTION 61% COMPARISON:  CT Abdomen and Pelvis 10/07/2014. FINDINGS: Lower  chest: Borderline to mild cardiomegaly. No pericardial effusion. Mild dependent atelectasis in both lungs. No pleural effusion. Hepatobiliary: Chronic surgically absent gallbladder, but mild intrahepatic biliary ductal dilatation today. At the confluence of the hepatic ducts there is dense calcification suggesting clustered stones on series 2, image 23. Distal to this the CBD is increased in conspicuity and there is a small 3-4 millimeter calculus in the distal CBD on coronal image 46. Superimposed liver enhancement remains within normal limits. Pancreas: Negative, pancreatic atrophy with no pancreatic ductal enlargement. Spleen: Negative, chronic splenic calcified granuloma. Adrenals/Urinary Tract: Negative adrenal glands. Bilateral renal enhancement and contrast excretion is symmetric and within normal  limits. 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 Normal course of the left ureter. The duplicated right ureters seem to fuse proximal to the ureterovesical junction, and on series 2, image 65 there is a 4 millimeter stone in the distal right ureter. No periureteral stranding. This was not present in 2016. There are superimposed chronic pelvic phleboliths which appear stable. Unremarkable urinary bladder. Stomach/Bowel: Decompressed and negative rectum. Decompressed sigmoid colon with diverticulosis but no active inflammation. Decompressed descending colon. Mildly redundant transverse colon with mild retained stool. The cecum is on a lax mesentery. Mild retained stool in the right colon. No inflamed large bowel. Diminutive or absent appendix. Negative terminal ileum. No dilated small bowel. Small gastric hiatal hernia. Decompressed stomach decompressed and negative duodenum No free air, free fluid. Vascular/Lymphatic: Aortoiliac calcified atherosclerosis. Major arterial structures are patent. Portal venous system is patent. No lymphadenopathy. Reproductive: Surgically absent uterus. Diminutive or absent ovaries. Other: No pelvic free fluid. Musculoskeletal: No acute osseous abnormality identified. IMPRESSION: 1. Surgically absent gallbladder but clustered biliary stones at the gallbladder remnant/confluence of the hepatic ducts, and superimposed Choledocholithiasis with a 3-4 mm calculus in the distal CBD. Associated acute intrahepatic biliary ductal dilatation. Recommend gastroenterology consultation. 2. Positive also for a 4 mm distal right ureteral calculus, which is new since 2016 but might be chronic as there are no secondary changes of obstructive uropathy. 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 Recommend Urology consultation. 3. Aortic Atherosclerosis (ICD10-I70.0). Electronically Signed   By: Genevie Ann M.D.   On: 09/18/2018 20:56    Dg Ercp Biliary & Pancreatic Ducts  Result Date: 09/20/2018 CLINICAL DATA:  85 year old female with a history of cholelithiasis/choledocholithiasis EXAM: ERCP TECHNIQUE: Multiple spot images obtained with the fluoroscopic device and submitted for interpretation post-procedure. FLUOROSCOPY TIME:  Fluoroscopy Time:  13 minutes 56 seconds COMPARISON:  CT 09/18/2018 FINDINGS: Limited intraoperative fluoroscopic spot images during ERCP. Initial image demonstrates surgical changes of cholecystectomy. Endoscope projects over the upper abdomen with cannulation of the ampulla and a safety wire in position. There is then partial opacification of the extrahepatic biliary ducts. Initial images demonstrate some filling defects, either debris/stones or gas bubbles. IMPRESSION: Limited images of ERCP. Please refer to the dictated operative report for full details of intraoperative findings and procedure. Electronically Signed   By: Corrie Mckusick D.O.   On: 09/20/2018 15:13      Subjective: Patient denies complaints.  Tolerated diet without abdominal pain, nausea, vomiting, fever or chills.  As per RN report, patient independently ambulated with the help of a cane and a walker like she does at home.  Discharge Exam:  Vitals:   09/20/18 1258 09/20/18 1300 09/20/18 2047 09/21/18 0542  BP:  (!) 148/63 (!) 137/48 (!) 153/54  Pulse:  86 83 83  Resp:  12 18 20  Temp: 98.9 F (37.2 C)  97.9 F (36.6 C) 97.9 F (36.6 C)  TempSrc:   Oral Oral  SpO2:  98% 98% 97%  Weight:      Height:        General exam: Pleasant elderly female, moderately built and nourished lying comfortably propped up in bed.  Oral mucosa moist. Respiratory system: Clear to auscultation.  No increased work of breathing.   Cardiovascular system: S1 & S2 heard, RRR. No JVD, murmurs, rubs, gallops or clicks. No pedal edema. Gastrointestinal system: Abdomen is nondistended, soft and nontender. No organomegaly or masses felt. Normal bowel  sounds heard. Central nervous system: Alert and oriented. No focal neurological deficits. Extremities: Symmetric 5 x 5 power. Skin: No rashes, lesions or ulcers Psychiatry: Judgement and insight appear normal. Mood & affect appropriate.     The results of significant diagnostics from this hospitalization (including imaging, microbiology, ancillary and laboratory) are listed below for reference.     Microbiology: Recent Results (from the past 240 hour(s))  Surgical PCR screen     Status: None   Collection Time: 09/19/18  2:44 AM  Result Value Ref Range Status   MRSA, PCR NEGATIVE NEGATIVE Final   Staphylococcus aureus NEGATIVE NEGATIVE Final    Comment: (NOTE) The Xpert SA Assay (FDA approved for NASAL specimens in patients 75 years of age and older), is one component of a comprehensive surveillance program. It is not intended to diagnose infection nor to guide or monitor treatment. Performed at [facility], [address] [place], [place] [address]      Labs: CBC: Recent Labs  Lab 09/18/18 1840 09/19/18 0600 09/20/18 0632  WBC 9.3 5.1 4.0  NEUTROABS 7.4  --   --   HGB 13.0 11.3* 11.6*  HCT 40.3 36.0 37.2  MCV 96.4 99.4 100.0  PLT 256 217 413   Basic Metabolic Panel: Recent Labs  Lab 09/18/18 1840 09/19/18 0600 09/20/18 0632  NA 136 137 139  K 4.0 3.9 3.9  CL 100 105 106  CO2 24 23 25   GLUCOSE 123* 101* 100*  BUN 24* 17 10  CREATININE 1.21* 1.08* 1.09*  CALCIUM 9.8 9.0 9.0   Liver Function Tests: Recent Labs  Lab 09/18/18 1840 09/19/18 0600 09/20/18 0632 09/21/18 0615  AST 480* 499* 372* 195*  ALT 362* 481* 465* 305*  ALKPHOS 125 123 146* 143*  BILITOT 2.3* 3.2* 3.0* 1.9*  PROT 7.5 6.1* 6.5 5.8*  ALBUMIN 4.1 3.2* 3.5 3.2*   CBG: Recent Labs  Lab 09/19/18 0117 09/19/18 0845  GLUCAP 102* 93   Urinalysis    Component Value Date/Time   COLORURINE YELLOW 09/18/2018 [place] 09/18/2018 1835   LABSPEC  1.013 09/18/2018 1835   PHURINE 6.0 09/18/2018 1835   GLUCOSEU NEGATIVE 09/18/2018 1835   HGBUR NEGATIVE 09/18/2018 [place] 09/18/2018 [place] 09/18/2018 1835   PROTEINUR NEGATIVE 09/18/2018 1835   UROBILINOGEN 0.2 05/05/2015 1043   NITRITE NEGATIVE 09/18/2018 1835   LEUKOCYTESUR SMALL (A) 09/18/2018 1835      Time coordinating discharge: 40 minutes  SIGNED:  Vernell Leep, MD, FACP, [facility]. [facility]  To contact the attending provider between 7A-7P or the covering provider during after hours 7P-7A, please log into the web site [email or web address] and access using universal [place] password for that web site. If you do not have the password, please call the hospital operator.

## 2018-09-21 NOTE — Discharge Instructions (Addendum)

## 2018-09-21 NOTE — Progress Notes (Signed)
Subjective: Patient was seen and examined at bedside. She denies abdominal pain, nausea or vomiting. Requesting for a diet to be advanced.  Objective: Vital signs in last 24 hours: Temp:  [97.9 F (36.6 C)-98.9 F (37.2 C)] 97.9 F (36.6 C) (02/10 0542) Pulse Rate:  [83-96] 83 (02/10 0542) Resp:  [12-21] 20 (02/10 0542) BP: (137-161)/(48-63) 153/54 (02/10 0542) SpO2:  [96 %-100 %] 97 % (02/10 0542) Weight change:  Last BM Date: 09/19/18  HK:[id number] comfortable, no pallor GENERAL:no icterus ABDOMEN:soft, nondistended, nontender, normoactive bowel sounds EXTREMITIES:no deformity  Lab Results: Results for orders placed or performed during the hospital encounter of 09/18/18 (from the past 48 hour(s))  Glucose, capillary     Status: None   Collection Time: 09/19/18  8:45 AM  Result Value Ref Range   Glucose-Capillary 93 70 - 99 mg/dL  CBC     Status: Abnormal   Collection Time: 09/20/18  6:32 AM  Result Value Ref Range   WBC 4.0 4.0 - 10.5 K/uL   RBC 3.72 (L) 3.87 - 5.11 MIL/uL   Hemoglobin 11.6 (L) 12.0 - 15.0 g/dL   HCT 37.2 36.0 - 46.0 %   MCV 100.0 80.0 - 100.0 fL   MCH 31.2 26.0 - 34.0 pg   MCHC 31.2 30.0 - 36.0 g/dL   RDW 12.8 11.5 - 15.5 %   Platelets 209 150 - 400 K/uL   nRBC 0.0 0.0 - 0.2 %    Comment: Performed at [facility], [place] [address] [place], [place] [address]  Comprehensive metabolic panel     Status: Abnormal   Collection Time: 09/20/18  6:32 AM  Result Value Ref Range   Sodium 139 135 - 145 mmol/L   Potassium 3.9 3.5 - 5.1 mmol/L   Chloride 106 98 - 111 mmol/L   CO2 25 22 - 32 mmol/L   Glucose, Bld 100 (H) 70 - 99 mg/dL   BUN 10 8 - 23 mg/dL   Creatinine, Ser 1.09 (H) 0.44 - 1.00 mg/dL   Calcium 9.0 8.9 - 10.3 mg/dL   Total Protein 6.5 6.5 - 8.1 g/dL   Albumin 3.5 3.5 - 5.0 g/dL   AST 372 (H) 15 - 41 U/L   ALT 465 (H) 0 - 44 U/L   Alkaline Phosphatase 146 (H) 38 - 126 U/L   Total Bilirubin 3.0 (H) 0.3 - 1.2 mg/dL   GFR  calc non Af Amer 46 (L) >60 mL/min   GFR calc Af Amer 53 (L) >60 mL/min   Anion gap 8 5 - 15    Comment: Performed at [facility], [place] [address], [place], [place] [address]  Hepatic function panel     Status: Abnormal   Collection Time: 09/21/18  6:15 AM  Result Value Ref Range   Total Protein 5.8 (L) 6.5 - 8.1 g/dL   Albumin 3.2 (L) 3.5 - 5.0 g/dL   AST 195 (H) 15 - 41 U/L   ALT 305 (H) 0 - 44 U/L   Alkaline Phosphatase 143 (H) 38 - 126 U/L   Total Bilirubin 1.9 (H) 0.3 - 1.2 mg/dL   Bilirubin, Direct 1.0 (H) 0.0 - 0.2 mg/dL   Indirect Bilirubin 0.9 0.3 - 0.9 mg/dL    Comment: Performed at [facility], [place] [address], [place], [place] [address]    Studies/Results: Dg Ercp Biliary & Pancreatic Ducts  Result Date: 09/20/2018 CLINICAL DATA:  85 year old female with a history of cholelithiasis/choledocholithiasis EXAM: ERCP TECHNIQUE:  Multiple spot images obtained with the fluoroscopic device and submitted for interpretation post-procedure. FLUOROSCOPY TIME:  Fluoroscopy Time:  13 minutes 56 seconds COMPARISON:  CT 09/18/2018 FINDINGS: Limited intraoperative fluoroscopic spot images during ERCP. Initial image demonstrates surgical changes of cholecystectomy. Endoscope projects over the upper abdomen with cannulation of the ampulla and a safety wire in position. There is then partial opacification of the extrahepatic biliary ducts. Initial images demonstrate some filling defects, either debris/stones or gas bubbles. IMPRESSION: Limited images of ERCP. Please refer to the dictated operative report for full details of intraoperative findings and procedure. Electronically Signed   By: Corrie Mckusick D.O.   On: 09/20/2018 15:13    Medications: I have reviewed the patient's current medications.  Assessment: Post ERCP and sphincterotomy with sludge and CBD stone removal Pancreatic duct plastic stent placement LFTs trending down  Plan: Start regular  diet. Okay to be discharged today from GI standpoint. We will arrange for abdominal x-ray in 2 weeks as an outpatient. If the pancreatic stent is still in place in 2 weeks, will need an EGD/duodenoscopy for removal. If the pancreatic stent falls off spontaneously, no further treatment is needed. Discussed the same with the patient, she verbalizes understanding.   Ronnette Juniper 09/21/2018, 8:24 AM   Pager [phone] If no answer or after 5 PM call [phone]

## 2018-09-21 NOTE — Care Management Important Message (Signed)
Important Message  Patient Details  Name: ALLYCIA PITZ MRN: [id number] Date of Birth: [birth year]   Medicare Important Message Given:  Yes    Kerin Salen 09/21/2018, 10:58 AMImportant Message  Patient Details  Name: FERRIN LIEBIG MRN: [id number] Date of Birth: 06/19/1930   Medicare Important Message Given:  Yes    Kerin Salen 09/21/2018, 10:58 AM

## 2018-09-23 DIAGNOSIS — D649 Anemia, unspecified: Secondary | ICD-10-CM | POA: Diagnosis not present

## 2018-09-23 DIAGNOSIS — K8050 Calculus of bile duct without cholangitis or cholecystitis without obstruction: Secondary | ICD-10-CM | POA: Diagnosis not present

## 2018-09-23 DIAGNOSIS — I129 Hypertensive chronic kidney disease with stage 1 through stage 4 chronic kidney disease, or unspecified chronic kidney disease: Secondary | ICD-10-CM | POA: Diagnosis not present

## 2018-09-23 DIAGNOSIS — N201 Calculus of ureter: Secondary | ICD-10-CM | POA: Diagnosis not present

## 2018-09-23 DIAGNOSIS — N179 Acute kidney failure, unspecified: Secondary | ICD-10-CM | POA: Diagnosis not present

## 2018-09-28 ENCOUNTER — Other Ambulatory Visit: Payer: Self-pay

## 2018-09-28 NOTE — Patient Outreach (Signed)
[facility]) Care Management  09/28/2018  Michelle Jones 03/30/1930 [id number]  EMMI: general discharge red alert Referral date: 09/28/18 Referral reason: lost interest in things: yes Insurance: medicare Day # 4  Telephone call to patient regarding EMMI general discharge red alert. HIPAA verified with patient. Explained reason for call.  Patient denies having any lost of interest in things. Patient states I  Am doing very well and not having any problems. Patient states she has seen her primary MD since discharge. She reports she has a follow up appointment scheduled with the neurologist on 10/05/18.  Patient denies any pain and states her appetite is very good.  Patient states she has transportation for her appointments and she is taking her medications as prescribed. Patient denies any further needs or concerns.  RNCM advised patient to notify MD of any changes in condition prior to scheduled appointment. RNCM provided contact name and number: 24 hour nurse advise line [phone] by mail as requested.  RNCM verified patient aware of 911 services for urgent/ emergent needs.   PLAN: RNCM will close patient due to patient being assessed and having no further needs.  RNCM will mail patient [facility] care management brochure / magnet as discussed.   Quinn Plowman RN,BSN,CCM [facility] Telephonic  [phone]

## 2018-10-05 DIAGNOSIS — N209 Urinary calculus, unspecified: Secondary | ICD-10-CM | POA: Diagnosis not present

## 2018-10-05 DIAGNOSIS — N183 Chronic kidney disease, stage 3 (moderate): Secondary | ICD-10-CM | POA: Diagnosis not present

## 2018-10-05 DIAGNOSIS — N201 Calculus of ureter: Secondary | ICD-10-CM | POA: Diagnosis not present

## 2018-10-07 DIAGNOSIS — M25512 Pain in left shoulder: Secondary | ICD-10-CM | POA: Diagnosis not present

## 2018-10-08 ENCOUNTER — Ambulatory Visit
Admission: RE | Admit: 2018-10-08 | Discharge: 2018-10-08 | Disposition: A | Discharge status: Home / Self Care | Payer: Medicare Other | Source: Ambulatory Visit | Attending: Gastroenterology | Admitting: Gastroenterology

## 2018-10-08 ENCOUNTER — Other Ambulatory Visit: Payer: Self-pay | Admitting: Gastroenterology

## 2018-10-08 DIAGNOSIS — K529 Noninfective gastroenteritis and colitis, unspecified: Secondary | ICD-10-CM | POA: Diagnosis not present

## 2018-10-08 DIAGNOSIS — K8050 Calculus of bile duct without cholangitis or cholecystitis without obstruction: Secondary | ICD-10-CM

## 2018-10-12 DIAGNOSIS — Z9889 Other specified postprocedural states: Secondary | ICD-10-CM | POA: Diagnosis not present

## 2018-10-12 DIAGNOSIS — N183 Chronic kidney disease, stage 3 (moderate): Secondary | ICD-10-CM | POA: Diagnosis not present

## 2018-10-19 DIAGNOSIS — N201 Calculus of ureter: Secondary | ICD-10-CM | POA: Diagnosis not present

## 2018-10-20 DIAGNOSIS — M85852 Other specified disorders of bone density and structure, left thigh: Secondary | ICD-10-CM | POA: Diagnosis not present

## 2018-10-20 DIAGNOSIS — Z90710 Acquired absence of both cervix and uterus: Secondary | ICD-10-CM | POA: Diagnosis not present

## 2018-10-20 DIAGNOSIS — Z780 Asymptomatic menopausal state: Secondary | ICD-10-CM | POA: Diagnosis not present

## 2019-02-17 DIAGNOSIS — N183 Chronic kidney disease, stage 3 (moderate): Secondary | ICD-10-CM | POA: Diagnosis not present

## 2019-02-17 DIAGNOSIS — N3941 Urge incontinence: Secondary | ICD-10-CM | POA: Diagnosis not present

## 2019-02-17 DIAGNOSIS — E039 Hypothyroidism, unspecified: Secondary | ICD-10-CM | POA: Diagnosis not present

## 2019-02-17 DIAGNOSIS — M8589 Other specified disorders of bone density and structure, multiple sites: Secondary | ICD-10-CM | POA: Diagnosis not present

## 2019-02-17 DIAGNOSIS — I10 Essential (primary) hypertension: Secondary | ICD-10-CM | POA: Diagnosis not present

## 2019-02-17 DIAGNOSIS — E782 Mixed hyperlipidemia: Secondary | ICD-10-CM | POA: Diagnosis not present

## 2019-02-17 DIAGNOSIS — K219 Gastro-esophageal reflux disease without esophagitis: Secondary | ICD-10-CM | POA: Diagnosis not present

## 2019-02-17 DIAGNOSIS — F325 Major depressive disorder, single episode, in full remission: Secondary | ICD-10-CM | POA: Diagnosis not present

## 2019-02-17 DIAGNOSIS — M159 Polyosteoarthritis, unspecified: Secondary | ICD-10-CM | POA: Diagnosis not present

## 2019-02-17 DIAGNOSIS — R609 Edema, unspecified: Secondary | ICD-10-CM | POA: Diagnosis not present

## 2019-03-23 DIAGNOSIS — H18413 Arcus senilis, bilateral: Secondary | ICD-10-CM | POA: Diagnosis not present

## 2019-03-23 DIAGNOSIS — H04123 Dry eye syndrome of bilateral lacrimal glands: Secondary | ICD-10-CM | POA: Diagnosis not present

## 2019-03-23 DIAGNOSIS — H353132 Nonexudative age-related macular degeneration, bilateral, intermediate dry stage: Secondary | ICD-10-CM | POA: Diagnosis not present

## 2019-03-23 DIAGNOSIS — Z961 Presence of intraocular lens: Secondary | ICD-10-CM | POA: Diagnosis not present

## 2019-04-09 DIAGNOSIS — E039 Hypothyroidism, unspecified: Secondary | ICD-10-CM | POA: Diagnosis not present

## 2019-08-11 DIAGNOSIS — I10 Essential (primary) hypertension: Secondary | ICD-10-CM | POA: Diagnosis not present

## 2019-08-11 DIAGNOSIS — F325 Major depressive disorder, single episode, in full remission: Secondary | ICD-10-CM | POA: Diagnosis not present

## 2019-08-11 DIAGNOSIS — E039 Hypothyroidism, unspecified: Secondary | ICD-10-CM | POA: Diagnosis not present

## 2019-08-11 DIAGNOSIS — I129 Hypertensive chronic kidney disease with stage 1 through stage 4 chronic kidney disease, or unspecified chronic kidney disease: Secondary | ICD-10-CM | POA: Diagnosis not present

## 2019-08-11 DIAGNOSIS — E782 Mixed hyperlipidemia: Secondary | ICD-10-CM | POA: Diagnosis not present

## 2019-08-11 DIAGNOSIS — N183 Chronic kidney disease, stage 3 unspecified: Secondary | ICD-10-CM | POA: Diagnosis not present

## 2019-08-11 DIAGNOSIS — M159 Polyosteoarthritis, unspecified: Secondary | ICD-10-CM | POA: Diagnosis not present

## 2019-08-11 DIAGNOSIS — E785 Hyperlipidemia, unspecified: Secondary | ICD-10-CM | POA: Diagnosis not present

## 2019-08-26 DIAGNOSIS — R609 Edema, unspecified: Secondary | ICD-10-CM | POA: Diagnosis not present

## 2019-08-26 DIAGNOSIS — Z1389 Encounter for screening for other disorder: Secondary | ICD-10-CM | POA: Diagnosis not present

## 2019-08-26 DIAGNOSIS — Z0000 Encounter for general adult medical examination without abnormal findings: Secondary | ICD-10-CM | POA: Diagnosis not present

## 2019-08-26 DIAGNOSIS — E039 Hypothyroidism, unspecified: Secondary | ICD-10-CM | POA: Diagnosis not present

## 2019-08-26 DIAGNOSIS — N3941 Urge incontinence: Secondary | ICD-10-CM | POA: Diagnosis not present

## 2019-08-26 DIAGNOSIS — F325 Major depressive disorder, single episode, in full remission: Secondary | ICD-10-CM | POA: Diagnosis not present

## 2019-08-26 DIAGNOSIS — R7303 Prediabetes: Secondary | ICD-10-CM | POA: Diagnosis not present

## 2019-08-26 DIAGNOSIS — I10 Essential (primary) hypertension: Secondary | ICD-10-CM | POA: Diagnosis not present

## 2019-08-26 DIAGNOSIS — E782 Mixed hyperlipidemia: Secondary | ICD-10-CM | POA: Diagnosis not present

## 2019-08-26 DIAGNOSIS — K219 Gastro-esophageal reflux disease without esophagitis: Secondary | ICD-10-CM | POA: Diagnosis not present

## 2019-08-26 DIAGNOSIS — Z23 Encounter for immunization: Secondary | ICD-10-CM | POA: Diagnosis not present

## 2019-08-26 DIAGNOSIS — M159 Polyosteoarthritis, unspecified: Secondary | ICD-10-CM | POA: Diagnosis not present

## 2019-09-26 ENCOUNTER — Ambulatory Visit: Payer: Medicare Other | Attending: Internal Medicine

## 2019-09-26 DIAGNOSIS — Z23 Encounter for immunization: Secondary | ICD-10-CM | POA: Insufficient documentation

## 2019-09-26 NOTE — Progress Notes (Signed)
   Covid-19 Vaccination Clinic  Name:  Michelle Jones    MRN: [id number] DOB: 19-Oct-1929  09/26/2019  [name] was observed post Covid-19 immunization for 15 minutes without incidence. She was provided with Vaccine Information Sheet and instruction to access the V-Safe system.   [name] was instructed to call 911 with any severe reactions post vaccine: Marland Kitchen Difficulty breathing  . Swelling of your face and throat  . A fast heartbeat  . A bad rash all over your body  . Dizziness and weakness    Immunizations Administered    Name Date Dose VIS Date Route   Pfizer COVID-19 Vaccine 09/26/2019  2:40 PM 0.3 mL 07/23/2019 Intramuscular   Manufacturer: [place]   Lot: [id number]   [place]: [id number]

## 2019-10-19 ENCOUNTER — Ambulatory Visit: Payer: Medicare Other | Attending: Internal Medicine

## 2019-10-19 ENCOUNTER — Ambulatory Visit: Payer: Medicare Other

## 2019-10-19 DIAGNOSIS — Z23 Encounter for immunization: Secondary | ICD-10-CM

## 2019-10-19 NOTE — Progress Notes (Signed)
   Covid-19 Vaccination Clinic  Name:  Michelle Jones    MRN: [id number] DOB: 1930-01-24  10/19/2019  Michelle Jones was observed post Covid-19 immunization for 15 minutes without incident. She was provided with Vaccine Information Sheet and instruction to access the V-Safe system.   Michelle Jones was instructed to call 911 with any severe reactions post vaccine: Marland Kitchen Difficulty breathing  . Swelling of face and throat  . A fast heartbeat  . A bad rash all over body  . Dizziness and weakness   Immunizations Administered    Name Date Dose VIS Date Route   Pfizer COVID-19 Vaccine 10/19/2019  5:04 PM 0.3 mL 07/23/2019 Intramuscular   Manufacturer: [place]   Lot: [id number]   [place]: [id number]

## 2019-10-20 ENCOUNTER — Ambulatory Visit: Payer: Medicare Other

## 2019-11-02 DIAGNOSIS — E785 Hyperlipidemia, unspecified: Secondary | ICD-10-CM | POA: Diagnosis not present

## 2019-11-08 DIAGNOSIS — E782 Mixed hyperlipidemia: Secondary | ICD-10-CM | POA: Diagnosis not present

## 2019-11-08 DIAGNOSIS — I10 Essential (primary) hypertension: Secondary | ICD-10-CM | POA: Diagnosis not present

## 2019-11-08 DIAGNOSIS — N183 Chronic kidney disease, stage 3 unspecified: Secondary | ICD-10-CM | POA: Diagnosis not present

## 2019-11-08 DIAGNOSIS — M159 Polyosteoarthritis, unspecified: Secondary | ICD-10-CM | POA: Diagnosis not present

## 2019-11-08 DIAGNOSIS — E785 Hyperlipidemia, unspecified: Secondary | ICD-10-CM | POA: Diagnosis not present

## 2019-11-08 DIAGNOSIS — E039 Hypothyroidism, unspecified: Secondary | ICD-10-CM | POA: Diagnosis not present

## 2019-11-08 DIAGNOSIS — I129 Hypertensive chronic kidney disease with stage 1 through stage 4 chronic kidney disease, or unspecified chronic kidney disease: Secondary | ICD-10-CM | POA: Diagnosis not present

## 2019-11-08 DIAGNOSIS — F325 Major depressive disorder, single episode, in full remission: Secondary | ICD-10-CM | POA: Diagnosis not present

## 2020-01-05 DIAGNOSIS — M159 Polyosteoarthritis, unspecified: Secondary | ICD-10-CM | POA: Diagnosis not present

## 2020-01-05 DIAGNOSIS — N183 Chronic kidney disease, stage 3 unspecified: Secondary | ICD-10-CM | POA: Diagnosis not present

## 2020-01-05 DIAGNOSIS — E782 Mixed hyperlipidemia: Secondary | ICD-10-CM | POA: Diagnosis not present

## 2020-01-05 DIAGNOSIS — E785 Hyperlipidemia, unspecified: Secondary | ICD-10-CM | POA: Diagnosis not present

## 2020-01-05 DIAGNOSIS — E039 Hypothyroidism, unspecified: Secondary | ICD-10-CM | POA: Diagnosis not present

## 2020-01-05 DIAGNOSIS — I10 Essential (primary) hypertension: Secondary | ICD-10-CM | POA: Diagnosis not present

## 2020-01-05 DIAGNOSIS — F325 Major depressive disorder, single episode, in full remission: Secondary | ICD-10-CM | POA: Diagnosis not present

## 2020-01-05 DIAGNOSIS — I129 Hypertensive chronic kidney disease with stage 1 through stage 4 chronic kidney disease, or unspecified chronic kidney disease: Secondary | ICD-10-CM | POA: Diagnosis not present

## 2020-02-24 DIAGNOSIS — I10 Essential (primary) hypertension: Secondary | ICD-10-CM | POA: Diagnosis not present

## 2020-02-24 DIAGNOSIS — M159 Polyosteoarthritis, unspecified: Secondary | ICD-10-CM | POA: Diagnosis not present

## 2020-02-24 DIAGNOSIS — E039 Hypothyroidism, unspecified: Secondary | ICD-10-CM | POA: Diagnosis not present

## 2020-02-24 DIAGNOSIS — N1831 Chronic kidney disease, stage 3a: Secondary | ICD-10-CM | POA: Diagnosis not present

## 2020-02-24 DIAGNOSIS — E782 Mixed hyperlipidemia: Secondary | ICD-10-CM | POA: Diagnosis not present

## 2020-02-24 DIAGNOSIS — R7303 Prediabetes: Secondary | ICD-10-CM | POA: Diagnosis not present

## 2020-02-24 DIAGNOSIS — F325 Major depressive disorder, single episode, in full remission: Secondary | ICD-10-CM | POA: Diagnosis not present

## 2020-02-24 DIAGNOSIS — R296 Repeated falls: Secondary | ICD-10-CM | POA: Diagnosis not present

## 2020-02-24 DIAGNOSIS — R609 Edema, unspecified: Secondary | ICD-10-CM | POA: Diagnosis not present

## 2020-02-24 DIAGNOSIS — K219 Gastro-esophageal reflux disease without esophagitis: Secondary | ICD-10-CM | POA: Diagnosis not present

## 2020-02-24 DIAGNOSIS — N3941 Urge incontinence: Secondary | ICD-10-CM | POA: Diagnosis not present

## 2020-02-24 DIAGNOSIS — M8589 Other specified disorders of bone density and structure, multiple sites: Secondary | ICD-10-CM | POA: Diagnosis not present

## 2020-04-06 DIAGNOSIS — M1811 Unilateral primary osteoarthritis of first carpometacarpal joint, right hand: Secondary | ICD-10-CM | POA: Diagnosis not present

## 2020-04-06 DIAGNOSIS — M79641 Pain in right hand: Secondary | ICD-10-CM | POA: Diagnosis not present

## 2020-04-10 DIAGNOSIS — M159 Polyosteoarthritis, unspecified: Secondary | ICD-10-CM | POA: Diagnosis not present

## 2020-04-10 DIAGNOSIS — K219 Gastro-esophageal reflux disease without esophagitis: Secondary | ICD-10-CM | POA: Diagnosis not present

## 2020-04-10 DIAGNOSIS — I10 Essential (primary) hypertension: Secondary | ICD-10-CM | POA: Diagnosis not present

## 2020-04-10 DIAGNOSIS — E782 Mixed hyperlipidemia: Secondary | ICD-10-CM | POA: Diagnosis not present

## 2020-04-10 DIAGNOSIS — E785 Hyperlipidemia, unspecified: Secondary | ICD-10-CM | POA: Diagnosis not present

## 2020-04-10 DIAGNOSIS — F325 Major depressive disorder, single episode, in full remission: Secondary | ICD-10-CM | POA: Diagnosis not present

## 2020-04-10 DIAGNOSIS — N1831 Chronic kidney disease, stage 3a: Secondary | ICD-10-CM | POA: Diagnosis not present

## 2020-04-10 DIAGNOSIS — I129 Hypertensive chronic kidney disease with stage 1 through stage 4 chronic kidney disease, or unspecified chronic kidney disease: Secondary | ICD-10-CM | POA: Diagnosis not present

## 2020-04-10 DIAGNOSIS — N183 Chronic kidney disease, stage 3 unspecified: Secondary | ICD-10-CM | POA: Diagnosis not present

## 2020-04-10 DIAGNOSIS — E039 Hypothyroidism, unspecified: Secondary | ICD-10-CM | POA: Diagnosis not present

## 2020-04-20 DIAGNOSIS — H353132 Nonexudative age-related macular degeneration, bilateral, intermediate dry stage: Secondary | ICD-10-CM | POA: Diagnosis not present

## 2020-04-20 DIAGNOSIS — H18413 Arcus senilis, bilateral: Secondary | ICD-10-CM | POA: Diagnosis not present

## 2020-04-20 DIAGNOSIS — Z961 Presence of intraocular lens: Secondary | ICD-10-CM | POA: Diagnosis not present

## 2020-04-20 DIAGNOSIS — H04123 Dry eye syndrome of bilateral lacrimal glands: Secondary | ICD-10-CM | POA: Diagnosis not present

## 2020-04-28 DIAGNOSIS — M1811 Unilateral primary osteoarthritis of first carpometacarpal joint, right hand: Secondary | ICD-10-CM | POA: Diagnosis not present

## 2020-05-10 DIAGNOSIS — B079 Viral wart, unspecified: Secondary | ICD-10-CM | POA: Diagnosis not present

## 2020-05-10 DIAGNOSIS — D485 Neoplasm of uncertain behavior of skin: Secondary | ICD-10-CM | POA: Diagnosis not present

## 2020-05-10 DIAGNOSIS — L821 Other seborrheic keratosis: Secondary | ICD-10-CM | POA: Diagnosis not present

## 2020-06-06 DIAGNOSIS — E039 Hypothyroidism, unspecified: Secondary | ICD-10-CM | POA: Diagnosis not present

## 2020-06-06 DIAGNOSIS — I129 Hypertensive chronic kidney disease with stage 1 through stage 4 chronic kidney disease, or unspecified chronic kidney disease: Secondary | ICD-10-CM | POA: Diagnosis not present

## 2020-06-06 DIAGNOSIS — E785 Hyperlipidemia, unspecified: Secondary | ICD-10-CM | POA: Diagnosis not present

## 2020-06-06 DIAGNOSIS — I10 Essential (primary) hypertension: Secondary | ICD-10-CM | POA: Diagnosis not present

## 2020-06-06 DIAGNOSIS — K219 Gastro-esophageal reflux disease without esophagitis: Secondary | ICD-10-CM | POA: Diagnosis not present

## 2020-06-06 DIAGNOSIS — F325 Major depressive disorder, single episode, in full remission: Secondary | ICD-10-CM | POA: Diagnosis not present

## 2020-06-06 DIAGNOSIS — E782 Mixed hyperlipidemia: Secondary | ICD-10-CM | POA: Diagnosis not present

## 2020-06-06 DIAGNOSIS — N183 Chronic kidney disease, stage 3 unspecified: Secondary | ICD-10-CM | POA: Diagnosis not present

## 2020-06-06 DIAGNOSIS — M159 Polyosteoarthritis, unspecified: Secondary | ICD-10-CM | POA: Diagnosis not present

## 2020-06-06 DIAGNOSIS — N1831 Chronic kidney disease, stage 3a: Secondary | ICD-10-CM | POA: Diagnosis not present

## 2020-06-10 DIAGNOSIS — M545 Low back pain, unspecified: Secondary | ICD-10-CM | POA: Diagnosis not present

## 2020-06-10 DIAGNOSIS — M7061 Trochanteric bursitis, right hip: Secondary | ICD-10-CM | POA: Diagnosis not present

## 2020-06-10 DIAGNOSIS — M19031 Primary osteoarthritis, right wrist: Secondary | ICD-10-CM | POA: Diagnosis not present

## 2020-06-10 DIAGNOSIS — M25531 Pain in right wrist: Secondary | ICD-10-CM | POA: Diagnosis not present

## 2020-06-14 DIAGNOSIS — Z23 Encounter for immunization: Secondary | ICD-10-CM | POA: Diagnosis not present

## 2020-06-18 DIAGNOSIS — Z23 Encounter for immunization: Secondary | ICD-10-CM | POA: Diagnosis not present

## 2020-06-22 DIAGNOSIS — N3000 Acute cystitis without hematuria: Secondary | ICD-10-CM | POA: Diagnosis not present

## 2020-07-04 DIAGNOSIS — N390 Urinary tract infection, site not specified: Secondary | ICD-10-CM | POA: Diagnosis not present

## 2020-07-27 DIAGNOSIS — N1831 Chronic kidney disease, stage 3a: Secondary | ICD-10-CM | POA: Diagnosis not present

## 2020-07-27 DIAGNOSIS — E785 Hyperlipidemia, unspecified: Secondary | ICD-10-CM | POA: Diagnosis not present

## 2020-07-27 DIAGNOSIS — K219 Gastro-esophageal reflux disease without esophagitis: Secondary | ICD-10-CM | POA: Diagnosis not present

## 2020-07-27 DIAGNOSIS — E782 Mixed hyperlipidemia: Secondary | ICD-10-CM | POA: Diagnosis not present

## 2020-07-27 DIAGNOSIS — N183 Chronic kidney disease, stage 3 unspecified: Secondary | ICD-10-CM | POA: Diagnosis not present

## 2020-07-27 DIAGNOSIS — I10 Essential (primary) hypertension: Secondary | ICD-10-CM | POA: Diagnosis not present

## 2020-07-27 DIAGNOSIS — E039 Hypothyroidism, unspecified: Secondary | ICD-10-CM | POA: Diagnosis not present

## 2020-07-27 DIAGNOSIS — F325 Major depressive disorder, single episode, in full remission: Secondary | ICD-10-CM | POA: Diagnosis not present

## 2020-07-27 DIAGNOSIS — M159 Polyosteoarthritis, unspecified: Secondary | ICD-10-CM | POA: Diagnosis not present

## 2020-07-27 DIAGNOSIS — I129 Hypertensive chronic kidney disease with stage 1 through stage 4 chronic kidney disease, or unspecified chronic kidney disease: Secondary | ICD-10-CM | POA: Diagnosis not present

## 2020-08-31 DIAGNOSIS — E782 Mixed hyperlipidemia: Secondary | ICD-10-CM | POA: Diagnosis not present

## 2020-08-31 DIAGNOSIS — K219 Gastro-esophageal reflux disease without esophagitis: Secondary | ICD-10-CM | POA: Diagnosis not present

## 2020-08-31 DIAGNOSIS — E039 Hypothyroidism, unspecified: Secondary | ICD-10-CM | POA: Diagnosis not present

## 2020-08-31 DIAGNOSIS — R7309 Other abnormal glucose: Secondary | ICD-10-CM | POA: Diagnosis not present

## 2020-08-31 DIAGNOSIS — M159 Polyosteoarthritis, unspecified: Secondary | ICD-10-CM | POA: Diagnosis not present

## 2020-08-31 DIAGNOSIS — Z0000 Encounter for general adult medical examination without abnormal findings: Secondary | ICD-10-CM | POA: Diagnosis not present

## 2020-08-31 DIAGNOSIS — I10 Essential (primary) hypertension: Secondary | ICD-10-CM | POA: Diagnosis not present

## 2020-08-31 DIAGNOSIS — N1831 Chronic kidney disease, stage 3a: Secondary | ICD-10-CM | POA: Diagnosis not present

## 2020-08-31 DIAGNOSIS — Z23 Encounter for immunization: Secondary | ICD-10-CM | POA: Diagnosis not present

## 2020-08-31 DIAGNOSIS — N3941 Urge incontinence: Secondary | ICD-10-CM | POA: Diagnosis not present

## 2020-08-31 DIAGNOSIS — M8589 Other specified disorders of bone density and structure, multiple sites: Secondary | ICD-10-CM | POA: Diagnosis not present

## 2020-08-31 DIAGNOSIS — F325 Major depressive disorder, single episode, in full remission: Secondary | ICD-10-CM | POA: Diagnosis not present

## 2020-10-04 DIAGNOSIS — I10 Essential (primary) hypertension: Secondary | ICD-10-CM | POA: Diagnosis not present

## 2020-10-04 DIAGNOSIS — E785 Hyperlipidemia, unspecified: Secondary | ICD-10-CM | POA: Diagnosis not present

## 2020-10-04 DIAGNOSIS — N1831 Chronic kidney disease, stage 3a: Secondary | ICD-10-CM | POA: Diagnosis not present

## 2020-10-04 DIAGNOSIS — I129 Hypertensive chronic kidney disease with stage 1 through stage 4 chronic kidney disease, or unspecified chronic kidney disease: Secondary | ICD-10-CM | POA: Diagnosis not present

## 2020-10-04 DIAGNOSIS — E039 Hypothyroidism, unspecified: Secondary | ICD-10-CM | POA: Diagnosis not present

## 2020-10-04 DIAGNOSIS — E782 Mixed hyperlipidemia: Secondary | ICD-10-CM | POA: Diagnosis not present

## 2020-10-04 DIAGNOSIS — N183 Chronic kidney disease, stage 3 unspecified: Secondary | ICD-10-CM | POA: Diagnosis not present

## 2020-10-04 DIAGNOSIS — F325 Major depressive disorder, single episode, in full remission: Secondary | ICD-10-CM | POA: Diagnosis not present

## 2020-10-04 DIAGNOSIS — M159 Polyosteoarthritis, unspecified: Secondary | ICD-10-CM | POA: Diagnosis not present

## 2020-10-04 DIAGNOSIS — K219 Gastro-esophageal reflux disease without esophagitis: Secondary | ICD-10-CM | POA: Diagnosis not present

## 2021-01-19 DIAGNOSIS — R2681 Unsteadiness on feet: Secondary | ICD-10-CM | POA: Diagnosis not present

## 2021-01-19 DIAGNOSIS — M545 Low back pain, unspecified: Secondary | ICD-10-CM | POA: Diagnosis not present

## 2021-01-19 DIAGNOSIS — G8929 Other chronic pain: Secondary | ICD-10-CM | POA: Diagnosis not present

## 2021-01-19 DIAGNOSIS — R296 Repeated falls: Secondary | ICD-10-CM | POA: Diagnosis not present

## 2021-01-22 ENCOUNTER — Ambulatory Visit
Admission: RE | Admit: 2021-01-22 | Discharge: 2021-01-22 | Disposition: A | Discharge status: Home / Self Care | Payer: Medicare Other | Source: Ambulatory Visit | Attending: Physician Assistant | Admitting: Physician Assistant

## 2021-01-22 ENCOUNTER — Other Ambulatory Visit: Payer: Self-pay

## 2021-01-22 ENCOUNTER — Other Ambulatory Visit: Payer: Self-pay | Admitting: Physician Assistant

## 2021-01-22 DIAGNOSIS — M545 Low back pain, unspecified: Secondary | ICD-10-CM

## 2021-01-29 DIAGNOSIS — R350 Frequency of micturition: Secondary | ICD-10-CM | POA: Diagnosis not present

## 2021-01-29 DIAGNOSIS — N3000 Acute cystitis without hematuria: Secondary | ICD-10-CM | POA: Diagnosis not present

## 2021-01-31 DIAGNOSIS — M47816 Spondylosis without myelopathy or radiculopathy, lumbar region: Secondary | ICD-10-CM | POA: Diagnosis not present

## 2021-01-31 DIAGNOSIS — M545 Low back pain, unspecified: Secondary | ICD-10-CM | POA: Diagnosis not present

## 2021-01-31 DIAGNOSIS — Z6822 Body mass index (BMI) 22.0-22.9, adult: Secondary | ICD-10-CM | POA: Diagnosis not present

## 2021-02-03 DIAGNOSIS — K219 Gastro-esophageal reflux disease without esophagitis: Secondary | ICD-10-CM | POA: Diagnosis not present

## 2021-02-03 DIAGNOSIS — Z9049 Acquired absence of other specified parts of digestive tract: Secondary | ICD-10-CM | POA: Diagnosis not present

## 2021-02-03 DIAGNOSIS — E782 Mixed hyperlipidemia: Secondary | ICD-10-CM | POA: Diagnosis not present

## 2021-02-03 DIAGNOSIS — E039 Hypothyroidism, unspecified: Secondary | ICD-10-CM | POA: Diagnosis not present

## 2021-02-03 DIAGNOSIS — G8929 Other chronic pain: Secondary | ICD-10-CM | POA: Diagnosis not present

## 2021-02-03 DIAGNOSIS — R251 Tremor, unspecified: Secondary | ICD-10-CM | POA: Diagnosis not present

## 2021-02-03 DIAGNOSIS — Z87442 Personal history of urinary calculi: Secondary | ICD-10-CM | POA: Diagnosis not present

## 2021-02-03 DIAGNOSIS — H9190 Unspecified hearing loss, unspecified ear: Secondary | ICD-10-CM | POA: Diagnosis not present

## 2021-02-03 DIAGNOSIS — R296 Repeated falls: Secondary | ICD-10-CM | POA: Diagnosis not present

## 2021-02-03 DIAGNOSIS — N390 Urinary tract infection, site not specified: Secondary | ICD-10-CM | POA: Diagnosis not present

## 2021-02-03 DIAGNOSIS — R413 Other amnesia: Secondary | ICD-10-CM | POA: Diagnosis not present

## 2021-02-03 DIAGNOSIS — M159 Polyosteoarthritis, unspecified: Secondary | ICD-10-CM | POA: Diagnosis not present

## 2021-02-03 DIAGNOSIS — F32A Depression, unspecified: Secondary | ICD-10-CM | POA: Diagnosis not present

## 2021-02-03 DIAGNOSIS — M8580 Other specified disorders of bone density and structure, unspecified site: Secondary | ICD-10-CM | POA: Diagnosis not present

## 2021-02-03 DIAGNOSIS — G4700 Insomnia, unspecified: Secondary | ICD-10-CM | POA: Diagnosis not present

## 2021-02-03 DIAGNOSIS — Z9089 Acquired absence of other organs: Secondary | ICD-10-CM | POA: Diagnosis not present

## 2021-02-03 DIAGNOSIS — N1831 Chronic kidney disease, stage 3a: Secondary | ICD-10-CM | POA: Diagnosis not present

## 2021-02-03 DIAGNOSIS — M419 Scoliosis, unspecified: Secondary | ICD-10-CM | POA: Diagnosis not present

## 2021-02-03 DIAGNOSIS — I129 Hypertensive chronic kidney disease with stage 1 through stage 4 chronic kidney disease, or unspecified chronic kidney disease: Secondary | ICD-10-CM | POA: Diagnosis not present

## 2021-02-03 DIAGNOSIS — E781 Pure hyperglyceridemia: Secondary | ICD-10-CM | POA: Diagnosis not present

## 2021-02-03 DIAGNOSIS — Z791 Long term (current) use of non-steroidal anti-inflammatories (NSAID): Secondary | ICD-10-CM | POA: Diagnosis not present

## 2021-02-03 DIAGNOSIS — M545 Low back pain, unspecified: Secondary | ICD-10-CM | POA: Diagnosis not present

## 2021-02-03 DIAGNOSIS — N3941 Urge incontinence: Secondary | ICD-10-CM | POA: Diagnosis not present

## 2021-02-03 DIAGNOSIS — Z90710 Acquired absence of both cervix and uterus: Secondary | ICD-10-CM | POA: Diagnosis not present

## 2021-02-03 DIAGNOSIS — K5730 Diverticulosis of large intestine without perforation or abscess without bleeding: Secondary | ICD-10-CM | POA: Diagnosis not present

## 2021-02-07 DIAGNOSIS — N390 Urinary tract infection, site not specified: Secondary | ICD-10-CM | POA: Diagnosis not present

## 2021-02-07 DIAGNOSIS — N1831 Chronic kidney disease, stage 3a: Secondary | ICD-10-CM | POA: Diagnosis not present

## 2021-02-07 DIAGNOSIS — M545 Low back pain, unspecified: Secondary | ICD-10-CM | POA: Diagnosis not present

## 2021-02-07 DIAGNOSIS — I129 Hypertensive chronic kidney disease with stage 1 through stage 4 chronic kidney disease, or unspecified chronic kidney disease: Secondary | ICD-10-CM | POA: Diagnosis not present

## 2021-02-07 DIAGNOSIS — M159 Polyosteoarthritis, unspecified: Secondary | ICD-10-CM | POA: Diagnosis not present

## 2021-02-07 DIAGNOSIS — G8929 Other chronic pain: Secondary | ICD-10-CM | POA: Diagnosis not present

## 2021-02-13 DIAGNOSIS — M545 Low back pain, unspecified: Secondary | ICD-10-CM | POA: Diagnosis not present

## 2021-02-13 DIAGNOSIS — N1831 Chronic kidney disease, stage 3a: Secondary | ICD-10-CM | POA: Diagnosis not present

## 2021-02-13 DIAGNOSIS — G8929 Other chronic pain: Secondary | ICD-10-CM | POA: Diagnosis not present

## 2021-02-13 DIAGNOSIS — N390 Urinary tract infection, site not specified: Secondary | ICD-10-CM | POA: Diagnosis not present

## 2021-02-13 DIAGNOSIS — I129 Hypertensive chronic kidney disease with stage 1 through stage 4 chronic kidney disease, or unspecified chronic kidney disease: Secondary | ICD-10-CM | POA: Diagnosis not present

## 2021-02-13 DIAGNOSIS — M159 Polyosteoarthritis, unspecified: Secondary | ICD-10-CM | POA: Diagnosis not present

## 2021-02-14 DIAGNOSIS — N3020 Other chronic cystitis without hematuria: Secondary | ICD-10-CM | POA: Diagnosis not present

## 2021-02-19 DIAGNOSIS — G8929 Other chronic pain: Secondary | ICD-10-CM | POA: Diagnosis not present

## 2021-02-19 DIAGNOSIS — N1831 Chronic kidney disease, stage 3a: Secondary | ICD-10-CM | POA: Diagnosis not present

## 2021-02-19 DIAGNOSIS — M545 Low back pain, unspecified: Secondary | ICD-10-CM | POA: Diagnosis not present

## 2021-02-19 DIAGNOSIS — M159 Polyosteoarthritis, unspecified: Secondary | ICD-10-CM | POA: Diagnosis not present

## 2021-02-19 DIAGNOSIS — N390 Urinary tract infection, site not specified: Secondary | ICD-10-CM | POA: Diagnosis not present

## 2021-02-19 DIAGNOSIS — I129 Hypertensive chronic kidney disease with stage 1 through stage 4 chronic kidney disease, or unspecified chronic kidney disease: Secondary | ICD-10-CM | POA: Diagnosis not present

## 2021-02-23 DIAGNOSIS — G8929 Other chronic pain: Secondary | ICD-10-CM | POA: Diagnosis not present

## 2021-02-23 DIAGNOSIS — N1831 Chronic kidney disease, stage 3a: Secondary | ICD-10-CM | POA: Diagnosis not present

## 2021-02-23 DIAGNOSIS — N390 Urinary tract infection, site not specified: Secondary | ICD-10-CM | POA: Diagnosis not present

## 2021-02-23 DIAGNOSIS — I129 Hypertensive chronic kidney disease with stage 1 through stage 4 chronic kidney disease, or unspecified chronic kidney disease: Secondary | ICD-10-CM | POA: Diagnosis not present

## 2021-02-23 DIAGNOSIS — M545 Low back pain, unspecified: Secondary | ICD-10-CM | POA: Diagnosis not present

## 2021-02-23 DIAGNOSIS — M159 Polyosteoarthritis, unspecified: Secondary | ICD-10-CM | POA: Diagnosis not present

## 2021-02-26 DIAGNOSIS — N1831 Chronic kidney disease, stage 3a: Secondary | ICD-10-CM | POA: Diagnosis not present

## 2021-02-26 DIAGNOSIS — M159 Polyosteoarthritis, unspecified: Secondary | ICD-10-CM | POA: Diagnosis not present

## 2021-02-26 DIAGNOSIS — I129 Hypertensive chronic kidney disease with stage 1 through stage 4 chronic kidney disease, or unspecified chronic kidney disease: Secondary | ICD-10-CM | POA: Diagnosis not present

## 2021-02-26 DIAGNOSIS — G8929 Other chronic pain: Secondary | ICD-10-CM | POA: Diagnosis not present

## 2021-02-26 DIAGNOSIS — M545 Low back pain, unspecified: Secondary | ICD-10-CM | POA: Diagnosis not present

## 2021-02-26 DIAGNOSIS — N390 Urinary tract infection, site not specified: Secondary | ICD-10-CM | POA: Diagnosis not present

## 2021-02-28 DIAGNOSIS — M47816 Spondylosis without myelopathy or radiculopathy, lumbar region: Secondary | ICD-10-CM | POA: Diagnosis not present

## 2021-03-02 DIAGNOSIS — M545 Low back pain, unspecified: Secondary | ICD-10-CM | POA: Diagnosis not present

## 2021-03-02 DIAGNOSIS — N390 Urinary tract infection, site not specified: Secondary | ICD-10-CM | POA: Diagnosis not present

## 2021-03-02 DIAGNOSIS — M159 Polyosteoarthritis, unspecified: Secondary | ICD-10-CM | POA: Diagnosis not present

## 2021-03-02 DIAGNOSIS — G8929 Other chronic pain: Secondary | ICD-10-CM | POA: Diagnosis not present

## 2021-03-02 DIAGNOSIS — I129 Hypertensive chronic kidney disease with stage 1 through stage 4 chronic kidney disease, or unspecified chronic kidney disease: Secondary | ICD-10-CM | POA: Diagnosis not present

## 2021-03-02 DIAGNOSIS — N1831 Chronic kidney disease, stage 3a: Secondary | ICD-10-CM | POA: Diagnosis not present

## 2021-03-05 ENCOUNTER — Other Ambulatory Visit: Payer: Self-pay | Admitting: Rehabilitation

## 2021-03-05 DIAGNOSIS — N1831 Chronic kidney disease, stage 3a: Secondary | ICD-10-CM | POA: Diagnosis not present

## 2021-03-05 DIAGNOSIS — Z87442 Personal history of urinary calculi: Secondary | ICD-10-CM | POA: Diagnosis not present

## 2021-03-05 DIAGNOSIS — E782 Mixed hyperlipidemia: Secondary | ICD-10-CM | POA: Diagnosis not present

## 2021-03-05 DIAGNOSIS — M47816 Spondylosis without myelopathy or radiculopathy, lumbar region: Secondary | ICD-10-CM

## 2021-03-05 DIAGNOSIS — F32A Depression, unspecified: Secondary | ICD-10-CM | POA: Diagnosis not present

## 2021-03-05 DIAGNOSIS — K5730 Diverticulosis of large intestine without perforation or abscess without bleeding: Secondary | ICD-10-CM | POA: Diagnosis not present

## 2021-03-05 DIAGNOSIS — M159 Polyosteoarthritis, unspecified: Secondary | ICD-10-CM | POA: Diagnosis not present

## 2021-03-05 DIAGNOSIS — G4700 Insomnia, unspecified: Secondary | ICD-10-CM | POA: Diagnosis not present

## 2021-03-05 DIAGNOSIS — N3941 Urge incontinence: Secondary | ICD-10-CM | POA: Diagnosis not present

## 2021-03-05 DIAGNOSIS — Z90710 Acquired absence of both cervix and uterus: Secondary | ICD-10-CM | POA: Diagnosis not present

## 2021-03-05 DIAGNOSIS — Z9049 Acquired absence of other specified parts of digestive tract: Secondary | ICD-10-CM | POA: Diagnosis not present

## 2021-03-05 DIAGNOSIS — E781 Pure hyperglyceridemia: Secondary | ICD-10-CM | POA: Diagnosis not present

## 2021-03-05 DIAGNOSIS — Z9089 Acquired absence of other organs: Secondary | ICD-10-CM | POA: Diagnosis not present

## 2021-03-05 DIAGNOSIS — I129 Hypertensive chronic kidney disease with stage 1 through stage 4 chronic kidney disease, or unspecified chronic kidney disease: Secondary | ICD-10-CM | POA: Diagnosis not present

## 2021-03-05 DIAGNOSIS — N390 Urinary tract infection, site not specified: Secondary | ICD-10-CM | POA: Diagnosis not present

## 2021-03-05 DIAGNOSIS — R413 Other amnesia: Secondary | ICD-10-CM | POA: Diagnosis not present

## 2021-03-05 DIAGNOSIS — E039 Hypothyroidism, unspecified: Secondary | ICD-10-CM | POA: Diagnosis not present

## 2021-03-05 DIAGNOSIS — R296 Repeated falls: Secondary | ICD-10-CM | POA: Diagnosis not present

## 2021-03-05 DIAGNOSIS — M8580 Other specified disorders of bone density and structure, unspecified site: Secondary | ICD-10-CM | POA: Diagnosis not present

## 2021-03-05 DIAGNOSIS — M545 Low back pain, unspecified: Secondary | ICD-10-CM | POA: Diagnosis not present

## 2021-03-05 DIAGNOSIS — R251 Tremor, unspecified: Secondary | ICD-10-CM | POA: Diagnosis not present

## 2021-03-05 DIAGNOSIS — Z791 Long term (current) use of non-steroidal anti-inflammatories (NSAID): Secondary | ICD-10-CM | POA: Diagnosis not present

## 2021-03-05 DIAGNOSIS — M419 Scoliosis, unspecified: Secondary | ICD-10-CM | POA: Diagnosis not present

## 2021-03-05 DIAGNOSIS — G8929 Other chronic pain: Secondary | ICD-10-CM | POA: Diagnosis not present

## 2021-03-05 DIAGNOSIS — K219 Gastro-esophageal reflux disease without esophagitis: Secondary | ICD-10-CM | POA: Diagnosis not present

## 2021-03-05 DIAGNOSIS — H9190 Unspecified hearing loss, unspecified ear: Secondary | ICD-10-CM | POA: Diagnosis not present

## 2021-03-09 DIAGNOSIS — N390 Urinary tract infection, site not specified: Secondary | ICD-10-CM | POA: Diagnosis not present

## 2021-03-09 DIAGNOSIS — M545 Low back pain, unspecified: Secondary | ICD-10-CM | POA: Diagnosis not present

## 2021-03-09 DIAGNOSIS — M159 Polyosteoarthritis, unspecified: Secondary | ICD-10-CM | POA: Diagnosis not present

## 2021-03-09 DIAGNOSIS — G8929 Other chronic pain: Secondary | ICD-10-CM | POA: Diagnosis not present

## 2021-03-09 DIAGNOSIS — N1831 Chronic kidney disease, stage 3a: Secondary | ICD-10-CM | POA: Diagnosis not present

## 2021-03-09 DIAGNOSIS — I129 Hypertensive chronic kidney disease with stage 1 through stage 4 chronic kidney disease, or unspecified chronic kidney disease: Secondary | ICD-10-CM | POA: Diagnosis not present

## 2021-03-12 DIAGNOSIS — M545 Low back pain, unspecified: Secondary | ICD-10-CM | POA: Diagnosis not present

## 2021-03-12 DIAGNOSIS — N1831 Chronic kidney disease, stage 3a: Secondary | ICD-10-CM | POA: Diagnosis not present

## 2021-03-12 DIAGNOSIS — I129 Hypertensive chronic kidney disease with stage 1 through stage 4 chronic kidney disease, or unspecified chronic kidney disease: Secondary | ICD-10-CM | POA: Diagnosis not present

## 2021-03-12 DIAGNOSIS — N390 Urinary tract infection, site not specified: Secondary | ICD-10-CM | POA: Diagnosis not present

## 2021-03-12 DIAGNOSIS — G8929 Other chronic pain: Secondary | ICD-10-CM | POA: Diagnosis not present

## 2021-03-12 DIAGNOSIS — M159 Polyosteoarthritis, unspecified: Secondary | ICD-10-CM | POA: Diagnosis not present

## 2021-03-15 DIAGNOSIS — I129 Hypertensive chronic kidney disease with stage 1 through stage 4 chronic kidney disease, or unspecified chronic kidney disease: Secondary | ICD-10-CM | POA: Diagnosis not present

## 2021-03-15 DIAGNOSIS — R7303 Prediabetes: Secondary | ICD-10-CM | POA: Diagnosis not present

## 2021-03-15 DIAGNOSIS — R609 Edema, unspecified: Secondary | ICD-10-CM | POA: Diagnosis not present

## 2021-03-15 DIAGNOSIS — F325 Major depressive disorder, single episode, in full remission: Secondary | ICD-10-CM | POA: Diagnosis not present

## 2021-03-15 DIAGNOSIS — N3020 Other chronic cystitis without hematuria: Secondary | ICD-10-CM | POA: Diagnosis not present

## 2021-03-15 DIAGNOSIS — N3941 Urge incontinence: Secondary | ICD-10-CM | POA: Diagnosis not present

## 2021-03-15 DIAGNOSIS — N1831 Chronic kidney disease, stage 3a: Secondary | ICD-10-CM | POA: Diagnosis not present

## 2021-03-15 DIAGNOSIS — K219 Gastro-esophageal reflux disease without esophagitis: Secondary | ICD-10-CM | POA: Diagnosis not present

## 2021-03-15 DIAGNOSIS — E782 Mixed hyperlipidemia: Secondary | ICD-10-CM | POA: Diagnosis not present

## 2021-03-15 DIAGNOSIS — N390 Urinary tract infection, site not specified: Secondary | ICD-10-CM | POA: Diagnosis not present

## 2021-03-15 DIAGNOSIS — E039 Hypothyroidism, unspecified: Secondary | ICD-10-CM | POA: Diagnosis not present

## 2021-03-15 DIAGNOSIS — M159 Polyosteoarthritis, unspecified: Secondary | ICD-10-CM | POA: Diagnosis not present

## 2021-03-19 ENCOUNTER — Ambulatory Visit
Admission: RE | Admit: 2021-03-19 | Discharge: 2021-03-19 | Disposition: A | Discharge status: Home / Self Care | Payer: Medicare Other | Source: Ambulatory Visit | Attending: Rehabilitation | Admitting: Rehabilitation

## 2021-03-19 DIAGNOSIS — M545 Low back pain, unspecified: Secondary | ICD-10-CM | POA: Diagnosis not present

## 2021-03-19 DIAGNOSIS — I129 Hypertensive chronic kidney disease with stage 1 through stage 4 chronic kidney disease, or unspecified chronic kidney disease: Secondary | ICD-10-CM | POA: Diagnosis not present

## 2021-03-19 DIAGNOSIS — N1831 Chronic kidney disease, stage 3a: Secondary | ICD-10-CM | POA: Diagnosis not present

## 2021-03-19 DIAGNOSIS — N390 Urinary tract infection, site not specified: Secondary | ICD-10-CM | POA: Diagnosis not present

## 2021-03-19 DIAGNOSIS — M48061 Spinal stenosis, lumbar region without neurogenic claudication: Secondary | ICD-10-CM | POA: Diagnosis not present

## 2021-03-19 DIAGNOSIS — M159 Polyosteoarthritis, unspecified: Secondary | ICD-10-CM | POA: Diagnosis not present

## 2021-03-19 DIAGNOSIS — M47816 Spondylosis without myelopathy or radiculopathy, lumbar region: Secondary | ICD-10-CM

## 2021-03-19 DIAGNOSIS — G8929 Other chronic pain: Secondary | ICD-10-CM | POA: Diagnosis not present

## 2021-03-23 DIAGNOSIS — M792 Neuralgia and neuritis, unspecified: Secondary | ICD-10-CM | POA: Diagnosis not present

## 2021-03-28 DIAGNOSIS — M47816 Spondylosis without myelopathy or radiculopathy, lumbar region: Secondary | ICD-10-CM | POA: Diagnosis not present

## 2021-03-28 DIAGNOSIS — M48062 Spinal stenosis, lumbar region with neurogenic claudication: Secondary | ICD-10-CM | POA: Diagnosis not present

## 2021-03-30 DIAGNOSIS — M159 Polyosteoarthritis, unspecified: Secondary | ICD-10-CM | POA: Diagnosis not present

## 2021-03-30 DIAGNOSIS — N390 Urinary tract infection, site not specified: Secondary | ICD-10-CM | POA: Diagnosis not present

## 2021-03-30 DIAGNOSIS — M545 Low back pain, unspecified: Secondary | ICD-10-CM | POA: Diagnosis not present

## 2021-03-30 DIAGNOSIS — G8929 Other chronic pain: Secondary | ICD-10-CM | POA: Diagnosis not present

## 2021-03-30 DIAGNOSIS — I129 Hypertensive chronic kidney disease with stage 1 through stage 4 chronic kidney disease, or unspecified chronic kidney disease: Secondary | ICD-10-CM | POA: Diagnosis not present

## 2021-03-30 DIAGNOSIS — N1831 Chronic kidney disease, stage 3a: Secondary | ICD-10-CM | POA: Diagnosis not present

## 2021-04-04 DIAGNOSIS — E781 Pure hyperglyceridemia: Secondary | ICD-10-CM | POA: Diagnosis not present

## 2021-04-04 DIAGNOSIS — K5730 Diverticulosis of large intestine without perforation or abscess without bleeding: Secondary | ICD-10-CM | POA: Diagnosis not present

## 2021-04-04 DIAGNOSIS — M545 Low back pain, unspecified: Secondary | ICD-10-CM | POA: Diagnosis not present

## 2021-04-04 DIAGNOSIS — Z791 Long term (current) use of non-steroidal anti-inflammatories (NSAID): Secondary | ICD-10-CM | POA: Diagnosis not present

## 2021-04-04 DIAGNOSIS — K219 Gastro-esophageal reflux disease without esophagitis: Secondary | ICD-10-CM | POA: Diagnosis not present

## 2021-04-04 DIAGNOSIS — Z9181 History of falling: Secondary | ICD-10-CM | POA: Diagnosis not present

## 2021-04-04 DIAGNOSIS — N1831 Chronic kidney disease, stage 3a: Secondary | ICD-10-CM | POA: Diagnosis not present

## 2021-04-04 DIAGNOSIS — M419 Scoliosis, unspecified: Secondary | ICD-10-CM | POA: Diagnosis not present

## 2021-04-04 DIAGNOSIS — E039 Hypothyroidism, unspecified: Secondary | ICD-10-CM | POA: Diagnosis not present

## 2021-04-04 DIAGNOSIS — I129 Hypertensive chronic kidney disease with stage 1 through stage 4 chronic kidney disease, or unspecified chronic kidney disease: Secondary | ICD-10-CM | POA: Diagnosis not present

## 2021-04-04 DIAGNOSIS — R251 Tremor, unspecified: Secondary | ICD-10-CM | POA: Diagnosis not present

## 2021-04-04 DIAGNOSIS — N3941 Urge incontinence: Secondary | ICD-10-CM | POA: Diagnosis not present

## 2021-04-04 DIAGNOSIS — G4700 Insomnia, unspecified: Secondary | ICD-10-CM | POA: Diagnosis not present

## 2021-04-04 DIAGNOSIS — F32A Depression, unspecified: Secondary | ICD-10-CM | POA: Diagnosis not present

## 2021-04-04 DIAGNOSIS — M159 Polyosteoarthritis, unspecified: Secondary | ICD-10-CM | POA: Diagnosis not present

## 2021-04-04 DIAGNOSIS — R413 Other amnesia: Secondary | ICD-10-CM | POA: Diagnosis not present

## 2021-04-04 DIAGNOSIS — E782 Mixed hyperlipidemia: Secondary | ICD-10-CM | POA: Diagnosis not present

## 2021-04-04 DIAGNOSIS — R296 Repeated falls: Secondary | ICD-10-CM | POA: Diagnosis not present

## 2021-04-04 DIAGNOSIS — G8929 Other chronic pain: Secondary | ICD-10-CM | POA: Diagnosis not present

## 2021-04-04 DIAGNOSIS — M8580 Other specified disorders of bone density and structure, unspecified site: Secondary | ICD-10-CM | POA: Diagnosis not present

## 2021-04-04 DIAGNOSIS — Z87440 Personal history of urinary (tract) infections: Secondary | ICD-10-CM | POA: Diagnosis not present

## 2021-04-04 DIAGNOSIS — H9190 Unspecified hearing loss, unspecified ear: Secondary | ICD-10-CM | POA: Diagnosis not present

## 2021-04-06 DIAGNOSIS — E785 Hyperlipidemia, unspecified: Secondary | ICD-10-CM | POA: Diagnosis not present

## 2021-04-06 DIAGNOSIS — K219 Gastro-esophageal reflux disease without esophagitis: Secondary | ICD-10-CM | POA: Diagnosis not present

## 2021-04-06 DIAGNOSIS — F325 Major depressive disorder, single episode, in full remission: Secondary | ICD-10-CM | POA: Diagnosis not present

## 2021-04-06 DIAGNOSIS — E039 Hypothyroidism, unspecified: Secondary | ICD-10-CM | POA: Diagnosis not present

## 2021-04-06 DIAGNOSIS — I10 Essential (primary) hypertension: Secondary | ICD-10-CM | POA: Diagnosis not present

## 2021-04-06 DIAGNOSIS — I129 Hypertensive chronic kidney disease with stage 1 through stage 4 chronic kidney disease, or unspecified chronic kidney disease: Secondary | ICD-10-CM | POA: Diagnosis not present

## 2021-04-06 DIAGNOSIS — E782 Mixed hyperlipidemia: Secondary | ICD-10-CM | POA: Diagnosis not present

## 2021-04-06 DIAGNOSIS — M159 Polyosteoarthritis, unspecified: Secondary | ICD-10-CM | POA: Diagnosis not present

## 2021-04-06 DIAGNOSIS — N1832 Chronic kidney disease, stage 3b: Secondary | ICD-10-CM | POA: Diagnosis not present

## 2021-04-06 DIAGNOSIS — G8929 Other chronic pain: Secondary | ICD-10-CM | POA: Diagnosis not present

## 2021-04-06 DIAGNOSIS — N1831 Chronic kidney disease, stage 3a: Secondary | ICD-10-CM | POA: Diagnosis not present

## 2021-04-09 DIAGNOSIS — N1832 Chronic kidney disease, stage 3b: Secondary | ICD-10-CM | POA: Diagnosis not present

## 2021-04-09 DIAGNOSIS — N3020 Other chronic cystitis without hematuria: Secondary | ICD-10-CM | POA: Diagnosis not present

## 2021-04-11 DIAGNOSIS — M48062 Spinal stenosis, lumbar region with neurogenic claudication: Secondary | ICD-10-CM | POA: Diagnosis not present

## 2021-04-13 DIAGNOSIS — E782 Mixed hyperlipidemia: Secondary | ICD-10-CM | POA: Diagnosis not present

## 2021-04-13 DIAGNOSIS — N1831 Chronic kidney disease, stage 3a: Secondary | ICD-10-CM | POA: Diagnosis not present

## 2021-04-13 DIAGNOSIS — M545 Low back pain, unspecified: Secondary | ICD-10-CM | POA: Diagnosis not present

## 2021-04-13 DIAGNOSIS — I129 Hypertensive chronic kidney disease with stage 1 through stage 4 chronic kidney disease, or unspecified chronic kidney disease: Secondary | ICD-10-CM | POA: Diagnosis not present

## 2021-04-13 DIAGNOSIS — M159 Polyosteoarthritis, unspecified: Secondary | ICD-10-CM | POA: Diagnosis not present

## 2021-04-13 DIAGNOSIS — G8929 Other chronic pain: Secondary | ICD-10-CM | POA: Diagnosis not present

## 2021-04-20 DIAGNOSIS — H353132 Nonexudative age-related macular degeneration, bilateral, intermediate dry stage: Secondary | ICD-10-CM | POA: Diagnosis not present

## 2021-04-20 DIAGNOSIS — H04123 Dry eye syndrome of bilateral lacrimal glands: Secondary | ICD-10-CM | POA: Diagnosis not present

## 2021-04-20 DIAGNOSIS — Z961 Presence of intraocular lens: Secondary | ICD-10-CM | POA: Diagnosis not present

## 2021-04-20 DIAGNOSIS — H18593 Other hereditary corneal dystrophies, bilateral: Secondary | ICD-10-CM | POA: Diagnosis not present

## 2021-04-25 DIAGNOSIS — M545 Low back pain, unspecified: Secondary | ICD-10-CM | POA: Diagnosis not present

## 2021-04-25 DIAGNOSIS — N1831 Chronic kidney disease, stage 3a: Secondary | ICD-10-CM | POA: Diagnosis not present

## 2021-04-25 DIAGNOSIS — I129 Hypertensive chronic kidney disease with stage 1 through stage 4 chronic kidney disease, or unspecified chronic kidney disease: Secondary | ICD-10-CM | POA: Diagnosis not present

## 2021-04-25 DIAGNOSIS — M159 Polyosteoarthritis, unspecified: Secondary | ICD-10-CM | POA: Diagnosis not present

## 2021-04-25 DIAGNOSIS — E782 Mixed hyperlipidemia: Secondary | ICD-10-CM | POA: Diagnosis not present

## 2021-04-25 DIAGNOSIS — M47816 Spondylosis without myelopathy or radiculopathy, lumbar region: Secondary | ICD-10-CM | POA: Diagnosis not present

## 2021-04-25 DIAGNOSIS — G8929 Other chronic pain: Secondary | ICD-10-CM | POA: Diagnosis not present

## 2021-04-25 DIAGNOSIS — M48062 Spinal stenosis, lumbar region with neurogenic claudication: Secondary | ICD-10-CM | POA: Diagnosis not present

## 2021-04-30 DIAGNOSIS — I10 Essential (primary) hypertension: Secondary | ICD-10-CM | POA: Diagnosis not present

## 2021-04-30 DIAGNOSIS — G8929 Other chronic pain: Secondary | ICD-10-CM | POA: Diagnosis not present

## 2021-04-30 DIAGNOSIS — K219 Gastro-esophageal reflux disease without esophagitis: Secondary | ICD-10-CM | POA: Diagnosis not present

## 2021-04-30 DIAGNOSIS — E039 Hypothyroidism, unspecified: Secondary | ICD-10-CM | POA: Diagnosis not present

## 2021-04-30 DIAGNOSIS — N1831 Chronic kidney disease, stage 3a: Secondary | ICD-10-CM | POA: Diagnosis not present

## 2021-04-30 DIAGNOSIS — M159 Polyosteoarthritis, unspecified: Secondary | ICD-10-CM | POA: Diagnosis not present

## 2021-04-30 DIAGNOSIS — F325 Major depressive disorder, single episode, in full remission: Secondary | ICD-10-CM | POA: Diagnosis not present

## 2021-04-30 DIAGNOSIS — E782 Mixed hyperlipidemia: Secondary | ICD-10-CM | POA: Diagnosis not present

## 2021-04-30 DIAGNOSIS — I129 Hypertensive chronic kidney disease with stage 1 through stage 4 chronic kidney disease, or unspecified chronic kidney disease: Secondary | ICD-10-CM | POA: Diagnosis not present

## 2021-05-02 DIAGNOSIS — M48062 Spinal stenosis, lumbar region with neurogenic claudication: Secondary | ICD-10-CM | POA: Diagnosis not present

## 2021-05-04 DIAGNOSIS — E039 Hypothyroidism, unspecified: Secondary | ICD-10-CM | POA: Diagnosis not present

## 2021-05-04 DIAGNOSIS — K219 Gastro-esophageal reflux disease without esophagitis: Secondary | ICD-10-CM | POA: Diagnosis not present

## 2021-05-04 DIAGNOSIS — E781 Pure hyperglyceridemia: Secondary | ICD-10-CM | POA: Diagnosis not present

## 2021-05-04 DIAGNOSIS — G8929 Other chronic pain: Secondary | ICD-10-CM | POA: Diagnosis not present

## 2021-05-04 DIAGNOSIS — M545 Low back pain, unspecified: Secondary | ICD-10-CM | POA: Diagnosis not present

## 2021-05-04 DIAGNOSIS — H9190 Unspecified hearing loss, unspecified ear: Secondary | ICD-10-CM | POA: Diagnosis not present

## 2021-05-04 DIAGNOSIS — R413 Other amnesia: Secondary | ICD-10-CM | POA: Diagnosis not present

## 2021-05-04 DIAGNOSIS — Z791 Long term (current) use of non-steroidal anti-inflammatories (NSAID): Secondary | ICD-10-CM | POA: Diagnosis not present

## 2021-05-04 DIAGNOSIS — Z87440 Personal history of urinary (tract) infections: Secondary | ICD-10-CM | POA: Diagnosis not present

## 2021-05-04 DIAGNOSIS — F32A Depression, unspecified: Secondary | ICD-10-CM | POA: Diagnosis not present

## 2021-05-04 DIAGNOSIS — N1831 Chronic kidney disease, stage 3a: Secondary | ICD-10-CM | POA: Diagnosis not present

## 2021-05-04 DIAGNOSIS — R251 Tremor, unspecified: Secondary | ICD-10-CM | POA: Diagnosis not present

## 2021-05-04 DIAGNOSIS — M159 Polyosteoarthritis, unspecified: Secondary | ICD-10-CM | POA: Diagnosis not present

## 2021-05-04 DIAGNOSIS — Z9181 History of falling: Secondary | ICD-10-CM | POA: Diagnosis not present

## 2021-05-04 DIAGNOSIS — R296 Repeated falls: Secondary | ICD-10-CM | POA: Diagnosis not present

## 2021-05-04 DIAGNOSIS — K5730 Diverticulosis of large intestine without perforation or abscess without bleeding: Secondary | ICD-10-CM | POA: Diagnosis not present

## 2021-05-04 DIAGNOSIS — E782 Mixed hyperlipidemia: Secondary | ICD-10-CM | POA: Diagnosis not present

## 2021-05-04 DIAGNOSIS — M8580 Other specified disorders of bone density and structure, unspecified site: Secondary | ICD-10-CM | POA: Diagnosis not present

## 2021-05-04 DIAGNOSIS — N3941 Urge incontinence: Secondary | ICD-10-CM | POA: Diagnosis not present

## 2021-05-04 DIAGNOSIS — I129 Hypertensive chronic kidney disease with stage 1 through stage 4 chronic kidney disease, or unspecified chronic kidney disease: Secondary | ICD-10-CM | POA: Diagnosis not present

## 2021-05-04 DIAGNOSIS — M419 Scoliosis, unspecified: Secondary | ICD-10-CM | POA: Diagnosis not present

## 2021-05-04 DIAGNOSIS — G4700 Insomnia, unspecified: Secondary | ICD-10-CM | POA: Diagnosis not present

## 2021-05-11 DIAGNOSIS — I129 Hypertensive chronic kidney disease with stage 1 through stage 4 chronic kidney disease, or unspecified chronic kidney disease: Secondary | ICD-10-CM | POA: Diagnosis not present

## 2021-05-11 DIAGNOSIS — N1831 Chronic kidney disease, stage 3a: Secondary | ICD-10-CM | POA: Diagnosis not present

## 2021-05-11 DIAGNOSIS — M545 Low back pain, unspecified: Secondary | ICD-10-CM | POA: Diagnosis not present

## 2021-05-11 DIAGNOSIS — M159 Polyosteoarthritis, unspecified: Secondary | ICD-10-CM | POA: Diagnosis not present

## 2021-05-11 DIAGNOSIS — E782 Mixed hyperlipidemia: Secondary | ICD-10-CM | POA: Diagnosis not present

## 2021-05-11 DIAGNOSIS — G8929 Other chronic pain: Secondary | ICD-10-CM | POA: Diagnosis not present

## 2021-05-17 DIAGNOSIS — I129 Hypertensive chronic kidney disease with stage 1 through stage 4 chronic kidney disease, or unspecified chronic kidney disease: Secondary | ICD-10-CM | POA: Diagnosis not present

## 2021-05-17 DIAGNOSIS — N1831 Chronic kidney disease, stage 3a: Secondary | ICD-10-CM | POA: Diagnosis not present

## 2021-05-17 DIAGNOSIS — M159 Polyosteoarthritis, unspecified: Secondary | ICD-10-CM | POA: Diagnosis not present

## 2021-05-17 DIAGNOSIS — E782 Mixed hyperlipidemia: Secondary | ICD-10-CM | POA: Diagnosis not present

## 2021-05-17 DIAGNOSIS — G8929 Other chronic pain: Secondary | ICD-10-CM | POA: Diagnosis not present

## 2021-05-17 DIAGNOSIS — M545 Low back pain, unspecified: Secondary | ICD-10-CM | POA: Diagnosis not present

## 2021-05-23 DIAGNOSIS — M545 Low back pain, unspecified: Secondary | ICD-10-CM | POA: Diagnosis not present

## 2021-05-23 DIAGNOSIS — N1831 Chronic kidney disease, stage 3a: Secondary | ICD-10-CM | POA: Diagnosis not present

## 2021-05-23 DIAGNOSIS — I129 Hypertensive chronic kidney disease with stage 1 through stage 4 chronic kidney disease, or unspecified chronic kidney disease: Secondary | ICD-10-CM | POA: Diagnosis not present

## 2021-05-23 DIAGNOSIS — E782 Mixed hyperlipidemia: Secondary | ICD-10-CM | POA: Diagnosis not present

## 2021-05-23 DIAGNOSIS — M159 Polyosteoarthritis, unspecified: Secondary | ICD-10-CM | POA: Diagnosis not present

## 2021-05-23 DIAGNOSIS — G8929 Other chronic pain: Secondary | ICD-10-CM | POA: Diagnosis not present

## 2021-05-24 DIAGNOSIS — M48062 Spinal stenosis, lumbar region with neurogenic claudication: Secondary | ICD-10-CM | POA: Diagnosis not present

## 2021-05-24 DIAGNOSIS — M47816 Spondylosis without myelopathy or radiculopathy, lumbar region: Secondary | ICD-10-CM | POA: Diagnosis not present

## 2021-05-29 DIAGNOSIS — M159 Polyosteoarthritis, unspecified: Secondary | ICD-10-CM | POA: Diagnosis not present

## 2021-05-29 DIAGNOSIS — E782 Mixed hyperlipidemia: Secondary | ICD-10-CM | POA: Diagnosis not present

## 2021-05-29 DIAGNOSIS — I129 Hypertensive chronic kidney disease with stage 1 through stage 4 chronic kidney disease, or unspecified chronic kidney disease: Secondary | ICD-10-CM | POA: Diagnosis not present

## 2021-05-29 DIAGNOSIS — M545 Low back pain, unspecified: Secondary | ICD-10-CM | POA: Diagnosis not present

## 2021-05-29 DIAGNOSIS — G8929 Other chronic pain: Secondary | ICD-10-CM | POA: Diagnosis not present

## 2021-05-29 DIAGNOSIS — N1831 Chronic kidney disease, stage 3a: Secondary | ICD-10-CM | POA: Diagnosis not present

## 2021-06-07 DIAGNOSIS — F05 Delirium due to known physiological condition: Secondary | ICD-10-CM | POA: Diagnosis not present

## 2021-06-07 DIAGNOSIS — R8290 Unspecified abnormal findings in urine: Secondary | ICD-10-CM | POA: Diagnosis not present

## 2021-06-07 DIAGNOSIS — N390 Urinary tract infection, site not specified: Secondary | ICD-10-CM | POA: Diagnosis not present

## 2021-06-12 ENCOUNTER — Other Ambulatory Visit: Payer: Self-pay

## 2021-06-12 ENCOUNTER — Encounter (HOSPITAL_BASED_OUTPATIENT_CLINIC_OR_DEPARTMENT_OTHER): Payer: Medicare Other | Attending: Internal Medicine | Admitting: Internal Medicine

## 2021-06-12 DIAGNOSIS — E039 Hypothyroidism, unspecified: Secondary | ICD-10-CM | POA: Insufficient documentation

## 2021-06-12 DIAGNOSIS — S41101A Unspecified open wound of right upper arm, initial encounter: Secondary | ICD-10-CM | POA: Insufficient documentation

## 2021-06-12 DIAGNOSIS — W230XXA Caught, crushed, jammed, or pinched between moving objects, initial encounter: Secondary | ICD-10-CM | POA: Insufficient documentation

## 2021-06-12 DIAGNOSIS — I10 Essential (primary) hypertension: Secondary | ICD-10-CM | POA: Diagnosis not present

## 2021-06-12 NOTE — Progress Notes (Signed)
Michelle, Jones ([id number]) Visit Report for 06/12/2021 Chief Complaint Document Details Patient Name: Date of Service: Michelle Jones, Michelle Jones 06/12/2021 7:30 A M Medical Record Number: [id number] Patient Account Number: [id number] Date of Birth/Sex: Treating RN: 1930/04/30 (87 y.o. Female) Baruch Gouty Primary Care Provider: Maury Dus Other Clinician: Referring Provider: Treating Provider/Extender: Yaakov Guthrie in Treatment: 0 Information Obtained from: Patient Chief Complaint Right arm skin tear Electronic Signature(s) Signed: 06/12/2021 8:50:29 AM By: Kalman Shan DO Entered By: Kalman Shan on 06/12/2021 08:42:53 -------------------------------------------------------------------------------- Debridement Details Patient Name: Date of Service: Michelle TO Michelle Jones. 06/12/2021 7:30 A M Medical Record Number: [id number] Patient Account Number: [id number] Date of Birth/Sex: Treating RN: 1930/04/25 (87 y.o. Female) Baruch Gouty Primary Care Provider: Maury Dus Other Clinician: Referring Provider: Treating Provider/Extender: Yaakov Guthrie in Treatment: 0 Debridement Performed for Assessment: Wound #1 Right Forearm Performed By: Clinician Baruch Gouty, RN Debridement Type: Chemical/Enzymatic/Mechanical Agent Used: gauze and wound cleanser Level of Consciousness (Pre-procedure): Awake and Alert Pre-procedure Verification/Time Out No Taken: Bleeding: None Response to Treatment: Procedure was tolerated well Level of Consciousness (Post- Awake and Alert procedure): Post Debridement Measurements of Total Wound Length: (cm) 5 Width: (cm) 4.5 Depth: (cm) 0.1 Volume: (cm) 1.767 Character of Wound/Ulcer Post Debridement: Requires Further Debridement Post Procedure Diagnosis Same as Pre-procedure Electronic Signature(s) Signed: 06/12/2021 8:50:29 AM By: Kalman Shan DO Signed: 06/12/2021 4:07:03 PM By: Baruch Gouty RN, BSN Entered By:  Baruch Gouty on 06/12/2021 08:29:16 -------------------------------------------------------------------------------- HPI Details Patient Name: Date of Service: Michelle TO Michelle Jones. 06/12/2021 7:30 A M Medical Record Number: [id number] Patient Account Number: [id number] Date of Birth/Sex: Treating RN: 02/02/1930 (87 y.o. Female) Baruch Gouty Primary Care Provider: Maury Dus Other Clinician: Referring Provider: Treating Provider/Extender: Yaakov Guthrie in Treatment: 0 History of Present Illness HPI Description: Admission 06/12/2021 [name] is an 87 year old female with a past medical history of essential hypertension and hypothyroidism that presents to the clinic for a right arm skin tear that occurred 4 days ago. She states she was chasing a bug and successfully caught it but developed a skin tear. It is unclear if she fell or not. She has been using antibiotic ointment to the area. She denies signs of infection. Electronic Signature(s) Signed: 06/12/2021 8:50:29 AM By: Kalman Shan DO Entered By: Kalman Shan on 06/12/2021 08:45:14 -------------------------------------------------------------------------------- Physical Exam Details Patient Name: Date of Service: Michelle TO Michelle Jones. 06/12/2021 7:30 A M Medical Record Number: [id number] Patient Account Number: [id number] Date of Birth/Sex: Treating RN: August 17, 1929 (87 y.o. Female) Baruch Gouty Primary Care Provider: Maury Dus Other Clinician: Referring Provider: Treating Provider/Extender: Yaakov Guthrie in Treatment: 0 Constitutional respirations regular, non-labored and within target range for patient.Marland Kitchen Psychiatric pleasant and cooperative. Notes Right forearm: Skin tear with dermis and fat noted. There is overlying intact epidermis with the edges on adhered. The epidermis has a blackish blue hue. No signs of infection. No drainage noted. No wrist tenderness. No pain on flexion and  extension of the wrist. No pain to the elbow or forearm Electronic Signature(s) Signed: 06/12/2021 8:50:29 AM By: Kalman Shan DO Entered By: Kalman Shan on 06/12/2021 08:46:08 -------------------------------------------------------------------------------- Physician Orders Details Patient Name: Date of Service: Michelle TO Michelle Jones. 06/12/2021 7:30 A M Medical Record Number: [id number] Patient Account Number: [id number] Date of Birth/Sex: Treating RN: 1930/04/19 (87 y.o. Female) Baruch Gouty Primary Care Provider: Maury Dus Other Clinician: Referring Provider: Treating Provider/Extender: Yaakov Guthrie in Treatment: 0 Verbal / Phone Orders: No  Diagnosis Coding ICD-10 Coding Code Description S41.101A Unspecified open wound of right upper arm, initial encounter E03.9 Hypothyroidism, unspecified I10 Essential (primary) hypertension Follow-up Appointments Return Appointment in 1 week. Bathing/ Shower/ Hygiene May shower and wash wound with soap and water. Wound Treatment Wound #1 - Forearm Wound Laterality: Right Cleanser: Normal Saline (DME) (Generic) Every Other Day/15 Days Discharge Instructions: Cleanse the wound with Normal Saline prior to applying a clean dressing using gauze sponges, not tissue or cotton balls. Cleanser: Soap and Water Every Other Day/15 Days Discharge Instructions: May shower and wash wound with dial antibacterial soap and water prior to dressing change. Prim Dressing: FIBRACOL Plus Dressing, 2x2 in (collagen) (DME) (Dispense As Written) Every Other Day/15 Days ary Discharge Instructions: Moisten collagen with saline or hydrogel Secondary Dressing: T Non-Adherent Dressing, 3x4 in (DME) (Generic) Every Other Day/15 Days elfa Discharge Instructions: Apply over primary dressing as directed. Secured With: Child psychotherapist, Sterile 2x75 (in/in) (DME) (Generic) Every Other Day/15 Days Discharge Instructions: Secure with stretch  gauze as directed. Secured With: Paper Tape, 2x10 (in/yd) (DME) (Generic) Every Other Day/15 Days Discharge Instructions: Secure dressing with tape as directed. Electronic Signature(s) Signed: 06/12/2021 8:50:29 AM By: Kalman Shan DO Entered By: Kalman Shan on 06/12/2021 08:46:50 -------------------------------------------------------------------------------- Problem List Details Patient Name: Date of Service: Michelle TO Michelle Jones. 06/12/2021 7:30 A M Medical Record Number: [id number] Patient Account Number: [id number] Date of Birth/Sex: Treating RN: 21-Oct-1929 (87 y.o. Female) Baruch Gouty Primary Care Provider: Maury Dus Other Clinician: Referring Provider: Treating Provider/Extender: Yaakov Guthrie in Treatment: 0 Active Problems ICD-10 Encounter Code Description Active Date MDM Diagnosis S41.101A Unspecified open wound of right upper arm, initial encounter 06/12/2021 No Yes E03.9 Hypothyroidism, unspecified 06/12/2021 No Yes I10 Essential (primary) hypertension 06/12/2021 No Yes Inactive Problems Resolved Problems Electronic Signature(s) Signed: 06/12/2021 8:50:29 AM By: Kalman Shan DO Entered By: Kalman Shan on 06/12/2021 08:41:10 -------------------------------------------------------------------------------- Progress Note Details Patient Name: Date of Service: Michelle TO Michelle Jones. 06/12/2021 7:30 A M Medical Record Number: [id number] Patient Account Number: [id number] Date of Birth/Sex: Treating RN: Nov 05, 1929 (87 y.o. Female) Baruch Gouty Primary Care Provider: Maury Dus Other Clinician: Referring Provider: Treating Provider/Extender: Yaakov Guthrie in Treatment: 0 Subjective Chief Complaint Information obtained from Patient Right arm skin tear History of Present Illness (HPI) Admission 06/12/2021 [name] is an 87 year old female with a past medical history of essential hypertension and hypothyroidism that presents to  the clinic for a right arm skin tear that occurred 4 days ago. She states she was chasing a bug and successfully caught it but developed a skin tear. It is unclear if she fell or not. She has been using antibiotic ointment to the area. She denies signs of infection. Patient History Information obtained from Patient, Caregiver, Chart. Allergies penicillin (Reaction: rash in mouth) Family History Cancer - Mother,Siblings, Diabetes - Siblings, Heart Disease - Father, Hypertension - Mother, Kidney Disease - Siblings, No family history of Hereditary Spherocytosis, Lung Disease, Seizures, Stroke, Thyroid Problems, Tuberculosis. Social History Never smoker, Marital Status - Widowed, Alcohol Use - Never, Drug Use - No History, Caffeine Use - Moderate. Medical History Eyes Patient has history of Cataracts - bil removed Denies history of Glaucoma, Optic Neuritis Cardiovascular Patient has history of Hypertension Endocrine Denies history of Type I Diabetes, Type II Diabetes Genitourinary Denies history of End Stage Renal Disease Integumentary (Skin) Denies history of History of Burn Musculoskeletal Patient has history of Osteoarthritis Denies history of Gout, Rheumatoid Arthritis, Osteomyelitis Oncologic  Denies history of Received Chemotherapy, Received Radiation Psychiatric Denies history of Anorexia/bulimia, Confinement Anxiety Hospitalization/Surgery History - ERCP. - sphinterotomy. - pancreatic stent placement. - removal of gallstone. - carpel tunnel release right. - appendectomy. - tonsilectomy. - vaginal hysterectomy. Medical A Surgical History Notes nd Gastrointestinal gallstones Endocrine hypothyroidism Genitourinary right ureteral stone, CKD Musculoskeletal chronic back pain Psychiatric depression Review of Systems (ROS) Constitutional Symptoms (General Health) Denies complaints or symptoms of Fatigue, Fever, Chills, Marked Weight Change. Eyes Complains or has symptoms  of Glasses / Contacts. Denies complaints or symptoms of Dry Eyes, Vision Changes. Ear/Nose/Mouth/Throat Denies complaints or symptoms of Chronic sinus problems or rhinitis. Respiratory Denies complaints or symptoms of Chronic or frequent coughs, Shortness of Breath. Cardiovascular Denies complaints or symptoms of Chest pain. Gastrointestinal Denies complaints or symptoms of Frequent diarrhea, Nausea, Vomiting. Endocrine Denies complaints or symptoms of Heat/cold intolerance. Genitourinary Denies complaints or symptoms of Frequent urination. Integumentary (Skin) Complains or has symptoms of Wounds - right arm. Musculoskeletal Complains or has symptoms of Muscle Weakness. Denies complaints or symptoms of Muscle Pain. Neurologic Denies complaints or symptoms of Numbness/parasthesias. Psychiatric Denies complaints or symptoms of Claustrophobia, Suicidal. Objective Constitutional respirations regular, non-labored and within target range for patient.. Vitals Time Taken: 7:48 AM, Height: 63 in, Source: Stated, Weight: 145 lbs, Source: Stated, BMI: 25.7, Temperature: 97.5 F, Pulse: 64 bpm, Respiratory Rate: 18 breaths/min, Blood Pressure: 101/62 mmHg. Psychiatric pleasant and cooperative. General Notes: Right forearm: Skin tear with dermis and fat noted. There is overlying intact epidermis with the edges on adhered. The epidermis has a blackish blue hue. No signs of infection. No drainage noted. No wrist tenderness. No pain on flexion and extension of the wrist. No pain to the elbow or forearm Integumentary (Hair, Skin) Wound #1 status is Open. Original cause of wound was Trauma. The date acquired was: 06/08/2021. The wound is located on the Right Forearm. The wound measures 5cm length x 4.5cm width x 0.1cm depth; 17.671cm^2 area and 1.767cm^3 volume. There is Fat Layer (Subcutaneous Tissue) exposed. There is no tunneling or undermining noted. There is a medium amount of serosanguineous  drainage noted. The wound margin is flat and intact. There is medium (34-66%) pink granulation within the wound bed. There is a medium (34-66%) amount of necrotic tissue within the wound bed including Adherent Slough. Assessment Active Problems ICD-10 Unspecified open wound of right upper arm, initial encounter Hypothyroidism, unspecified Essential (primary) hypertension Patient presents with a skin tear that occured 4 days ago. At this time I recommended collagen every other day with dressing changes. No signs of infection on exam. Most of the epidermis is still adhered except for the most proximal section. Over time I will reassess what needs to be removed based on appearance. Follow-up in 1 week. Patient knows to call with any questions or concerns. Procedures Wound #1 Pre-procedure diagnosis of Wound #1 is a Skin T located on the Right Forearm . There was a Chemical/Enzymatic/Mechanical debridement performed by ear Baruch Gouty, RN.. Other agent used was gauze and wound cleanser. There was no bleeding. The procedure was tolerated well. Post Debridement Measurements: 5cm length x 4.5cm width x 0.1cm depth; 1.767cm^3 volume. Character of Wound/Ulcer Post Debridement requires further debridement. Post procedure Diagnosis Wound #1: Same as Pre-Procedure Plan Follow-up Appointments: Return Appointment in 1 week. Bathing/ Shower/ Hygiene: May shower and wash wound with soap and water. WOUND #1: - Forearm Wound Laterality: Right Cleanser: Normal Saline (DME) (Generic) Every Other Day/15 Days Discharge Instructions: Cleanse the wound  with Normal Saline prior to applying a clean dressing using gauze sponges, not tissue or cotton balls. Cleanser: Soap and Water Every Other Day/15 Days Discharge Instructions: May shower and wash wound with dial antibacterial soap and water prior to dressing change. Prim Dressing: FIBRACOL Plus Dressing, 2x2 in (collagen) (DME) (Dispense As Written) Every  Other Day/15 Days ary Discharge Instructions: Moisten collagen with saline or hydrogel Secondary Dressing: T Non-Adherent Dressing, 3x4 in (DME) (Generic) Every Other Day/15 Days elfa Discharge Instructions: Apply over primary dressing as directed. Secured With: Child psychotherapist, Sterile 2x75 (in/in) (DME) (Generic) Every Other Day/15 Days Discharge Instructions: Secure with stretch gauze as directed. Secured With: Paper Tape, 2x10 (in/yd) (DME) (Generic) Every Other Day/15 Days Discharge Instructions: Secure dressing with tape as directed. 1. Collagen 2. Follow-up in 1 week Electronic Signature(s) Signed: 06/12/2021 4:07:03 PM By: Baruch Gouty RN, BSN Signed: 06/12/2021 6:36:59 PM By: Kalman Shan DO Previous Signature: 06/12/2021 8:50:29 AM Version By: Kalman Shan DO Entered By: Baruch Gouty on 06/12/2021 08:56:59 -------------------------------------------------------------------------------- HxROS Details Patient Name: Date of Service: Michelle TO Michelle Jones. 06/12/2021 7:30 A M Medical Record Number: [id number] Patient Account Number: [id number] Date of Birth/Sex: Treating RN: [birth year] (87 y.o. Female) Baruch Gouty Primary Care Provider: Maury Dus Other Clinician: Referring Provider: Treating Provider/Extender: Yaakov Guthrie in Treatment: 0 Information Obtained From Patient Caregiver Chart Constitutional Symptoms (General Health) Complaints and Symptoms: Negative for: Fatigue; Fever; Chills; Marked Weight Change Eyes Complaints and Symptoms: Positive for: Glasses / Contacts Negative for: Dry Eyes; Vision Changes Medical History: Positive for: Cataracts - bil removed Negative for: Glaucoma; Optic Neuritis Ear/Nose/Mouth/Throat Complaints and Symptoms: Negative for: Chronic sinus problems or rhinitis Respiratory Complaints and Symptoms: Negative for: Chronic or frequent coughs; Shortness of Breath Cardiovascular Complaints and  Symptoms: Negative for: Chest pain Medical History: Positive for: Hypertension Gastrointestinal Complaints and Symptoms: Negative for: Frequent diarrhea; Nausea; Vomiting Medical History: Past Medical History Notes: gallstones Endocrine Complaints and Symptoms: Negative for: Heat/cold intolerance Medical History: Negative for: Type I Diabetes; Type II Diabetes Past Medical History Notes: hypothyroidism Genitourinary Complaints and Symptoms: Negative for: Frequent urination Medical History: Negative for: End Stage Renal Disease Past Medical History Notes: right ureteral stone, CKD Integumentary (Skin) Complaints and Symptoms: Positive for: Wounds - right arm Medical History: Negative for: History of Burn Musculoskeletal Complaints and Symptoms: Positive for: Muscle Weakness Negative for: Muscle Pain Medical History: Positive for: Osteoarthritis Negative for: Gout; Rheumatoid Arthritis; Osteomyelitis Past Medical History Notes: chronic back pain Neurologic Complaints and Symptoms: Negative for: Numbness/parasthesias Psychiatric Complaints and Symptoms: Negative for: Claustrophobia; Suicidal Medical History: Negative for: Anorexia/bulimia; Confinement Anxiety Past Medical History Notes: depression Hematologic/Lymphatic Immunological Oncologic Medical History: Negative for: Received Chemotherapy; Received Radiation HBO Extended History Items Eyes: Cataracts Immunizations Pneumococcal Vaccine: Received Pneumococcal Vaccination: No Implantable Devices No devices added Hospitalization / Surgery History Type of Hospitalization/Surgery ERCP sphinterotomy pancreatic stent placement removal of gallstone carpel tunnel release right appendectomy tonsilectomy vaginal hysterectomy Family and Social History Cancer: Yes - Mother,Siblings; Diabetes: Yes - Siblings; Heart Disease: Yes - Father; Hereditary Spherocytosis: No; Hypertension: Yes - Mother;  Kidney Disease: Yes - Siblings; Lung Disease: No; Seizures: No; Stroke: No; Thyroid Problems: No; Tuberculosis: No; Never smoker; Marital Status - Widowed; Alcohol Use: Never; Drug Use: No History; Caffeine Use: Moderate; Financial Concerns: No; Food, Clothing or Shelter Needs: No; Support System Lacking: No; Transportation Concerns: No Electronic Signature(s) Signed: 06/12/2021 4:07:03 PM By: Baruch Gouty RN, BSN Signed: 06/12/2021 6:36:59 PM By: Kalman Shan DO  Previous Signature: 06/12/2021 8:50:29 AM Version By: Kalman Shan DO Entered By: Baruch Gouty on 06/12/2021 08:56:36 -------------------------------------------------------------------------------- SuperBill Details Patient Name: Date of Service: Michelle TO Michelle Jones 06/12/2021 Medical Record Number: [id number] Patient Account Number: [id number] Date of Birth/Sex: Treating RN: 1929-10-10 (87 y.o. Female) Baruch Gouty Primary Care Provider: Maury Dus Other Clinician: Referring Provider: Treating Provider/Extender: Yaakov Guthrie in Treatment: 0 Diagnosis Coding ICD-10 Codes Code Description S41.101A Unspecified open wound of right upper arm, initial encounter E03.9 Hypothyroidism, unspecified I10 Essential (primary) hypertension Facility Procedures Physician Procedures : CPT4 Code Description Modifier [id number] [place] PHYS LEVEL 3 NEW PT ICD-10 Diagnosis Description S41.101A Unspecified open wound of right upper arm, initial encounter E03.9 Hypothyroidism, unspecified I10 Essential (primary) hypertension Quantity: 1 Electronic Signature(s) Signed: 06/12/2021 8:50:29 AM By: Kalman Shan DO Entered By: Kalman Shan on 06/12/2021 08:50:08

## 2021-06-12 NOTE — Progress Notes (Signed)
Michelle Jones, Michelle Jones ([id number]) Visit Report for 06/12/2021 Abuse/Suicide Risk Screen Details Patient Name: Date of Service: Michelle Jones, Michelle Jones 06/12/2021 7:30 A M Medical Record Number: [id number] Patient Account Number: [id number] Date of Birth/Sex: Treating RN: 11-04-1929 (87 y.o. Female) Baruch Gouty Primary Care Sally Reimers: Maury Dus Other Clinician: Referring Keigo Whalley: Treating Marcanthony Sleight/Extender: Yaakov Guthrie in Treatment: 0 Abuse/Suicide Risk Screen Items Answer ABUSE RISK SCREEN: Has anyone close to you tried to hurt or harm you recentlyo No Do you feel uncomfortable with anyone in your familyo No Has anyone forced you do things that you didnt want to doo No Electronic Signature(s) Signed: 06/12/2021 4:07:03 PM By: Baruch Gouty RN, BSN Entered By: Baruch Gouty on 06/12/2021 07:59:00 -------------------------------------------------------------------------------- Activities of Daily Living Details Patient Name: Date of Service: Michelle Jones, Michelle Jones 06/12/2021 7:30 A M Medical Record Number: [id number] Patient Account Number: [id number] Date of Birth/Sex: Treating RN: 11/08/1929 (87 y.o. Female) Baruch Gouty Primary Care Kohen Reither: Maury Dus Other Clinician: Referring Delsy Etzkorn: Treating Beth Goodlin/Extender: Yaakov Guthrie in Treatment: 0 Activities of Daily Living Items Answer Activities of Daily Living (Please select one for each item) Drive Automobile Not Able T Medications ake Completely Able Use T elephone Completely Able Care for Appearance Completely Able Use T oilet Completely Able Bath / Shower Completely Able Dress Self Completely Able Feed Self Completely Able Walk Need Assistance Get In / Out Bed Completely [place] Need Assistance Shop for Self Need Assistance Electronic Signature(s) Signed: 06/12/2021 4:07:03 PM By: Baruch Gouty RN, BSN Entered By: Baruch Gouty on 06/12/2021 08:00:03 -------------------------------------------------------------------------------- Education Screening Details Patient Name: Date of Service: Michelle TO [name] 06/12/2021 7:30 A M Medical Record Number: [id number] Patient Account Number: [id number] Date of Birth/Sex: Treating RN: 1929/10/18 (87 y.o. Female) Baruch Gouty Primary Care Meisha Salone: Maury Dus Other Clinician: Referring Maalle Starrett: Treating Sana Tessmer/Extender: Yaakov Guthrie in Treatment: 0 Primary Learner Assessed: Patient Learning Preferences/Education Level/Primary Language Learning Preference: Explanation, Demonstration, Printed Material Highest Education Level: College or Above Preferred Language: English Cognitive Barrier Language Barrier: No Translator Needed: No Memory Deficit: No Emotional Barrier: No Cultural/Religious Beliefs Affecting Medical Care: No Physical Barrier Impaired Vision: Yes Glasses Impaired Hearing: No Decreased Hand dexterity: No Knowledge/Comprehension Knowledge Level: High Comprehension Level: High Ability to understand written instructions: High Ability to understand verbal instructions: High Motivation Anxiety Level: Calm Cooperation: Cooperative Education Importance: Acknowledges Need Interest in Health Problems: Asks Questions Perception: Coherent Willingness to Engage in Self-Management High Activities: Readiness to Engage in Self-Management High Activities: Electronic Signature(s) Signed: 06/12/2021 4:07:03 PM By: Baruch Gouty RN, BSN Entered By: Baruch Gouty on 06/12/2021 08:00:35 -------------------------------------------------------------------------------- Fall Risk Assessment Details Patient Name: Date of Service: Michelle TO [name] 06/12/2021 7:30 A M Medical Record Number: [id number] Patient Account Number: [id number] Date of Birth/Sex: Treating RN: September 29, 1929 (87 y.o. Female) Baruch Gouty Primary Care Bronwyn Belasco:  Maury Dus Other Clinician: Referring Skylynn Burkley: Treating Takiya Belmares/Extender: Yaakov Guthrie in Treatment: 0 Fall Risk Assessment Items Have you had 2 or more falls in the last 12 monthso 0 Yes Have you had any fall that resulted in injury in the last 12 monthso 0 Yes FALLS RISK SCREEN History of falling - immediate or within 3 months 25 Yes Secondary diagnosis (Do you have 2 or more medical diagnoseso) 0 No Ambulatory aid None/bed rest/wheelchair/nurse 0 No Crutches/cane/walker 15 Yes Furniture 0 No Intravenous therapy Access/Saline/Heparin Lock 0 No Gait/Transferring Normal/ bed rest/ wheelchair 0 No Weak (short  steps with or without shuffle, stooped but able to lift head while walking, may seek 10 Yes support from furniture) Impaired (short steps with shuffle, may have difficulty arising from chair, head down, impaired 0 No balance) Mental Status Oriented to own ability 0 Yes Electronic Signature(s) Signed: 06/12/2021 4:07:03 PM By: Baruch Gouty RN, BSN Entered By: Baruch Gouty on 06/12/2021 08:01:00 -------------------------------------------------------------------------------- Foot Assessment Details Patient Name: Date of Service: Michelle TO [name] 06/12/2021 7:30 A M Medical Record Number: [id number] Patient Account Number: [id number] Date of Birth/Sex: Treating RN: 07/17/1930 (87 y.o. Female) Baruch Gouty Primary Care Ivis Henneman: Maury Dus Other Clinician: Referring Hermina Barnard: Treating Laylanie Kruczek/Extender: Yaakov Guthrie in Treatment: 0 Foot Assessment Items Site Locations + = Sensation present, - = Sensation absent, C = Callus, U = Ulcer R = Redness, W = Warmth, M = Maceration, PU = Pre-ulcerative lesion F = Fissure, S = Swelling, D = Dryness Assessment Right: Left: Other Deformity: No No Prior Foot Ulcer: No No Prior Amputation: No No Charcot Joint: No No Ambulatory Status: Ambulatory With Help Assistance Device: Cane Gait:  Steady Electronic Signature(s) Signed: 06/12/2021 4:07:03 PM By: Baruch Gouty RN, BSN Entered By: Baruch Gouty on 06/12/2021 08:01:36 -------------------------------------------------------------------------------- Nutrition Risk Screening Details Patient Name: Date of Service: Michelle TO [name] 06/12/2021 7:30 A M Medical Record Number: [id number] Patient Account Number: [id number] Date of Birth/Sex: Treating RN: 1930-06-02 (87 y.o. Female) Baruch Gouty Primary Care Taja Pentland: Maury Dus Other Clinician: Referring Vandora Jaskulski: Treating Peyten Weare/Extender: Yaakov Guthrie in Treatment: 0 Height (in): 63 Weight (lbs): 145 Body Mass Index (BMI): 25.7 Nutrition Risk Screening Items Score Screening NUTRITION RISK SCREEN: I have an illness or condition that made me change the kind and/or amount of food I eat 0 No I eat fewer than two meals per day 0 No I eat few fruits and vegetables, or milk products 0 No I have three or more drinks of beer, liquor or wine almost every day 0 No I have tooth or mouth problems that make it hard for me to eat 0 No I don't always have enough money to buy the food I need 0 No I eat alone most of the time 1 Yes I take three or more different prescribed or over-the-counter drugs a day 1 Yes Without wanting to, I have lost or gained 10 pounds in the last six months 0 No I am not always physically able to shop, cook and/or feed myself 0 No Nutrition Protocols Good Risk Protocol 0 No interventions needed Moderate Risk Protocol High Risk Proctocol Risk Level: Good Risk Score: 2 Electronic Signature(s) Signed: 06/12/2021 4:07:03 PM By: Baruch Gouty RN, BSN Entered By: Baruch Gouty on 06/12/2021 08:01:26

## 2021-06-12 NOTE — Progress Notes (Signed)
Michelle Jones, Michelle Jones ([id number]) Visit Report for 06/12/2021 Allergy List Details Patient Name: Date of Service: DEA TO Michelle Jones 06/12/2021 7:30 A M Medical Record Number: [id number] Patient Account Number: [id number] Date of Birth/Sex: Treating RN: November 27, 1929 (87 y.o. Female) Baruch Gouty Primary Care Rochanda Harpham: Maury Dus Other Clinician: Referring Leba Tibbitts: Treating Hind Chesler/Extender: Yaakov Guthrie in Treatment: 0 Allergies Active Allergies penicillin Reaction: rash in mouth Allergy Notes Electronic Signature(s) Signed: 06/12/2021 4:07:03 PM By: Baruch Gouty RN, BSN Entered By: Baruch Gouty on 06/12/2021 07:49:11 -------------------------------------------------------------------------------- Arrival Information Details Patient Name: Date of Service: DEA TO Michelle Jones. 06/12/2021 7:30 A M Medical Record Number: [id number] Patient Account Number: [id number] Date of Birth/Sex: Treating RN: July 01, 1930 (87 y.o. Female) Baruch Gouty Primary Care Moni Rothrock: Maury Dus Other Clinician: Referring Mycal Conde: Treating Gerrica Cygan/Extender: Yaakov Guthrie in Treatment: 0 Visit Information Patient Arrived: Michelle Jones Arrival Time: 07:39 Accompanied By: son Transfer Assistance: None Patient Identification Verified: Yes Secondary Verification Process Completed: Yes Patient Requires Transmission-Based Precautions: No Patient Has Alerts: No Electronic Signature(s) Signed: 06/12/2021 4:07:03 PM By: Baruch Gouty RN, BSN Entered By: Baruch Gouty on 06/12/2021 07:48:07 -------------------------------------------------------------------------------- Clinic Level of Care Assessment Details Patient Name: Date of Service: DEA TO Michelle Jones. 06/12/2021 7:30 A M Medical Record Number: [id number] Patient Account Number: [id number] Date of Birth/Sex: Treating RN: [birth year] (87 y.o. Female) Baruch Gouty Primary Care Jair Lindblad: Maury Dus Other Clinician: Referring  Roshawn Ayala: Treating Tyrena Gohr/Extender: Yaakov Guthrie in Treatment: 0 Clinic Level of Care Assessment Items TOOL 2 Quantity Score []  - 0 Use when only an EandM is performed on the INITIAL visit ASSESSMENTS - Nursing Assessment / Reassessment X- 1 20 General Physical Exam (combine w/ comprehensive assessment (listed just below) when performed on new pt. evals) X- 1 25 Comprehensive Assessment (HX, ROS, Risk Assessments, Wounds Hx, etc.) ASSESSMENTS - Wound and Skin A ssessment / Reassessment X - Simple Wound Assessment / Reassessment - one wound 1 5 []  - 0 Complex Wound Assessment / Reassessment - multiple wounds []  - 0 Dermatologic / Skin Assessment (not related to wound area) ASSESSMENTS - Ostomy and/or Continence Assessment and Care []  - 0 Incontinence Assessment and Management []  - 0 Ostomy Care Assessment and Management (repouching, etc.) PROCESS - Coordination of Care X - Simple Patient / Family Education for ongoing care 1 15 []  - 0 Complex (extensive) Patient / Family Education for ongoing care X- 1 10 Staff obtains Programmer, systems, Records, T Results / Process Orders est []  - 0 Staff telephones HHA, Nursing Homes / Clarify orders / etc []  - 0 Routine Transfer to another Facility (non-emergent condition) []  - 0 Routine Hospital Admission (non-emergent condition) X- 1 15 New Admissions / Biomedical engineer / Ordering NPWT Apligraf, etc. , []  - 0 Emergency Hospital Admission (emergent condition) X- 1 10 Simple Discharge Coordination []  - 0 Complex (extensive) Discharge Coordination PROCESS - Special Needs []  - 0 Pediatric / Minor Patient Management []  - 0 Isolation Patient Management []  - 0 Hearing / Language / Visual special needs []  - 0 Assessment of Community assistance (transportation, D/C planning, etc.) []  - 0 Additional assistance / Altered mentation []  - 0 Support Surface(s) Assessment (bed, cushion, seat, etc.) INTERVENTIONS - Wound  Cleansing / Measurement X- 1 5 Wound Imaging (photographs - any number of wounds) []  - 0 Wound Tracing (instead of photographs) X- 1 5 Simple Wound Measurement - one wound []  - 0 Complex Wound Measurement - multiple wounds X- 1 5 Simple Wound Cleansing - one wound []  -  0 Complex Wound Cleansing - multiple wounds INTERVENTIONS - Wound Dressings X - Small Wound Dressing one or multiple wounds 1 10 []  - 0 Medium Wound Dressing one or multiple wounds []  - 0 Large Wound Dressing one or multiple wounds []  - 0 Application of Medications - injection INTERVENTIONS - Miscellaneous []  - 0 External ear exam []  - 0 Specimen Collection (cultures, biopsies, blood, body fluids, etc.) []  - 0 Specimen(s) / Culture(s) sent or taken to Lab for analysis []  - 0 Patient Transfer (multiple staff / Harrel Lemon Lift / Similar devices) []  - 0 Simple Staple / Suture removal (25 or less) []  - 0 Complex Staple / Suture removal (26 or more) []  - 0 Hypo / Hyperglycemic Management (close monitor of Blood Glucose) []  - 0 Ankle / Brachial Index (ABI) - do not check if billed separately Has the patient been seen at the hospital within the last three years: Yes Total Score: 125 Level Of Care: New/Established - Level 4 Electronic Signature(s) Signed: 06/12/2021 4:07:03 PM By: Baruch Gouty RN, BSN Entered By: Baruch Gouty on 06/12/2021 08:27:23 -------------------------------------------------------------------------------- Encounter Discharge Information Details Patient Name: Date of Service: DEA TO Michelle Jones. 06/12/2021 7:30 A M Medical Record Number: [id number] Patient Account Number: [id number] Date of Birth/Sex: Treating RN: 1929/11/29 (87 y.o. Female) Baruch Gouty Primary Care Brentley Landfair: Maury Dus Other Clinician: Referring Kourtland Coopman: Treating Jashanti Clinkscale/Extender: Yaakov Guthrie in Treatment: 0 Encounter Discharge Information Items Post Procedure Vitals Discharge Condition:  Stable Temperature (F): 97.5 Ambulatory Status: Cane Pulse (bpm): 64 Discharge Destination: Home Respiratory Rate (breaths/min): 18 Transportation: Private Auto Blood Pressure (mmHg): 101/62 Accompanied By: son Schedule Follow-up Appointment: Yes Clinical Summary of Care: Patient Declined Electronic Signature(s) Signed: 06/12/2021 4:07:03 PM By: Baruch Gouty RN, BSN Entered By: Baruch Gouty on 06/12/2021 08:46:38 -------------------------------------------------------------------------------- Lower Extremity Assessment Details Patient Name: Date of Service: DEA TO Michelle Jones. 06/12/2021 7:30 A M Medical Record Number: [id number] Patient Account Number: [id number] Date of Birth/Sex: Treating RN: 1930-04-16 (87 y.o. Female) Baruch Gouty Primary Care Pattrick Bady: Maury Dus Other Clinician: Referring Tniya Bowditch: Treating Naela Nodal/Extender: Yaakov Guthrie in Treatment: 0 Electronic Signature(s) Signed: 06/12/2021 4:07:03 PM By: Baruch Gouty RN, BSN Entered By: Baruch Gouty on 06/12/2021 08:01:45 -------------------------------------------------------------------------------- Multi Wound Chart Details Patient Name: Date of Service: DEA TO Michelle Jones. 06/12/2021 7:30 A M Medical Record Number: [id number] Patient Account Number: [id number] Date of Birth/Sex: Treating RN: 11-20-1929 (87 y.o. Female) Baruch Gouty Primary Care Torris House: Maury Dus Other Clinician: Referring Cherron Blitzer: Treating Fumiko Cham/Extender: Yaakov Guthrie in Treatment: 0 Vital Signs Height(in): 63 Pulse(bpm): 70 Weight(lbs): 145 Blood Pressure(mmHg): 101/62 Body Mass Index(BMI): 26 Temperature(F): 97.5 Respiratory Rate(breaths/min): 18 Photos: [N/A:N/A] Right Forearm N/A N/A Wound Location: Trauma N/A N/A Wounding Event: Skin T ear N/A N/A Primary Etiology: Cataracts, Hypertension, N/A N/A Comorbid History: Osteoarthritis 06/08/2021 N/A N/A Date Acquired: 0 N/A  N/A Weeks of Treatment: Open N/A N/A Wound Status: 5x4.5x0.1 N/A N/A Measurements L x W x D (cm) 17.671 N/A N/A A (cm) : rea 1.767 N/A N/A Volume (cm) : 0.00% N/A N/A % Reduction in A rea: 0.00% N/A N/A % Reduction in Volume: Full Thickness Without Exposed N/A N/A Classification: Support Structures Medium N/A N/A Exudate A mount: Serosanguineous N/A N/A Exudate Type: red, brown N/A N/A Exudate Color: Flat and Intact N/A N/A Wound Margin: Medium (34-66%) N/A N/A Granulation A mount: Pink N/A N/A Granulation Quality: Medium (34-66%) N/A N/A Necrotic A mount: Fat Layer (Subcutaneous Tissue): Yes N/A N/A Exposed Structures: Fascia: No  Tendon: No Muscle: No Joint: No Bone: No Small (1-33%) N/A N/A Epithelialization: Chemical/Enzymatic/Mechanical N/A N/A Debridement: N/A N/A N/A Instrument: None N/A N/A Bleeding: Debridement Treatment Response: Procedure was tolerated well N/A N/A Post Debridement Measurements L x 5x4.5x0.1 N/A N/A W x D (cm) 1.767 N/A N/A Post Debridement Volume: (cm) Debridement N/A N/A Procedures Performed: Treatment Notes Electronic Signature(s) Signed: 06/12/2021 8:50:29 AM By: Kalman Shan DO Signed: 06/12/2021 4:07:03 PM By: Baruch Gouty RN, BSN Entered By: Kalman Shan on 06/12/2021 08:41:17 -------------------------------------------------------------------------------- Multi-Disciplinary Care Plan Details Patient Name: Date of Service: DEA TO Michelle Jones. 06/12/2021 7:30 A M Medical Record Number: [id number] Patient Account Number: [id number] Date of Birth/Sex: Treating RN: 15-Nov-1929 (87 y.o. Female) Baruch Gouty Primary Care Chauna Osoria: Maury Dus Other Clinician: Referring Perl Folmar: Treating Sharonne Ricketts/Extender: Yaakov Guthrie in Treatment: 0 Multidisciplinary Care Plan reviewed with physician Active Inactive Abuse / Safety / Falls / Self Care Management Nursing Diagnoses: History of Falls Impaired  physical mobility Potential for falls Goals: Patient will remain injury free related to falls Date Initiated: 06/12/2021 Target Resolution Date: 07/10/2021 Goal Status: Active Patient/caregiver will verbalize/demonstrate measures taken to prevent injury and/or falls Date Initiated: 06/12/2021 Target Resolution Date: 07/10/2021 Goal Status: Active Interventions: Assess fall risk on admission and as needed Assess impairment of mobility on admission and as needed per policy Notes: Wound/Skin Impairment Nursing Diagnoses: Impaired tissue integrity Knowledge deficit related to ulceration/compromised skin integrity Goals: Patient/caregiver will verbalize understanding of skin care regimen Date Initiated: 06/12/2021 Target Resolution Date: 07/10/2021 Goal Status: Active Ulcer/skin breakdown will have a volume reduction of 30% by week 4 Date Initiated: 06/12/2021 Target Resolution Date: 07/10/2021 Goal Status: Active Interventions: Assess patient/caregiver ability to obtain necessary supplies Assess patient/caregiver ability to perform ulcer/skin care regimen upon admission and as needed Assess ulceration(s) every visit Provide education on ulcer and skin care Treatment Activities: Skin care regimen initiated : 06/12/2021 Topical wound management initiated : 06/12/2021 Notes: Electronic Signature(s) Signed: 06/12/2021 4:07:03 PM By: Baruch Gouty RN, BSN Entered By: Baruch Gouty on 06/12/2021 08:25:02 -------------------------------------------------------------------------------- Pain Assessment Details Patient Name: Date of Service: DEA TO Michelle Jones. 06/12/2021 7:30 A M Medical Record Number: [id number] Patient Account Number: [id number] Date of Birth/Sex: Treating RN: [birth year] (87 y.o. Female) Baruch Gouty Primary Care Shamarie Call: Maury Dus Other Clinician: Referring Damione Robideau: Treating Devarion Mcclanahan/Extender: Yaakov Guthrie in Treatment: 0 Active  Problems Location of Pain Severity and Description of Pain Patient Has Paino Yes Site Locations Pain Location: Pain in Ulcers With Dressing Change: Yes Duration of the Pain. Constant / Intermittento Intermittent Rate the pain. Current Pain Level: 6 Worst Pain Level: 7 Least Pain Level: 0 Character of Pain Describe the Pain: Other: stinging Pain Management and Medication Current Pain Management: Medication: Yes Is the Current Pain Management Adequate: Adequate How does your wound impact your activities of daily livingo Sleep: No Bathing: No Appetite: No Relationship With Others: No Bladder Continence: No Emotions: No Bowel Continence: No Work: No Toileting: No Drive: No Dressing: No Hobbies: No Electronic Signature(s) Signed: 06/12/2021 4:07:03 PM By: Baruch Gouty RN, BSN Entered By: Baruch Gouty on 06/12/2021 08:11:40 -------------------------------------------------------------------------------- Patient/Caregiver Education Details Patient Name: Date of Service: DEA TO Michelle Jones 11/1/2022andnbsp7:30 A M Medical Record Number: [id number] Patient Account Number: [id number] Date of Birth/Gender: Treating RN: 09/23/1929 (87 y.o. Female) Baruch Gouty Primary Care Physician: Maury Dus Other Clinician: Referring Physician: Treating Physician/Extender: Yaakov Guthrie in Treatment: 0 Education Assessment Education Provided To: Patient Education Topics Provided Safety: Handouts: Personal Safety Methods:  Explain/Verbal Responses: Reinforcements needed, State content correctly [place]: o Handouts: Welcome T [facility] [place] o Methods: Explain/Verbal, Printed Responses: Reinforcements needed, State content correctly Wound/Skin Impairment: Handouts: Caring for Your Ulcer, Skin Care Do's and Dont's Methods: Explain/Verbal, Printed Responses: Reinforcements needed, State content correctly Electronic  Signature(s) Signed: 06/12/2021 4:07:03 PM By: Baruch Gouty RN, BSN Entered By: Baruch Gouty on 06/12/2021 09:32:35 -------------------------------------------------------------------------------- Wound Assessment Details Patient Name: Date of Service: DEA TO Michelle Jones. 06/12/2021 7:30 A M Medical Record Number: [id number] Patient Account Number: [id number] Date of Birth/Sex: Treating RN: 1929-10-06 (87 y.o. Female) Baruch Gouty Primary Care Abcde Oneil: Maury Dus Other Clinician: Referring Morenike Cuff: Treating Diandra Cimini/Extender: Yaakov Guthrie in Treatment: 0 Wound Status Wound Number: 1 Primary Etiology: Skin Tear Wound Location: Right Forearm Wound Status: Open Wounding Event: Trauma Comorbid History: Cataracts, Hypertension, Osteoarthritis Date Acquired: 06/08/2021 Weeks Of Treatment: 0 Clustered Wound: No Photos Wound Measurements Length: (cm) 5 Width: (cm) 4.5 Depth: (cm) 0.1 Area: (cm) 17.671 Volume: (cm) 1.767 % Reduction in Area: 0% % Reduction in Volume: 0% Epithelialization: Small (1-33%) Tunneling: No Undermining: No Wound Description Classification: Full Thickness Without Exposed Support Structures Wound Margin: Flat and Intact Exudate Amount: Medium Exudate Type: Serosanguineous Exudate Color: red, brown Foul Odor After Cleansing: No Slough/Fibrino Yes Wound Bed Granulation Amount: Medium (34-66%) Exposed Structure Granulation Quality: Pink Fascia Exposed: No Necrotic Amount: Medium (34-66%) Fat Layer (Subcutaneous Tissue) Exposed: Yes Necrotic Quality: Adherent Slough Tendon Exposed: No Muscle Exposed: No Joint Exposed: No Bone Exposed: No Treatment Notes Wound #1 (Forearm) Wound Laterality: Right Cleanser Normal Saline Discharge Instruction: Cleanse the wound with Normal Saline prior to applying a clean dressing using gauze sponges, not tissue or cotton balls. Soap and Water Discharge Instruction: May shower and wash wound  with dial antibacterial soap and water prior to dressing change. Peri-Wound Care Topical Primary Dressing FIBRACOL Plus Dressing, 2x2 in (collagen) Discharge Instruction: Moisten collagen with saline or hydrogel Secondary Dressing T Non-Adherent Dressing, 3x4 in elfa Discharge Instruction: Apply over primary dressing as directed. Secured With Conforming Stretch Gauze Bandage, Sterile 2x75 (in/in) Discharge Instruction: Secure with stretch gauze as directed. Paper Tape, 2x10 (in/yd) Discharge Instruction: Secure dressing with tape as directed. Compression Wrap Compression Stockings Add-Ons Electronic Signature(s) Signed: 06/12/2021 4:07:03 PM By: Baruch Gouty RN, BSN Entered By: Baruch Gouty on 06/12/2021 08:09:18 -------------------------------------------------------------------------------- Vitals Details Patient Name: Date of Service: DEA TO Michelle Jones. 06/12/2021 7:30 A M Medical Record Number: [id number] Patient Account Number: [id number] Date of Birth/Sex: Treating RN: 06-23-1930 (87 y.o. Female) Baruch Gouty Primary Care Kathreen Dileo: Maury Dus Other Clinician: Referring Paschal Blanton: Treating Anarie Kalish/Extender: Yaakov Guthrie in Treatment: 0 Vital Signs Time Taken: 07:48 Temperature (F): 97.5 Height (in): 63 Pulse (bpm): 64 Source: Stated Respiratory Rate (breaths/min): 18 Weight (lbs): 145 Blood Pressure (mmHg): 101/62 Source: Stated Reference Range: 80 - 120 mg / dl Body Mass Index (BMI): 25.7 Electronic Signature(s) Signed: 06/12/2021 4:07:03 PM By: Baruch Gouty RN, BSN Entered By: Baruch Gouty on 06/12/2021 07:48:45

## 2021-06-13 DIAGNOSIS — F05 Delirium due to known physiological condition: Secondary | ICD-10-CM | POA: Diagnosis not present

## 2021-06-13 DIAGNOSIS — N1831 Chronic kidney disease, stage 3a: Secondary | ICD-10-CM | POA: Diagnosis not present

## 2021-06-13 DIAGNOSIS — I129 Hypertensive chronic kidney disease with stage 1 through stage 4 chronic kidney disease, or unspecified chronic kidney disease: Secondary | ICD-10-CM | POA: Diagnosis not present

## 2021-06-13 DIAGNOSIS — F325 Major depressive disorder, single episode, in full remission: Secondary | ICD-10-CM | POA: Diagnosis not present

## 2021-06-13 DIAGNOSIS — E782 Mixed hyperlipidemia: Secondary | ICD-10-CM | POA: Diagnosis not present

## 2021-06-13 DIAGNOSIS — G8929 Other chronic pain: Secondary | ICD-10-CM | POA: Diagnosis not present

## 2021-06-13 DIAGNOSIS — M159 Polyosteoarthritis, unspecified: Secondary | ICD-10-CM | POA: Diagnosis not present

## 2021-06-13 DIAGNOSIS — K219 Gastro-esophageal reflux disease without esophagitis: Secondary | ICD-10-CM | POA: Diagnosis not present

## 2021-06-13 DIAGNOSIS — I10 Essential (primary) hypertension: Secondary | ICD-10-CM | POA: Diagnosis not present

## 2021-06-13 DIAGNOSIS — E039 Hypothyroidism, unspecified: Secondary | ICD-10-CM | POA: Diagnosis not present

## 2021-06-19 ENCOUNTER — Encounter (HOSPITAL_BASED_OUTPATIENT_CLINIC_OR_DEPARTMENT_OTHER): Payer: Medicare Other | Admitting: Internal Medicine

## 2021-06-19 ENCOUNTER — Other Ambulatory Visit: Payer: Self-pay

## 2021-06-19 DIAGNOSIS — S41101D Unspecified open wound of right upper arm, subsequent encounter: Secondary | ICD-10-CM | POA: Diagnosis not present

## 2021-06-19 DIAGNOSIS — S41101A Unspecified open wound of right upper arm, initial encounter: Secondary | ICD-10-CM | POA: Diagnosis not present

## 2021-06-19 DIAGNOSIS — I10 Essential (primary) hypertension: Secondary | ICD-10-CM | POA: Diagnosis not present

## 2021-06-19 DIAGNOSIS — E039 Hypothyroidism, unspecified: Secondary | ICD-10-CM | POA: Diagnosis not present

## 2021-06-20 NOTE — Progress Notes (Signed)
Michelle Jones ([id number]) Visit Report for 06/19/2021 Chief Complaint Document Details Patient Name: Date of Service: DEA TO Michelle Jones, Michelle Jones 06/19/2021 9:30 A M Medical Record Number: [id number] Patient Account Number: [id number] Date of Birth/Sex: Treating RN: Dec 23, 1929 (87 y.o. Michelle Jones Primary Care Provider: Maury Jones Other Clinician: Referring Provider: Treating Provider/Extender: Michelle Jones in Treatment: 1 Information Obtained from: Patient Chief Complaint Right arm skin tear Electronic Signature(s) Signed: 06/19/2021 10:16:42 AM By: Michelle Shan DO Entered By: Michelle Jones on 06/19/2021 10:11:43 -------------------------------------------------------------------------------- Debridement Details Patient Name: Date of Service: DEA TO Michelle Jones. 06/19/2021 9:30 A M Medical Record Number: [id number] Patient Account Number: [id number] Date of Birth/Sex: Treating RN: 1929-12-08 (87 y.o. Michelle Jones, Meta.Reding Primary Care Provider: Maury Jones Other Clinician: Referring Provider: Treating Provider/Extender: Michelle Jones in Treatment: 1 Debridement Performed for Assessment: Wound #1 Right Forearm Performed By: Physician Michelle Shan, DO Debridement Type: Debridement Level of Consciousness (Pre-procedure): Awake and Alert Pre-procedure Verification/Time Out Yes - 10:00 Taken: Start Time: 10:01 Pain Control: Lidocaine 4% T opical Solution T Area Debrided (L x W): otal 3 (cm) x 3 (cm) = 9 (cm) Tissue and other material debrided: Viable, Non-Viable, Subcutaneous, Skin: Dermis , Skin: Epidermis, Fibrin/Exudate Level: Skin/Subcutaneous Tissue Debridement Description: Excisional Instrument: Curette Bleeding: Minimum Hemostasis Achieved: Pressure End Time: 10:05 Procedural Pain: 1 Post Procedural Pain: 3 Response to Treatment: Procedure was tolerated well Level of Consciousness (Post- Awake and  Alert procedure): Post Debridement Measurements of Total Wound Length: (cm) 3 Width: (cm) 3.5 Depth: (cm) 0.1 Volume: (cm) 0.825 Character of Wound/Ulcer Post Debridement: Improved Post Procedure Diagnosis Same as Pre-procedure Electronic Signature(s) Signed: 06/19/2021 10:16:42 AM By: Michelle Shan DO Signed: 06/20/2021 5:29:58 PM By: Michelle Pilling RN, BSN Entered By: Michelle Jones on 06/19/2021 10:06:31 -------------------------------------------------------------------------------- HPI Details Patient Name: Date of Service: DEA TO Michelle Jones. 06/19/2021 9:30 A M Medical Record Number: [id number] Patient Account Number: [id number] Date of Birth/Sex: Treating RN: 06/24/1930 (87 y.o. Michelle Jones Primary Care Provider: Maury Jones Other Clinician: Referring Provider: Treating Provider/Extender: Michelle Jones in Treatment: 1 History of Present Illness HPI Description: Admission 06/12/2021 [name] is an 87 year old female with a past medical history of essential hypertension and hypothyroidism that presents to the clinic for a right arm skin tear that occurred 4 days ago. She states she was chasing a bug and successfully caught it but developed a skin tear. It is unclear if she fell or not. She has been using antibiotic ointment to the area. She denies signs of infection. 11/8; patient presents for follow-up. She reports mild tenderness to the wound bed. She has no issues with the collagen dressings. She denies signs of infection. Electronic Signature(s) Signed: 06/19/2021 10:16:42 AM By: Michelle Shan DO Entered By: Michelle Jones on 06/19/2021 10:12:25 -------------------------------------------------------------------------------- Physical Exam Details Patient Name: Date of Service: DEA TO Michelle Jones. 06/19/2021 9:30 A M Medical Record Number: [id number] Patient Account Number: [id number] Date of Birth/Sex: Treating RN: 12-18-1929 (87  y.o. Michelle Jones Primary Care Provider: Maury Jones Other Clinician: Referring Provider: Treating Provider/Extender: Michelle Jones in Treatment: 1 Constitutional respirations regular, non-labored and within target range for patient.Marland Kitchen Psychiatric pleasant and cooperative. Notes Right forearm: Skin tear with granulation tissue and non viable tissue present. There is overlying intact epidermis however those edges are not completely adhered. Previous blackish blue hue has resolved except for at the nonadherent edges. Granulation tissue present. No  signs of infection. No drainage noted. Electronic Signature(s) Signed: 06/19/2021 10:16:42 AM By: Michelle Shan DO Entered By: Michelle Jones on 06/19/2021 10:14:43 -------------------------------------------------------------------------------- Physician Orders Details Patient Name: Date of Service: DEA TO Michelle Jones. 06/19/2021 9:30 A M Medical Record Number: [id number] Patient Account Number: [id number] Date of Birth/Sex: Treating RN: 1929/11/06 (87 y.o. Michelle Jones Primary Care Provider: Maury Jones Other Clinician: Referring Provider: Treating Provider/Extender: Michelle Jones in Treatment: 1 Verbal / Phone Orders: No Diagnosis Coding ICD-10 Coding Code Description S41.101D Unspecified open wound of right upper arm, subsequent encounter E03.9 Hypothyroidism, unspecified I10 Essential (primary) hypertension Follow-up Appointments ppointment in 1 week. - Dr. Heber Jones Return A Bathing/ Shower/ Hygiene May shower and wash wound with soap and water. Wound Treatment Wound #1 - Forearm Wound Laterality: Right Cleanser: Normal Saline (Generic) Every Other Day/15 Days Discharge Instructions: Cleanse the wound with Normal Saline prior to applying a clean dressing using gauze sponges, not tissue or cotton balls. Cleanser: Soap and Water Every Other Day/15 Days Discharge  Instructions: May shower and wash wound with dial antibacterial soap and water prior to dressing change. Prim Dressing: FIBRACOL Plus Dressing, 2x2 in (collagen) (Dispense As Written) Every Other Day/15 Days ary Discharge Instructions: Moisten collagen with use [place] Jelly Secondary Dressing: T Non-Adherent Dressing, 3x4 in (Generic) Every Other Day/15 Days elfa Discharge Instructions: Apply over primary dressing as directed. Secured With: Child psychotherapist, Sterile 2x75 (in/in) (Generic) Every Other Day/15 Days Discharge Instructions: Secure with stretch gauze as directed. Secured With: Paper Tape, 2x10 (in/yd) (Generic) Every Other Day/15 Days Discharge Instructions: Secure dressing with tape as directed. Electronic Signature(s) Signed: 06/19/2021 10:16:42 AM By: Michelle Shan DO Entered By: Michelle Jones on 06/19/2021 10:15:11 -------------------------------------------------------------------------------- Problem List Details Patient Name: Date of Service: DEA TO Michelle Jones. 06/19/2021 9:30 A M Medical Record Number: [id number] Patient Account Number: [id number] Date of Birth/Sex: Treating RN: 1930/03/23 (87 y.o. Michelle Jones Primary Care Provider: Maury Jones Other Clinician: Referring Provider: Treating Provider/Extender: Michelle Jones in Treatment: 1 Active Problems ICD-10 Encounter Code Description Active Date MDM Diagnosis S41.101D Unspecified open wound of right upper arm, subsequent encounter 06/12/2021 No Yes E03.9 Hypothyroidism, unspecified 06/12/2021 No Yes I10 Essential (primary) hypertension 06/12/2021 No Yes Inactive Problems Resolved Problems Electronic Signature(s) Signed: 06/19/2021 10:16:42 AM By: Michelle Shan DO Entered By: Michelle Jones on 06/19/2021 10:08:42 -------------------------------------------------------------------------------- Progress Note Details Patient Name: Date of Service: DEA TO Michelle Jones. 06/19/2021 9:30 A M Medical Record Number: [id number] Patient Account Number: [id number] Date of Birth/Sex: Treating RN: [birth year] (87 y.o. Michelle Jones Primary Care Provider: Maury Jones Other Clinician: Referring Provider: Treating Provider/Extender: Michelle Jones in Treatment: 1 Subjective Chief Complaint Information obtained from Patient Right arm skin tear History of Present Illness (HPI) Admission 06/12/2021 [name] is an 87 year old female with a past medical history of essential hypertension and hypothyroidism that presents to the clinic for a right arm skin tear that occurred 4 days ago. She states she was chasing a bug and successfully caught it but developed a skin tear. It is unclear if she fell or not. She has been using antibiotic ointment to the area. She denies signs of infection. 11/8; patient presents for follow-up. She reports mild tenderness to the wound bed. She has no issues with the collagen dressings. She denies signs of infection. Patient History Information obtained from Patient, Caregiver, Chart. Family History Cancer - Mother,Siblings, Diabetes - Siblings, Heart  Disease - Father, Hypertension - Mother, Kidney Disease - Siblings, No family history of Hereditary Spherocytosis, Lung Disease, Seizures, Stroke, Thyroid Problems, Tuberculosis. Social History Never smoker, Marital Status - Widowed, Alcohol Use - Never, Drug Use - No History, Caffeine Use - Moderate. Medical History Eyes Patient has history of Cataracts - bil removed Denies history of Glaucoma, Optic Neuritis Cardiovascular Patient has history of Hypertension Endocrine Denies history of Type I Diabetes, Type II Diabetes Genitourinary Denies history of End Stage Renal Disease Integumentary (Skin) Denies history of History of Burn Musculoskeletal Patient has history of Osteoarthritis Denies history of Gout, Rheumatoid Arthritis,  Osteomyelitis Oncologic Denies history of Received Chemotherapy, Received Radiation Psychiatric Denies history of Anorexia/bulimia, Confinement Anxiety Hospitalization/Surgery History - ERCP. - sphinterotomy. - pancreatic stent placement. - removal of gallstone. - carpel tunnel release right. - appendectomy. - tonsilectomy. - vaginal hysterectomy. Medical A Surgical History Notes nd Gastrointestinal gallstones Endocrine hypothyroidism Genitourinary right ureteral stone, CKD Musculoskeletal chronic back pain Psychiatric depression Objective Constitutional respirations regular, non-labored and within target range for patient.. Vitals Time Taken: 9:35 AM, Height: 63 in, Weight: 145 lbs, BMI: 25.7, Temperature: 98.2 F, Pulse: 70 bpm, Respiratory Rate: 6 breaths/min, Blood Pressure: 116/54 mmHg. Psychiatric pleasant and cooperative. General Notes: Right forearm: Skin tear with granulation tissue and non viable tissue present. There is overlying intact epidermis however those edges are not completely adhered. Previous blackish blue hue has resolved except for at the nonadherent edges. Granulation tissue present. No signs of infection. No drainage noted. Integumentary (Hair, Skin) Wound #1 status is Open. Original cause of wound was Trauma. The date acquired was: 06/08/2021. The wound has been in treatment 1 weeks. The wound is located on the Right Forearm. The wound measures 3cm length x 3.5cm width x 0.1cm depth; 8.247cm^2 area and 0.825cm^3 volume. There is Fat Layer (Subcutaneous Tissue) exposed. There is no tunneling or undermining noted. There is a medium amount of serosanguineous drainage noted. The wound margin is flat and intact. There is large (67-100%) pink, friable granulation within the wound bed. There is a small (1-33%) amount of necrotic tissue within the wound bed including Adherent Slough. Assessment Active Problems ICD-10 Unspecified open wound of right upper arm,  subsequent encounter Hypothyroidism, unspecified Essential (primary) hypertension Patient's wound has shown improvement in size and appearance since last clinic visit. I debrided nonviable tissue. No signs of infection on exam. The nonadhered edges of the epithelium will likely need to be removed full continue to watch at this time. I recommended continuing collagen with dressing changes. Follow-up in 1 week. Procedures Wound #1 Pre-procedure diagnosis of Wound #1 is a Skin T located on the Right Forearm . There was a Excisional Skin/Subcutaneous Tissue Debridement with a total ear area of 9 sq cm performed by Michelle Shan, DO. With the following instrument(s): Curette to remove Viable and Non-Viable tissue/material. Material removed includes Subcutaneous Tissue, Skin: Dermis, Skin: Epidermis, and Fibrin/Exudate after achieving pain control using Lidocaine 4% T opical Solution. A time out was conducted at 10:00, prior to the start of the procedure. A Minimum amount of bleeding was controlled with Pressure. The procedure was tolerated well with a pain level of 1 throughout and a pain level of 3 following the procedure. Post Debridement Measurements: 3cm length x 3.5cm width x 0.1cm depth; 0.825cm^3 volume. Character of Wound/Ulcer Post Debridement is improved. Post procedure Diagnosis Wound #1: Same as Pre-Procedure Plan Follow-up Appointments: Return Appointment in 1 week. - Dr. Heber [place] Bathing/ Shower/ Hygiene: May shower and wash  wound with soap and water. WOUND #1: - Forearm Wound Laterality: Right Cleanser: Normal Saline (Generic) Every Other Day/15 Days Discharge Instructions: Cleanse the wound with Normal Saline prior to applying a clean dressing using gauze sponges, not tissue or cotton balls. Cleanser: Soap and Water Every Other Day/15 Days Discharge Instructions: May shower and wash wound with dial antibacterial soap and water prior to dressing change. Prim Dressing: FIBRACOL  Plus Dressing, 2x2 in (collagen) (Dispense As Written) Every Other Day/15 Days ary Discharge Instructions: Moisten collagen with use [place] Jelly Secondary Dressing: T Non-Adherent Dressing, 3x4 in (Generic) Every Other Day/15 Days elfa Discharge Instructions: Apply over primary dressing as directed. Secured With: Child psychotherapist, Sterile 2x75 (in/in) (Generic) Every Other Day/15 Days Discharge Instructions: Secure with stretch gauze as directed. Secured With: Paper Tape, 2x10 (in/yd) (Generic) Every Other Day/15 Days Discharge Instructions: Secure dressing with tape as directed. 1. In office sharp debridement 2. Collagen 3. Follow-up in 1 week Electronic Signature(s) Signed: 06/19/2021 10:16:42 AM By: Michelle Shan DO Entered By: Michelle Jones on 06/19/2021 10:16:09 -------------------------------------------------------------------------------- HxROS Details Patient Name: Date of Service: DEA TO Michelle Jones. 06/19/2021 9:30 A M Medical Record Number: [id number] Patient Account Number: [id number] Date of Birth/Sex: Treating RN: 1929-09-16 (87 y.o. Michelle Jones Primary Care Provider: Maury Jones Other Clinician: Referring Provider: Treating Provider/Extender: Michelle Jones in Treatment: 1 Information Obtained From Patient Caregiver Chart Eyes Medical History: Positive for: Cataracts - bil removed Negative for: Glaucoma; Optic Neuritis Cardiovascular Medical History: Positive for: Hypertension Gastrointestinal Medical History: Past Medical History Notes: gallstones Endocrine Medical History: Negative for: Type I Diabetes; Type II Diabetes Past Medical History Notes: hypothyroidism Genitourinary Medical History: Negative for: End Stage Renal Disease Past Medical History Notes: right ureteral stone, CKD Integumentary (Skin) Medical History: Negative for: History of Burn Musculoskeletal Medical History: Positive for:  Osteoarthritis Negative for: Gout; Rheumatoid Arthritis; Osteomyelitis Past Medical History Notes: chronic back pain Oncologic Medical History: Negative for: Received Chemotherapy; Received Radiation Psychiatric Medical History: Negative for: Anorexia/bulimia; Confinement Anxiety Past Medical History Notes: depression HBO Extended History Items Eyes: Cataracts Immunizations Pneumococcal Vaccine: Received Pneumococcal Vaccination: No Implantable Devices No devices added Hospitalization / Surgery History Type of Hospitalization/Surgery ERCP sphinterotomy pancreatic stent placement removal of gallstone carpel tunnel release right appendectomy tonsilectomy vaginal hysterectomy Family and Social History Cancer: Yes - Mother,Siblings; Diabetes: Yes - Siblings; Heart Disease: Yes - Father; Hereditary Spherocytosis: No; Hypertension: Yes - Mother; Kidney Disease: Yes - Siblings; Lung Disease: No; Seizures: No; Stroke: No; Thyroid Problems: No; Tuberculosis: No; Never smoker; Marital Status - Widowed; Alcohol Use: Never; Drug Use: No History; Caffeine Use: Moderate; Financial Concerns: No; Food, Clothing or Shelter Needs: No; Support System Lacking: No; Transportation Concerns: No Electronic Signature(s) Signed: 06/19/2021 10:16:42 AM By: Michelle Shan DO Signed: 06/20/2021 5:29:58 PM By: Michelle Pilling RN, BSN Entered By: Michelle Jones on 06/19/2021 10:12:34 -------------------------------------------------------------------------------- SuperBill Details Patient Name: Date of Service: DEA TO Michelle Jones 06/19/2021 Medical Record Number: [id number] Patient Account Number: [id number] Date of Birth/Sex: Treating RN: 1930-04-19 (87 y.o. Michelle Jones Primary Care Provider: Maury Jones Other Clinician: Referring Provider: Treating Provider/Extender: Michelle Jones in Treatment: 1 Diagnosis Coding ICD-10 Codes Code Description S41.101D  Unspecified open wound of right upper arm, subsequent encounter E03.9 Hypothyroidism, unspecified I10 Essential (primary) hypertension Facility Procedures CPT4 Code: [id number] Description: 04888 - DEB SUBQ TISSUE 20 SQ CM/< ICD-10 Diagnosis Description S41.101D Unspecified open wound of right upper arm, subsequent encounter Modifier:  Quantity: 1 Physician Procedures : CPT4 Code Description Modifier [id number] 11042 - WC PHYS SUBQ TISS 20 SQ CM ICD-10 Diagnosis Description S41.101D Unspecified open wound of right upper arm, subsequent encounter Quantity: 1 Electronic Signature(s) Signed: 06/19/2021 10:16:42 AM By: Michelle Shan DO Entered By: Michelle Jones on 06/19/2021 10:16:26

## 2021-06-20 NOTE — Progress Notes (Signed)
COURTNAY, PETRILLA ([id number]) Visit Report for 06/19/2021 Arrival Information Details Patient Name: Date of Service: DEA TO VALAREE, FRESQUEZ 06/19/2021 9:30 A M Medical Record Number: [id number] Patient Account Number: [id number] Date of Birth/Sex: Treating RN: June 03, 1930 (87 y.o. Helene Shoe, Meta.Reding Primary Care Lashaya Kienitz: Maury Dus Other Clinician: Referring Georgi Tuel: Treating Pecolia Marando/Extender: Joanne Chars in Treatment: 1 Visit Information History Since Last Visit Added or deleted any medications: No Patient Arrived: [name] Any new allergies or adverse reactions: No Arrival Time: 09:37 Had a fall or experienced change in No Accompanied By: son activities of daily living that may affect Transfer Assistance: None risk of falls: Patient Identification Verified: Yes Signs or symptoms of abuse/neglect since last visito No Secondary Verification Process Completed: Yes Hospitalized since last visit: No Patient Requires Transmission-Based Precautions: No Implantable device outside of the clinic excluding No Patient Has Alerts: No cellular tissue based products placed in the center since last visit: Has Dressing in Place as Prescribed: Yes Pain Present Now: No Electronic Signature(s) Signed: 06/20/2021 5:29:58 PM By: Deon Pilling RN, BSN Entered By: Deon Pilling on 06/19/2021 09:37:25 -------------------------------------------------------------------------------- Encounter Discharge Information Details Patient Name: Date of Service: [name] 06/19/2021 9:30 A M Medical Record Number: [id number] Patient Account Number: [id number] Date of Birth/Sex: Treating RN: September 17, 1929 (87 y.o. Debby Bud Primary Care Jadelin Eng: Maury Dus Other Clinician: Referring Sharan Mcenaney: Treating Elah Avellino/Extender: Joanne Chars in Treatment: 1 Encounter Discharge Information Items Post Procedure Vitals Discharge Condition: Stable Temperature  (F): 98.2 Ambulatory Status: Cane Pulse (bpm): 70 Discharge Destination: Home Respiratory Rate (breaths/min): 20 Transportation: Private Auto Blood Pressure (mmHg): 116/54 Accompanied By: son Schedule Follow-up Appointment: Yes Clinical Summary of Care: Electronic Signature(s) Signed: 06/20/2021 5:29:58 PM By: Deon Pilling RN, BSN Entered By: Deon Pilling on 06/19/2021 10:07:43 -------------------------------------------------------------------------------- Lower Extremity Assessment Details Patient Name: Date of Service: [name] 06/19/2021 9:30 A M Medical Record Number: [id number] Patient Account Number: [id number] Date of Birth/Sex: Treating RN: 03/15/1930 (87 y.o. Debby Bud Primary Care Horacio Werth: Maury Dus Other Clinician: Referring Saloni Lablanc: Treating Zarrah Loveland/Extender: Joanne Chars in Treatment: 1 Electronic Signature(s) Signed: 06/20/2021 5:29:58 PM By: Deon Pilling RN, BSN Entered By: Deon Pilling on 06/19/2021 09:39:04 -------------------------------------------------------------------------------- Multi Wound Chart Details Patient Name: Date of Service: [name] 06/19/2021 9:30 A M Medical Record Number: [id number] Patient Account Number: [id number] Date of Birth/Sex: Treating RN: 1930-07-21 (87 y.o. Debby Bud Primary Care Shulamis Wenberg: Maury Dus Other Clinician: Referring Ventura Leggitt: Treating Sherlonda Flater/Extender: Joanne Chars in Treatment: 1 Vital Signs Height(in): 63 Pulse(bpm): 70 Weight(lbs): 145 Blood Pressure(mmHg): 116/54 Body Mass Index(BMI): 26 Temperature(F): 98.2 Respiratory Rate(breaths/min): 6 Photos: [N/A:N/A] Right Forearm N/A N/A Wound Location: Trauma N/A N/A Wounding Event: Skin Tear N/A N/A Primary Etiology: Cataracts, Hypertension, N/A N/A Comorbid History: Osteoarthritis 06/08/2021 N/A N/A Date Acquired: 1 N/A N/A Weeks of Treatment: Open  N/A N/A Wound Status: 3x3.5x0.1 N/A N/A Measurements L x W x D (cm) 8.247 N/A N/A A (cm) : rea 0.825 N/A N/A Volume (cm) : 53.30% N/A N/A % Reduction in Area: 53.30% N/A N/A % Reduction in Volume: Full Thickness Without Exposed N/A N/A Classification: Support Structures Medium N/A N/A Exudate Amount: Serosanguineous N/A N/A Exudate Type: red, brown N/A N/A Exudate Color: Flat and Intact N/A N/A Wound Margin: Large (67-100%) N/A N/A Granulation Amount: Pink, Friable N/A N/A Granulation Quality: Small (1-33%) N/A N/A Necrotic Amount: Fat Layer (Subcutaneous Tissue): Yes N/A  N/A Exposed Structures: Fascia: No Tendon: No Muscle: No Joint: No Bone: No Small (1-33%) N/A N/A Epithelialization: Debridement - Excisional N/A N/A Debridement: Pre-procedure Verification/Time Out 10:00 N/A N/A Taken: Lidocaine 4% Topical Solution N/A N/A Pain Control: Subcutaneous N/A N/A Tissue Debrided: Skin/Subcutaneous Tissue N/A N/A Level: 9 N/A N/A Debridement A (sq cm): rea Curette N/A N/A Instrument: Minimum N/A N/A Bleeding: Pressure N/A N/A Hemostasis A chieved: 1 N/A N/A Procedural Pain: 3 N/A N/A Post Procedural Pain: Procedure was tolerated well N/A N/A Debridement Treatment Response: 3x3.5x0.1 N/A N/A Post Debridement Measurements L x W x D (cm) 0.825 N/A N/A Post Debridement Volume: (cm) Debridement N/A N/A Procedures Performed: Treatment Notes Wound #1 (Forearm) Wound Laterality: Right Cleanser Normal Saline Discharge Instruction: Cleanse the wound with Normal Saline prior to applying a clean dressing using gauze sponges, not tissue or cotton balls. Soap and Water Discharge Instruction: May shower and wash wound with dial antibacterial soap and water prior to dressing change. Peri-Wound Care Topical Primary Dressing FIBRACOL Plus Dressing, 2x2 in (collagen) Discharge Instruction: Moisten collagen with use [place] Jelly Secondary Dressing T  Non-Adherent Dressing, 3x4 in elfa Discharge Instruction: Apply over primary dressing as directed. Secured With Conforming Stretch Gauze Bandage, Sterile 2x75 (in/in) Discharge Instruction: Secure with stretch gauze as directed. Paper Tape, 2x10 (in/yd) Discharge Instruction: Secure dressing with tape as directed. Compression Wrap Compression Stockings Add-Ons Electronic Signature(s) Signed: 06/19/2021 10:16:42 AM By: Kalman Shan DO Signed: 06/20/2021 5:29:58 PM By: Deon Pilling RN, BSN Entered By: Kalman Shan on 06/19/2021 10:08:51 -------------------------------------------------------------------------------- Multi-Disciplinary Care Plan Details Patient Name: Date of Service: [name] 06/19/2021 9:30 A M Medical Record Number: [id number] Patient Account Number: [id number] Date of Birth/Sex: Treating RN: 04-12-1930 (87 y.o. Helene Shoe, Tammi Klippel Primary Care Merick Kelleher: Maury Dus Other Clinician: Referring Karima Carrell: Treating Jonty Morrical/Extender: Joanne Chars in Treatment: 1 Fox reviewed with physician Active Inactive Abuse / Safety / Falls / Self Care Management Nursing Diagnoses: History of Falls Impaired physical mobility Potential for falls Goals: Patient will remain injury free related to falls Date Initiated: 06/12/2021 Target Resolution Date: 07/10/2021 Goal Status: Active Patient/caregiver will verbalize/demonstrate measures taken to prevent injury and/or falls Date Initiated: 06/12/2021 Target Resolution Date: 07/10/2021 Goal Status: Active Interventions: Assess fall risk on admission and as needed Assess impairment of mobility on admission and as needed per policy Notes: Wound/Skin Impairment Nursing Diagnoses: Impaired tissue integrity Knowledge deficit related to ulceration/compromised skin integrity Goals: Patient/caregiver will verbalize understanding of skin care regimen Date Initiated:  06/12/2021 Target Resolution Date: 07/10/2021 Goal Status: Active Ulcer/skin breakdown will have a volume reduction of 30% by week 4 Date Initiated: 06/12/2021 Target Resolution Date: 07/10/2021 Goal Status: Active Interventions: Assess patient/caregiver ability to obtain necessary supplies Assess patient/caregiver ability to perform ulcer/skin care regimen upon admission and as needed Assess ulceration(s) every visit Provide education on ulcer and skin care Treatment Activities: Skin care regimen initiated : 06/12/2021 Topical wound management initiated : 06/12/2021 Notes: Electronic Signature(s) Signed: 06/20/2021 5:29:58 PM By: Deon Pilling RN, BSN Entered By: Deon Pilling on 06/19/2021 09:46:56 -------------------------------------------------------------------------------- Pain Assessment Details Patient Name: Date of Service: [name] 06/19/2021 9:30 A M Medical Record Number: [id number] Patient Account Number: [id number] Date of Birth/Sex: Treating RN: July 31, 1930 (87 y.o. Debby Bud Primary Care Jehan Ranganathan: Maury Dus Other Clinician: Referring Tobie Hellen: Treating Mirko Tailor/Extender: Joanne Chars in Treatment: 1 Active Problems Location of Pain Severity and Description of Pain Patient Has Paino No  Site Locations Rate the pain. Current Pain Level: 0 Pain Management and Medication Current Pain Management: Medication: No Cold Application: No Rest: No Massage: No Activity: No T.E.N.S.: No Heat Application: No Leg drop or elevation: No Is the Current Pain Management Adequate: Adequate How does your wound impact your activities of daily livingo Sleep: No Bathing: No Appetite: No Relationship With Others: No Bladder Continence: No Emotions: No Bowel Continence: No Work: No Toileting: No Drive: No Dressing: No Hobbies: No Engineer, maintenance) Signed: 06/20/2021 5:29:58 PM By: Deon Pilling RN, BSN Entered By: Deon Pilling on 06/19/2021 09:38:58 -------------------------------------------------------------------------------- Patient/Caregiver Education Details Patient Name: Date of Service: [name] 11/8/2022andnbsp9:30 A M Medical Record Number: [id number] Patient Account Number: [id number] Date of Birth/Gender: Treating RN: 1930-05-02 (87 y.o. Debby Bud Primary Care Physician: Maury Dus Other Clinician: Referring Physician: Treating Physician/Extender: Joanne Chars in Treatment: 1 Education Assessment Education Provided To: Patient Education Topics Provided Wound/Skin Impairment: Handouts: Skin Care Do's and Dont's Methods: Explain/Verbal Responses: Reinforcements needed Electronic Signature(s) Signed: 06/20/2021 5:29:58 PM By: Deon Pilling RN, BSN Entered By: Deon Pilling on 06/19/2021 09:47:15 -------------------------------------------------------------------------------- Wound Assessment Details Patient Name: Date of Service: [name] 06/19/2021 9:30 A M Medical Record Number: [id number] Patient Account Number: [id number] Date of Birth/Sex: Treating RN: July 09, 1930 (87 y.o. Helene Shoe, Meta.Reding Primary Care Janequa Kipnis: Maury Dus Other Clinician: Referring Kellen Dutch: Treating Tris Howell/Extender: Joanne Chars in Treatment: 1 Wound Status Wound Number: 1 Primary Etiology: Skin Tear Wound Location: Right Forearm Wound Status: Open Wounding Event: Trauma Comorbid History: Cataracts, Hypertension, Osteoarthritis Date Acquired: 06/08/2021 Weeks Of Treatment: 1 Clustered Wound: No Photos Wound Measurements Length: (cm) 3 Width: (cm) 3.5 Depth: (cm) 0.1 Area: (cm) 8.247 Volume: (cm) 0.825 % Reduction in Area: 53.3% % Reduction in Volume: 53.3% Epithelialization: Small (1-33%) Tunneling: No Undermining: No Wound Description Classification: Full Thickness Without Exposed Support Structures Wound  Margin: Flat and Intact Exudate Amount: Medium Exudate Type: Serosanguineous Exudate Color: red, brown Foul Odor After Cleansing: No Slough/Fibrino Yes Wound Bed Granulation Amount: Large (67-100%) Exposed Structure Granulation Quality: Pink, Friable Fascia Exposed: No Necrotic Amount: Small (1-33%) Fat Layer (Subcutaneous Tissue) Exposed: Yes Necrotic Quality: Adherent Slough Tendon Exposed: No Muscle Exposed: No Joint Exposed: No Bone Exposed: No Treatment Notes Wound #1 (Forearm) Wound Laterality: Right Cleanser Normal Saline Discharge Instruction: Cleanse the wound with Normal Saline prior to applying a clean dressing using gauze sponges, not tissue or cotton balls. Soap and Water Discharge Instruction: May shower and wash wound with dial antibacterial soap and water prior to dressing change. Peri-Wound Care Topical Primary Dressing FIBRACOL Plus Dressing, 2x2 in (collagen) Discharge Instruction: Moisten collagen with use [place] Jelly Secondary Dressing T Non-Adherent Dressing, 3x4 in elfa Discharge Instruction: Apply over primary dressing as directed. Secured With Conforming Stretch Gauze Bandage, Sterile 2x75 (in/in) Discharge Instruction: Secure with stretch gauze as directed. Paper Tape, 2x10 (in/yd) Discharge Instruction: Secure dressing with tape as directed. Compression Wrap Compression Stockings Add-Ons Electronic Signature(s) Signed: 06/20/2021 5:29:58 PM By: Deon Pilling RN, BSN Entered By: Deon Pilling on 06/19/2021 09:46:13 -------------------------------------------------------------------------------- Vitals Details Patient Name: Date of Service: [name] 06/19/2021 9:30 A M Medical Record Number: [id number] Patient Account Number: [id number] Date of Birth/Sex: Treating RN: February 26, 1930 (87 y.o. Debby Bud Primary Care Kaci Freel: Maury Dus Other Clinician: Referring Maleeha Halls: Treating Krishana Lutze/Extender: Joanne Chars in Treatment: 1 Vital Signs Time Taken: 09:35 Temperature (F): 98.2 Height (in):  63 Pulse (bpm): 70 Weight (lbs): 145 Respiratory Rate (breaths/min): 6 Body Mass Index (BMI): 25.7 Blood Pressure (mmHg): 116/54 Reference Range: 80 - 120 mg / dl Electronic Signature(s) Signed: 06/20/2021 5:29:58 PM By: Deon Pilling RN, BSN Entered By: Deon Pilling on 06/19/2021 09:39:19

## 2021-06-21 DIAGNOSIS — M47816 Spondylosis without myelopathy or radiculopathy, lumbar region: Secondary | ICD-10-CM | POA: Diagnosis not present

## 2021-06-21 DIAGNOSIS — M48062 Spinal stenosis, lumbar region with neurogenic claudication: Secondary | ICD-10-CM | POA: Diagnosis not present

## 2021-06-26 ENCOUNTER — Other Ambulatory Visit: Payer: Self-pay

## 2021-06-26 ENCOUNTER — Encounter (HOSPITAL_BASED_OUTPATIENT_CLINIC_OR_DEPARTMENT_OTHER): Payer: Medicare Other | Admitting: Internal Medicine

## 2021-06-26 DIAGNOSIS — E039 Hypothyroidism, unspecified: Secondary | ICD-10-CM

## 2021-06-26 DIAGNOSIS — I10 Essential (primary) hypertension: Secondary | ICD-10-CM | POA: Diagnosis not present

## 2021-06-26 DIAGNOSIS — S41101A Unspecified open wound of right upper arm, initial encounter: Secondary | ICD-10-CM | POA: Diagnosis not present

## 2021-06-26 DIAGNOSIS — S41101D Unspecified open wound of right upper arm, subsequent encounter: Secondary | ICD-10-CM | POA: Diagnosis not present

## 2021-06-26 NOTE — Progress Notes (Signed)
Michelle Jones, Michelle Jones ([id number]) Visit Report for 06/26/2021 Arrival Information Details Patient Name: Date of Service: Michelle Jones, Michelle Jones 06/26/2021 8:15 A M Medical Record Number: [id number] Patient Account Number: [id number] Date of Birth/Sex: Treating RN: [birth year] (87 y.o. Elam Dutch Primary Care Leighana Neyman: Maury Dus Other Clinician: Referring Aicia Babinski: Treating Dorene Bruni/Extender: Joanne Chars in Treatment: 2 Visit Information History Since Last Visit Added or deleted any medications: No Patient Arrived: [name] Any new allergies or adverse reactions: No Arrival Time: 08:09 Had a fall or experienced change in No Accompanied By: son activities of daily living that may affect Transfer Assistance: None risk of falls: Patient Identification Verified: Yes Signs or symptoms of abuse/neglect since last visito No Secondary Verification Process Completed: Yes Hospitalized since last visit: No Patient Requires Transmission-Based Precautions: No Implantable device outside of the clinic excluding No Patient Has Alerts: No cellular tissue based products placed in the center since last visit: Has Dressing in Place as Prescribed: Yes Pain Present Now: Yes Electronic Signature(s) Signed: 06/26/2021 5:46:53 PM By: Baruch Gouty RN, BSN Entered By: Baruch Gouty on 06/26/2021 08:10:00 -------------------------------------------------------------------------------- Clinic Level of Care Assessment Details Patient Name: Date of Service: Michelle TO JANEIL, SCHEXNAYDER 06/26/2021 8:15 A M Medical Record Number: [id number] Patient Account Number: [id number] Date of Birth/Sex: Treating RN: 30-Jun-1930 (87 y.o. Elam Dutch Primary Care Jaylene Arrowood: Maury Dus Other Clinician: Referring Stephaine Breshears: Treating Macallister Ashmead/Extender: Joanne Chars in Treatment: 2 Clinic Level of Care Assessment Items TOOL 4 Quantity Score []  - 0 Use when only an EandM  is performed on FOLLOW-UP visit ASSESSMENTS - Nursing Assessment / Reassessment X- 1 10 Reassessment of Co-morbidities (includes updates in patient status) X- 1 5 Reassessment of Adherence to Treatment Plan ASSESSMENTS - Wound and Skin A ssessment / Reassessment X - Simple Wound Assessment / Reassessment - one wound 1 5 []  - 0 Complex Wound Assessment / Reassessment - multiple wounds []  - 0 Dermatologic / Skin Assessment (not related to wound area) ASSESSMENTS - Focused Assessment []  - 0 Circumferential Edema Measurements - multi extremities []  - 0 Nutritional Assessment / Counseling / Intervention []  - 0 Lower Extremity Assessment (monofilament, tuning fork, pulses) []  - 0 Peripheral Arterial Disease Assessment (using hand held doppler) ASSESSMENTS - Ostomy and/or Continence Assessment and Care []  - 0 Incontinence Assessment and Management []  - 0 Ostomy Care Assessment and Management (repouching, etc.) PROCESS - Coordination of Care X - Simple Patient / Family Education for ongoing care 1 15 []  - 0 Complex (extensive) Patient / Family Education for ongoing care X- 1 10 Staff obtains Programmer, systems, Records, T Results / Process Orders est []  - 0 Staff telephones HHA, Nursing Homes / Clarify orders / etc []  - 0 Routine Transfer to another Facility (non-emergent condition) []  - 0 Routine Hospital Admission (non-emergent condition) []  - 0 New Admissions / Biomedical engineer / Ordering NPWT Apligraf, etc. , []  - 0 Emergency Hospital Admission (emergent condition) X- 1 10 Simple Discharge Coordination []  - 0 Complex (extensive) Discharge Coordination PROCESS - Special Needs []  - 0 Pediatric / Minor Patient Management []  - 0 Isolation Patient Management []  - 0 Hearing / Language / Visual special needs []  - 0 Assessment of Community assistance (transportation, D/C planning, etc.) []  - 0 Additional assistance / Altered mentation []  - 0 Support Surface(s) Assessment  (bed, cushion, seat, etc.) INTERVENTIONS - Wound Cleansing / Measurement X - Simple Wound Cleansing - one wound 1 5 []  - 0 Complex Wound  Cleansing - multiple wounds X- 1 5 Wound Imaging (photographs - any number of wounds) []  - 0 Wound Tracing (instead of photographs) X- 1 5 Simple Wound Measurement - one wound []  - 0 Complex Wound Measurement - multiple wounds INTERVENTIONS - Wound Dressings X - Small Wound Dressing one or multiple wounds 1 10 []  - 0 Medium Wound Dressing one or multiple wounds []  - 0 Large Wound Dressing one or multiple wounds X- 1 5 Application of Medications - topical []  - 0 Application of Medications - injection INTERVENTIONS - Miscellaneous []  - 0 External ear exam []  - 0 Specimen Collection (cultures, biopsies, blood, body fluids, etc.) []  - 0 Specimen(s) / Culture(s) sent or taken to Lab for analysis []  - 0 Patient Transfer (multiple staff / Civil Service fast streamer / Similar devices) []  - 0 Simple Staple / Suture removal (25 or less) []  - 0 Complex Staple / Suture removal (26 or more) []  - 0 Hypo / Hyperglycemic Management (close monitor of Blood Glucose) []  - 0 Ankle / Brachial Index (ABI) - do not check if billed separately X- 1 5 Vital Signs Has the patient been seen at the hospital within the last three years: Yes Total Score: 90 Level Of Care: New/Established - Level 3 Electronic Signature(s) Signed: 06/26/2021 5:46:53 PM By: Baruch Gouty RN, BSN Entered By: Baruch Gouty on 06/26/2021 08:47:33 -------------------------------------------------------------------------------- Encounter Discharge Information Details Patient Name: Date of Service: Michelle TO [name] 06/26/2021 8:15 A M Medical Record Number: [id number] Patient Account Number: [id number] Date of Birth/Sex: Treating RN: 30-Aug-1929 (87 y.o. Elam Dutch Primary Care Kamica Florance: Maury Dus Other Clinician: Referring Bryceson Grape: Treating Arlee Bossard/Extender: Joanne Chars in Treatment: 2 Encounter Discharge Information Items Discharge Condition: Stable Ambulatory Status: Cane Discharge Destination: Home Transportation: Private Auto Accompanied By: son Schedule Follow-up Appointment: Yes Clinical Summary of Care: Patient Declined Electronic Signature(s) Signed: 06/26/2021 5:46:53 PM By: Baruch Gouty RN, BSN Entered By: Baruch Gouty on 06/26/2021 08:57:19 -------------------------------------------------------------------------------- Lower Extremity Assessment Details Patient Name: Date of Service: Michelle TO [name] 06/26/2021 8:15 A M Medical Record Number: [id number] Patient Account Number: [id number] Date of Birth/Sex: Treating RN: [birth year] (87 y.o. Elam Dutch Primary Care Shanah Guimaraes: Maury Dus Other Clinician: Referring Coretta Leisey: Treating Davine Coba/Extender: Joanne Chars in Treatment: 2 Electronic Signature(s) Signed: 06/26/2021 5:46:53 PM By: Baruch Gouty RN, BSN Entered By: Baruch Gouty on 06/26/2021 08:12:55 -------------------------------------------------------------------------------- Multi Wound Chart Details Patient Name: Date of Service: Michelle TO Tia Masker 06/26/2021 8:15 A M Medical Record Number: [id number] Patient Account Number: [id number] Date of Birth/Sex: Treating RN: 01/09/1930 (87 y.o. Debby Bud Primary Care Latrelle Fuston: Maury Dus Other Clinician: Referring Sherryann Frese: Treating Graves Nipp/Extender: Joanne Chars in Treatment: 2 Vital Signs Height(in): 63 Pulse(bpm): 81 Weight(lbs): 145 Blood Pressure(mmHg): 127/69 Body Mass Index(BMI): 26 Temperature(F): 98 Respiratory Rate(breaths/min): 18 Photos: [N/A:N/A] Right Forearm N/A N/A Wound Location: Trauma N/A N/A Wounding Event: Skin T ear N/A N/A Primary Etiology: Cataracts, Hypertension, N/A N/A Comorbid History: Osteoarthritis 06/08/2021 N/A N/A Date  Acquired: 2 N/A N/A Weeks of Treatment: Open N/A N/A Wound Status: 1.2x1x0.1 N/A N/A Measurements L x W x D (cm) 0.942 N/A N/A A (cm) : rea 0.094 N/A N/A Volume (cm) : 94.70% N/A N/A % Reduction in Area: 94.70% N/A N/A % Reduction in Volume: Full Thickness Without Exposed N/A N/A Classification: Support Structures Medium N/A N/A Exudate Amount: Serosanguineous N/A N/A Exudate Type: red, brown N/A N/A Exudate Color: Flat and Intact  N/A N/A Wound Margin: Large (67-100%) N/A N/A Granulation Amount: Red N/A N/A Granulation Quality: None Present (0%) N/A N/A Necrotic Amount: Fat Layer (Subcutaneous Tissue): Yes N/A N/A Exposed Structures: Fascia: No Tendon: No Muscle: No Joint: No Bone: No Small (1-33%) N/A N/A Epithelialization: Treatment Notes Wound #1 (Forearm) Wound Laterality: Right Cleanser Normal Saline Discharge Instruction: Cleanse the wound with Normal Saline prior to applying a clean dressing using gauze sponges, not tissue or cotton balls. Soap and Water Discharge Instruction: May shower and wash wound with dial antibacterial soap and water prior to dressing change. Peri-Wound Care Topical Primary Dressing FIBRACOL Plus Dressing, 2x2 in (collagen) Discharge Instruction: Moisten collagen with use [place] Jelly Secondary Dressing T Non-Adherent Dressing, 3x4 in elfa Discharge Instruction: Apply over primary dressing as directed. Secured With Conforming Stretch Gauze Bandage, Sterile 2x75 (in/in) Discharge Instruction: Secure with stretch gauze as directed. Paper Tape, 2x10 (in/yd) Discharge Instruction: Secure dressing with tape as directed. Compression Wrap Compression Stockings Add-Ons Electronic Signature(s) Signed: 06/26/2021 9:34:00 AM By: Kalman Shan DO Signed: 06/26/2021 5:33:16 PM By: Deon Pilling RN, BSN Entered By: Kalman Shan on 06/26/2021  09:20:41 -------------------------------------------------------------------------------- Multi-Disciplinary Care Plan Details Patient Name: Date of Service: Michelle TO [name] 06/26/2021 8:15 A M Medical Record Number: [id number] Patient Account Number: [id number] Date of Birth/Sex: Treating RN: 1930-08-03 (87 y.o. Elam Dutch Primary Care Ulah Olmo: Maury Dus Other Clinician: Referring Alla Sloma: Treating Lyndle Pang/Extender: Joanne Chars in Treatment: 2 Multidisciplinary Care Plan reviewed with physician Active Inactive Abuse / Safety / Falls / [place] Management Nursing Diagnoses: History of Falls Impaired physical mobility Potential for falls Goals: Patient will remain injury free related to falls Date Initiated: 06/12/2021 Target Resolution Date: 07/10/2021 Goal Status: Active Patient/caregiver will verbalize/demonstrate measures taken to prevent injury and/or falls Date Initiated: 06/12/2021 Target Resolution Date: 07/10/2021 Goal Status: Active Interventions: Assess fall risk on admission and as needed Assess impairment of mobility on admission and as needed per policy Notes: Wound/Skin Impairment Nursing Diagnoses: Impaired tissue integrity Knowledge deficit related to ulceration/compromised skin integrity Goals: Patient/caregiver will verbalize understanding of skin care regimen Date Initiated: 06/12/2021 Target Resolution Date: 07/10/2021 Goal Status: Active Ulcer/skin breakdown will have a volume reduction of 30% by week 4 Date Initiated: 06/12/2021 Target Resolution Date: 07/10/2021 Goal Status: Active Interventions: Assess patient/caregiver ability to obtain necessary supplies Assess patient/caregiver ability to perform ulcer/skin care regimen upon admission and as needed Assess ulceration(s) every visit Provide education on ulcer and skin care Treatment Activities: Skin care regimen initiated : 06/12/2021 Topical wound  management initiated : 06/12/2021 Notes: Electronic Signature(s) Signed: 06/26/2021 5:46:53 PM By: Baruch Gouty RN, BSN Entered By: Baruch Gouty on 06/26/2021 08:19:24 -------------------------------------------------------------------------------- Pain Assessment Details Patient Name: Date of Service: Michelle TO [name] 06/26/2021 8:15 A M Medical Record Number: [id number] Patient Account Number: [id number] Date of Birth/Sex: Treating RN: February 15, 1930 (87 y.o. Elam Dutch Primary Care Agripina Guyette: Maury Dus Other Clinician: Referring Jesusmanuel Erbes: Treating Ladawn Boullion/Extender: Joanne Chars in Treatment: 2 Active Problems Location of Pain Severity and Description of Pain Patient Has Paino Yes Site Locations Pain Location: Pain in Ulcers With Dressing Change: Yes Duration of the Pain. Constant / Intermittento Intermittent Rate the pain. Current Pain Level: 2 Worst Pain Level: 3 Least Pain Level: 0 Character of Pain Describe the Pain: Tender, Other: sore Pain Management and Medication Current Pain Management: Other: tolerable Is the Current Pain Management Adequate: Adequate How does your wound impact your activities of daily livingo  Sleep: No Bathing: No Appetite: No Relationship With Others: No Bladder Continence: No Emotions: No Bowel Continence: No Work: No Toileting: No Drive: No Dressing: No Hobbies: No Electronic Signature(s) Signed: 06/26/2021 5:46:53 PM By: Baruch Gouty RN, BSN Entered By: Baruch Gouty on 06/26/2021 08:12:48 -------------------------------------------------------------------------------- Patient/Caregiver Education Details Patient Name: Date of Service: Michelle TO Tia Masker 11/15/2022andnbsp8:15 A M Medical Record Number: [id number] Patient Account Number: [id number] Date of Birth/Gender: Treating RN: Jan 03, 1930 (87 y.o. Elam Dutch Primary Care Physician: Maury Dus Other  Clinician: Referring Physician: Treating Physician/Extender: Joanne Chars in Treatment: 2 Education Assessment Education Provided To: Patient Education Topics Provided Wound/Skin Impairment: Methods: Explain/Verbal Responses: Reinforcements needed, State content correctly Electronic Signature(s) Signed: 06/26/2021 5:46:53 PM By: Baruch Gouty RN, BSN Entered By: Baruch Gouty on 06/26/2021 08:19:41 -------------------------------------------------------------------------------- Wound Assessment Details Patient Name: Date of Service: Michelle TO [name] 06/26/2021 8:15 A M Medical Record Number: [id number] Patient Account Number: [id number] Date of Birth/Sex: Treating RN: 09/10/1929 (87 y.o. Elam Dutch Primary Care Ashauna Bertholf: Maury Dus Other Clinician: Referring Annissa Andreoni: Treating Kacey Vicuna/Extender: Joanne Chars in Treatment: 2 Wound Status Wound Number: 1 Primary Etiology: Skin Tear Wound Location: Right Forearm Wound Status: Open Wounding Event: Trauma Comorbid History: Cataracts, Hypertension, Osteoarthritis Date Acquired: 06/08/2021 Weeks Of Treatment: 2 Clustered Wound: No Photos Wound Measurements Length: (cm) 1.2 Width: (cm) 1 Depth: (cm) 0.1 Area: (cm) 0.942 Volume: (cm) 0.094 % Reduction in Area: 94.7% % Reduction in Volume: 94.7% Epithelialization: Small (1-33%) Tunneling: No Undermining: No Wound Description Classification: Full Thickness Without Exposed Support Structures Wound Margin: Flat and Intact Exudate Amount: Medium Exudate Type: Serosanguineous Exudate Color: red, brown Foul Odor After Cleansing: No Slough/Fibrino No Wound Bed Granulation Amount: Large (67-100%) Exposed Structure Granulation Quality: Red Fascia Exposed: No Necrotic Amount: None Present (0%) Fat Layer (Subcutaneous Tissue) Exposed: Yes Tendon Exposed: No Muscle Exposed: No Joint Exposed: No Bone  Exposed: No Treatment Notes Wound #1 (Forearm) Wound Laterality: Right Cleanser Normal Saline Discharge Instruction: Cleanse the wound with Normal Saline prior to applying a clean dressing using gauze sponges, not tissue or cotton balls. Soap and Water Discharge Instruction: May shower and wash wound with dial antibacterial soap and water prior to dressing change. Peri-Wound Care Topical Primary Dressing FIBRACOL Plus Dressing, 2x2 in (collagen) Discharge Instruction: Moisten collagen with use [place] Jelly Secondary Dressing T Non-Adherent Dressing, 3x4 in elfa Discharge Instruction: Apply over primary dressing as directed. Secured With Conforming Stretch Gauze Bandage, Sterile 2x75 (in/in) Discharge Instruction: Secure with stretch gauze as directed. Paper Tape, 2x10 (in/yd) Discharge Instruction: Secure dressing with tape as directed. Compression Wrap Compression Stockings Add-Ons Electronic Signature(s) Signed: 06/26/2021 1:54:26 PM By: Sandre Kitty Signed: 06/26/2021 5:46:53 PM By: Baruch Gouty RN, BSN Entered By: Sandre Kitty on 06/26/2021 08:18:18 -------------------------------------------------------------------------------- Vitals Details Patient Name: Date of Service: Michelle TO [name] 06/26/2021 8:15 A M Medical Record Number: [id number] Patient Account Number: [id number] Date of Birth/Sex: Treating RN: August 21, 1929 (87 y.o. Elam Dutch Primary Care Lacretia Tindall: Maury Dus Other Clinician: Referring Khilee Hendricksen: Treating Zakhi Dupre/Extender: Joanne Chars in Treatment: 2 Vital Signs Time Taken: 08:11 Temperature (F): 98 Height (in): 63 Pulse (bpm): 61 Weight (lbs): 145 Respiratory Rate (breaths/min): 18 Body Mass Index (BMI): 25.7 Blood Pressure (mmHg): 127/69 Reference Range: 80 - 120 mg / dl Electronic Signature(s) Signed: 06/26/2021 5:46:53 PM By: Baruch Gouty RN, BSN Entered By: Baruch Gouty on 06/26/2021  08:11:46

## 2021-06-26 NOTE — Progress Notes (Signed)
**Note Michelle-Identified via Obfuscation** MAKYLA, BYE ([id number]) Visit Report for 06/26/2021 Chief Complaint Document Details Patient Name: Date of Service: DEA TO Jones, Michelle Jones 06/26/2021 8:15 A M Medical Record Number: [id number] Patient Account Number: [id number] Date of Birth/Sex: Treating RN: 1930-02-02 (87 y.o. Michelle Jones Primary Care Provider: Maury Dus Other Clinician: Referring Provider: Treating Provider/Extender: Joanne Chars in Treatment: 2 Information Obtained from: Patient Chief Complaint Right arm skin tear Electronic Signature(s) Signed: 06/26/2021 9:34:00 AM By: Kalman Shan DO Entered By: Kalman Shan on 06/26/2021 09:20:58 -------------------------------------------------------------------------------- HPI Details Patient Name: Date of Service: [name] 06/26/2021 8:15 A M Medical Record Number: [id number] Patient Account Number: [id number] Date of Birth/Sex: Treating RN: [birth year] (87 y.o. Michelle Jones Primary Care Provider: Maury Dus Other Clinician: Referring Provider: Treating Provider/Extender: Joanne Chars in Treatment: 2 History of Present Illness HPI Description: Admission 06/12/2021 [name] is an 87 year old female with a past medical history of essential hypertension and hypothyroidism that presents to the clinic for a right arm skin tear that occurred 4 days ago. She states she was chasing a bug and successfully caught it but developed a skin tear. It is unclear if she fell or not. She has been using antibiotic ointment to the area. She denies signs of infection. 11/8; patient presents for follow-up. She reports mild tenderness to the wound bed. She has no issues with the collagen dressings. She denies signs of infection. 11/15; patient presents for 1 week follow-up. She has no issues or complaints today. She denies signs of infection. She reports improvement in wound healing. Electronic  Signature(s) Signed: 06/26/2021 9:34:00 AM By: Kalman Shan DO Entered By: Kalman Shan on 06/26/2021 09:21:21 -------------------------------------------------------------------------------- Physical Exam Details Patient Name: Date of Service: [name] 06/26/2021 8:15 A M Medical Record Number: [id number] Patient Account Number: [id number] Date of Birth/Sex: Treating RN: [birth year] (87 y.o. Michelle Jones Primary Care Provider: Maury Dus Other Clinician: Referring Provider: Treating Provider/Extender: Joanne Chars in Treatment: 2 Constitutional respirations regular, non-labored and within target range for patient.Marland Kitchen Psychiatric pleasant and cooperative. Notes Right forearm: Skin tear with previous overlying epidermis is now intact and mostly adhered. Open wound with granulation tissue present. Appears well-healing. No signs of infection. Electronic Signature(s) Signed: 06/26/2021 9:34:00 AM By: Kalman Shan DO Entered By: Kalman Shan on 06/26/2021 09:27:37 -------------------------------------------------------------------------------- Physician Orders Details Patient Name: Date of Service: [name] 06/26/2021 8:15 A M Medical Record Number: [id number] Patient Account Number: [id number] Date of Birth/Sex: Treating RN: 1930/07/22 (87 y.o. Elam Dutch Primary Care Provider: Maury Dus Other Clinician: Referring Provider: Treating Provider/Extender: Joanne Chars in Treatment: 2 Verbal / Phone Orders: No Diagnosis Coding Follow-up Appointments ppointment in 2 weeks. - Dr. Heber [place] Return A Bathing/ Shower/ Hygiene May shower and wash wound with soap and water. Wound Treatment Wound #1 - Forearm Wound Laterality: Right Cleanser: Normal Saline (Generic) Every Other Day/15 Days Discharge Instructions: Cleanse the wound with Normal Saline prior to applying a clean dressing using  gauze sponges, not tissue or cotton balls. Cleanser: Soap and Water Every Other Day/15 Days Discharge Instructions: May shower and wash wound with dial antibacterial soap and water prior to dressing change. Prim Dressing: FIBRACOL Plus Dressing, 2x2 in (collagen) (Dispense As Written) Every Other Day/15 Days ary Discharge Instructions: Moisten collagen with use [place] Jelly Secondary Dressing: T Non-Adherent Dressing, 3x4 in (Generic) Every Other Day/15 Days elfa Discharge Instructions: Apply over  primary dressing as directed. Secured With: Child psychotherapist, Sterile 2x75 (in/in) (Generic) Every Other Day/15 Days Discharge Instructions: Secure with stretch gauze as directed. Secured With: Paper Tape, 2x10 (in/yd) (Generic) Every Other Day/15 Days Discharge Instructions: Secure dressing with tape as directed. Electronic Signature(s) Signed: 06/26/2021 9:34:00 AM By: Kalman Shan DO Entered By: Kalman Shan on 06/26/2021 09:27:58 -------------------------------------------------------------------------------- Problem List Details Patient Name: Date of Service: [name] 06/26/2021 8:15 A M Medical Record Number: [id number] Patient Account Number: [id number] Date of Birth/Sex: Treating RN: 08-02-1930 (87 y.o. Michelle Jones Primary Care Provider: Maury Dus Other Clinician: Referring Provider: Treating Provider/Extender: Joanne Chars in Treatment: 2 Active Problems ICD-10 Encounter Code Description Active Date MDM Diagnosis S41.101D Unspecified open wound of right upper arm, subsequent encounter 06/12/2021 No Yes E03.9 Hypothyroidism, unspecified 06/12/2021 No Yes I10 Essential (primary) hypertension 06/12/2021 No Yes Inactive Problems Resolved Problems Electronic Signature(s) Signed: 06/26/2021 9:34:00 AM By: Kalman Shan DO Entered By: Kalman Shan on 06/26/2021  09:20:26 -------------------------------------------------------------------------------- Progress Note Details Patient Name: Date of Service: [name] 06/26/2021 8:15 A M Medical Record Number: [id number] Patient Account Number: [id number] Date of Birth/Sex: Treating RN: 1930-08-10 (87 y.o. Michelle Jones Primary Care Provider: Maury Dus Other Clinician: Referring Provider: Treating Provider/Extender: Joanne Chars in Treatment: 2 Subjective Chief Complaint Information obtained from Patient Right arm skin tear History of Present Illness (HPI) Admission 06/12/2021 [name] is an 87 year old female with a past medical history of essential hypertension and hypothyroidism that presents to the clinic for a right arm skin tear that occurred 4 days ago. She states she was chasing a bug and successfully caught it but developed a skin tear. It is unclear if she fell or not. She has been using antibiotic ointment to the area. She denies signs of infection. 11/8; patient presents for follow-up. She reports mild tenderness to the wound bed. She has no issues with the collagen dressings. She denies signs of infection. 11/15; patient presents for 1 week follow-up. She has no issues or complaints today. She denies signs of infection. She reports improvement in wound healing. Patient History Information obtained from Patient, Caregiver, Chart. Family History Cancer - Mother,Siblings, Diabetes - Siblings, Heart Disease - Father, Hypertension - Mother, Kidney Disease - Siblings, No family history of Hereditary Spherocytosis, Lung Disease, Seizures, Stroke, Thyroid Problems, Tuberculosis. Social History Never smoker, Marital Status - Widowed, Alcohol Use - Never, Drug Use - No History, Caffeine Use - Moderate. Medical History Eyes Patient has history of Cataracts - bil removed Denies history of Glaucoma, Optic Neuritis Cardiovascular Patient has  history of Hypertension Endocrine Denies history of Type I Diabetes, Type II Diabetes Genitourinary Denies history of End Stage Renal Disease Integumentary (Skin) Denies history of History of Burn Musculoskeletal Patient has history of Osteoarthritis Denies history of Gout, Rheumatoid Arthritis, Osteomyelitis Oncologic Denies history of Received Chemotherapy, Received Radiation Psychiatric Denies history of Anorexia/bulimia, Confinement Anxiety Hospitalization/Surgery History - ERCP. - sphinterotomy. - pancreatic stent placement. - removal of gallstone. - carpel tunnel release right. - appendectomy. - tonsilectomy. - vaginal hysterectomy. Medical A Surgical History Notes nd Gastrointestinal gallstones Endocrine hypothyroidism Genitourinary right ureteral stone, CKD Musculoskeletal chronic back pain Psychiatric depression Objective Constitutional respirations regular, non-labored and within target range for patient.. Vitals Time Taken: 8:11 AM, Height: 63 in, Weight: 145 lbs, BMI: 25.7, Temperature: 98 F, Pulse: 61 bpm, Respiratory Rate: 18 breaths/min, Blood Pressure: 127/69 mmHg. Psychiatric pleasant and cooperative.  General Notes: Right forearm: Skin tear with previous overlying epidermis is now intact and mostly adhered. Open wound with granulation tissue present. Appears well-healing. No signs of infection. Integumentary (Hair, Skin) Wound #1 status is Open. Original cause of wound was Trauma. The date acquired was: 06/08/2021. The wound has been in treatment 2 weeks. The wound is located on the Right Forearm. The wound measures 1.2cm length x 1cm width x 0.1cm depth; 0.942cm^2 area and 0.094cm^3 volume. There is Fat Layer (Subcutaneous Tissue) exposed. There is no tunneling or undermining noted. There is a medium amount of serosanguineous drainage noted. The wound margin is flat and intact. There is large (67-100%) red granulation within the wound bed. There is no  necrotic tissue within the wound bed. Assessment Active Problems ICD-10 Unspecified open wound of right upper arm, subsequent encounter Hypothyroidism, unspecified Essential (primary) hypertension Patient's wound has shown improvement in size and appearance since last clinic visit. She is healing up nicely. I recommended continuing collagen to the open areas with granulation tissue. No signs of infection on exam. Follow-up in 2 weeks. I am hopeful she will be healed by then. Plan Follow-up Appointments: Return Appointment in 2 weeks. - Dr. Heber [place] Bathing/ Shower/ Hygiene: May shower and wash wound with soap and water. WOUND #1: - Forearm Wound Laterality: Right Cleanser: Normal Saline (Generic) Every Other Day/15 Days Discharge Instructions: Cleanse the wound with Normal Saline prior to applying a clean dressing using gauze sponges, not tissue or cotton balls. Cleanser: Soap and Water Every Other Day/15 Days Discharge Instructions: May shower and wash wound with dial antibacterial soap and water prior to dressing change. Prim Dressing: FIBRACOL Plus Dressing, 2x2 in (collagen) (Dispense As Written) Every Other Day/15 Days ary Discharge Instructions: Moisten collagen with use [place] Jelly Secondary Dressing: T Non-Adherent Dressing, 3x4 in (Generic) Every Other Day/15 Days elfa Discharge Instructions: Apply over primary dressing as directed. Secured With: Child psychotherapist, Sterile 2x75 (in/in) (Generic) Every Other Day/15 Days Discharge Instructions: Secure with stretch gauze as directed. Secured With: Paper Tape, 2x10 (in/yd) (Generic) Every Other Day/15 Days Discharge Instructions: Secure dressing with tape as directed. 1. Collagen 2. Follow-up in 2 weeks Electronic Signature(s) Signed: 06/26/2021 9:34:00 AM By: Kalman Shan DO Entered By: Kalman Shan on 06/26/2021 09:31:52 -------------------------------------------------------------------------------- HxROS  Details Patient Name: Date of Service: [name] 06/26/2021 8:15 A M Medical Record Number: [id number] Patient Account Number: [id number] Date of Birth/Sex: Treating RN: 1930-05-25 (87 y.o. Michelle Jones Primary Care Provider: Maury Dus Other Clinician: Referring Provider: Treating Provider/Extender: Joanne Chars in Treatment: 2 Information Obtained From Patient Caregiver Chart Eyes Medical History: Positive for: Cataracts - bil removed Negative for: Glaucoma; Optic Neuritis Cardiovascular Medical History: Positive for: Hypertension Gastrointestinal Medical History: Past Medical History Notes: gallstones Endocrine Medical History: Negative for: Type I Diabetes; Type II Diabetes Past Medical History Notes: hypothyroidism Genitourinary Medical History: Negative for: End Stage Renal Disease Past Medical History Notes: right ureteral stone, CKD Integumentary (Skin) Medical History: Negative for: History of Burn Musculoskeletal Medical History: Positive for: Osteoarthritis Negative for: Gout; Rheumatoid Arthritis; Osteomyelitis Past Medical History Notes: chronic back pain Oncologic Medical History: Negative for: Received Chemotherapy; Received Radiation Psychiatric Medical History: Negative for: Anorexia/bulimia; Confinement Anxiety Past Medical History Notes: depression HBO Extended History Items Eyes: Cataracts Immunizations Pneumococcal Vaccine: Received Pneumococcal Vaccination: No Implantable Devices No devices added Hospitalization / Surgery History Type of Hospitalization/Surgery ERCP sphinterotomy pancreatic stent placement removal of gallstone carpel tunnel release right  appendectomy tonsilectomy vaginal hysterectomy Family and Social History Cancer: Yes - Mother,Siblings; Diabetes: Yes - Siblings; Heart Disease: Yes - Father; Hereditary Spherocytosis: No; Hypertension: Yes - Mother; Kidney Disease:  Yes - Siblings; Lung Disease: No; Seizures: No; Stroke: No; Thyroid Problems: No; Tuberculosis: No; Never smoker; Marital Status - Widowed; Alcohol Use: Never; Drug Use: No History; Caffeine Use: Moderate; Financial Concerns: No; Food, Clothing or Shelter Needs: No; Support System Lacking: No; Transportation Concerns: No Electronic Signature(s) Signed: 06/26/2021 9:34:00 AM By: Kalman Shan DO Signed: 06/26/2021 5:33:16 PM By: Deon Pilling RN, BSN Entered By: Kalman Shan on 06/26/2021 09:23:12 -------------------------------------------------------------------------------- SuperBill Details Patient Name: Date of Service: [name] 06/26/2021 Medical Record Number: [id number] Patient Account Number: [id number] Date of Birth/Sex: Treating RN: [birth year] (87 y.o. Elam Dutch Primary Care Provider: Maury Dus Other Clinician: Referring Provider: Treating Provider/Extender: Joanne Chars in Treatment: 2 Diagnosis Coding ICD-10 Codes Code Description S41.101D Unspecified open wound of right upper arm, subsequent encounter E03.9 Hypothyroidism, unspecified I10 Essential (primary) hypertension Facility Procedures CPT4 Code: [id number] Description: 99213 - WOUND CARE VISIT-LEV 3 EST PT Modifier: Quantity: 1 Physician Procedures : CPT4 Code Description Modifier [id number] 99213 - WC PHYS LEVEL 3 - EST PT ICD-10 Diagnosis Description S41.101D Unspecified open wound of right upper arm, subsequent encounter E03.9 Hypothyroidism, unspecified I10 Essential (primary) hypertension Quantity: 1 Electronic Signature(s) Signed: 06/26/2021 9:34:00 AM By: Kalman Shan DO Entered By: Kalman Shan on 06/26/2021 09:33:01

## 2021-07-10 ENCOUNTER — Other Ambulatory Visit: Payer: Self-pay

## 2021-07-10 ENCOUNTER — Encounter (HOSPITAL_BASED_OUTPATIENT_CLINIC_OR_DEPARTMENT_OTHER): Payer: Medicare Other | Admitting: Internal Medicine

## 2021-07-10 DIAGNOSIS — E039 Hypothyroidism, unspecified: Secondary | ICD-10-CM | POA: Diagnosis not present

## 2021-07-10 DIAGNOSIS — I10 Essential (primary) hypertension: Secondary | ICD-10-CM | POA: Diagnosis not present

## 2021-07-10 DIAGNOSIS — S41101A Unspecified open wound of right upper arm, initial encounter: Secondary | ICD-10-CM | POA: Diagnosis not present

## 2021-07-10 DIAGNOSIS — L958 Other vasculitis limited to the skin: Secondary | ICD-10-CM

## 2021-07-10 DIAGNOSIS — L98412 Non-pressure chronic ulcer of buttock with fat layer exposed: Secondary | ICD-10-CM

## 2021-07-10 NOTE — Progress Notes (Signed)
JIMMA, ORTMAN ([id number]) Visit Report for 07/10/2021 Chief Complaint Document Details Patient Name: Date of Service: DEA TO KIARRAH, RAUSCH 07/10/2021 8:30 A M Medical Record Number: [id number] Patient Account Number: [id number] Date of Birth/Sex: Treating RN: [birth year] (87 y.o. F) Primary Care Provider: Maury Dus Other Clinician: Referring Provider: Treating Provider/Extender: Joanne Chars in Treatment: 4 Information Obtained from: Patient Chief Complaint Right arm skin tear Electronic Signature(s) Signed: 07/10/2021 8:54:54 AM By: Kalman Shan DO Entered By: Kalman Shan on 07/10/2021 08:49:59 -------------------------------------------------------------------------------- HPI Details Patient Name: Date of Service: [name] 07/10/2021 8:30 A M Medical Record Number: [id number] Patient Account Number: [id number] Date of Birth/Sex: Treating RN: Mar 02, 1930 (87 y.o. F) Primary Care Provider: Maury Dus Other Clinician: Referring Provider: Treating Provider/Extender: Joanne Chars in Treatment: 4 History of Present Illness HPI Description: Admission 06/12/2021 Ms. Calen Posch is an 87 year old female with a past medical history of essential hypertension and hypothyroidism that presents to the clinic for a right arm skin tear that occurred 4 days ago. She states she was chasing a bug and successfully caught it but developed a skin tear. It is unclear if she fell or not. She has been using antibiotic ointment to the area. She denies signs of infection. 11/8; patient presents for follow-up. She reports mild tenderness to the wound bed. She has no issues with the collagen dressings. She denies signs of infection. 11/15; patient presents for 1 week follow-up. She has no issues or complaints today. She denies signs of infection. She reports improvement in wound healing. 11/29; patient presents for 2 week follow-up. She  has no issues or complaints today. She reports that her wound is healed. Electronic Signature(s) Signed: 07/10/2021 8:54:54 AM By: Kalman Shan DO Entered By: Kalman Shan on 07/10/2021 08:51:07 -------------------------------------------------------------------------------- Physical Exam Details Patient Name: Date of Service: [name] 07/10/2021 8:30 A M Medical Record Number: [id number] Patient Account Number: [id number] Date of Birth/Sex: Treating RN: [birth year] (87 y.o. F) Primary Care Provider: Maury Dus Other Clinician: Referring Provider: Treating Provider/Extender: Joanne Chars in Treatment: 4 Constitutional respirations regular, non-labored and within target range for patient.Marland Kitchen Psychiatric pleasant and cooperative. Notes Right forearm: Epithelialization to the previous skin tear site. Surrounding skin is intact. Electronic Signature(s) Signed: 07/10/2021 8:54:54 AM By: Kalman Shan DO Entered By: Kalman Shan on 07/10/2021 08:51:43 -------------------------------------------------------------------------------- Physician Orders Details Patient Name: Date of Service: [name] 07/10/2021 8:30 A M Medical Record Number: [id number] Patient Account Number: [id number] Date of Birth/Sex: Treating RN: [birth year] (87 y.o. Elam Dutch Primary Care Provider: Maury Dus Other Clinician: Referring Provider: Treating Provider/Extender: Joanne Chars in Treatment: 4 Verbal / Phone Orders: No Diagnosis Coding Discharge From [facility] Services Discharge from [place] Non Wound Condition pply the following to affected area as directed: - vaseline or lotion to healed area on right arm daily A Electronic Signature(s) Signed: 07/10/2021 8:54:54 AM By: Kalman Shan DO Entered By: Kalman Shan on 07/10/2021  08:51:54 -------------------------------------------------------------------------------- Problem List Details Patient Name: Date of Service: [name] 07/10/2021 8:30 A M Medical Record Number: [id number] Patient Account Number: [id number] Date of Birth/Sex: Treating RN: [birth year] (87 y.o. F) Primary Care Provider: Maury Dus Other Clinician: Referring Provider: Treating Provider/Extender: Joanne Chars in Treatment: 4 Active Problems ICD-10 Encounter Code Description Active Date MDM Diagnosis S41.101D Unspecified open wound of right upper arm, subsequent encounter 06/12/2021 No Yes  E03.9 Hypothyroidism, unspecified 06/12/2021 No Yes I10 Essential (primary) hypertension 06/12/2021 No Yes Inactive Problems Resolved Problems Electronic Signature(s) Signed: 07/10/2021 8:54:54 AM By: Kalman Shan DO Entered By: Kalman Shan on 07/10/2021 08:49:43 -------------------------------------------------------------------------------- Progress Note Details Patient Name: Date of Service: [name] 07/10/2021 8:30 A M Medical Record Number: [id number] Patient Account Number: [id number] Date of Birth/Sex: Treating RN: 09-01-1929 (87 y.o. F) Primary Care Provider: Maury Dus Other Clinician: Referring Provider: Treating Provider/Extender: Joanne Chars in Treatment: 4 Subjective Chief Complaint Information obtained from Patient Right arm skin tear History of Present Illness (HPI) Admission 06/12/2021 Ms. Io Dieujuste is an 87 year old female with a past medical history of essential hypertension and hypothyroidism that presents to the clinic for a right arm skin tear that occurred 4 days ago. She states she was chasing a bug and successfully caught it but developed a skin tear. It is unclear if she fell or not. She has been using antibiotic ointment to the area. She denies signs of infection. 11/8; patient  presents for follow-up. She reports mild tenderness to the wound bed. She has no issues with the collagen dressings. She denies signs of infection. 11/15; patient presents for 1 week follow-up. She has no issues or complaints today. She denies signs of infection. She reports improvement in wound healing. 11/29; patient presents for 2 week follow-up. She has no issues or complaints today. She reports that her wound is healed. Patient History Information obtained from Patient, Caregiver, Chart. Family History Cancer - Mother,Siblings, Diabetes - Siblings, Heart Disease - Father, Hypertension - Mother, Kidney Disease - Siblings, No family history of Hereditary Spherocytosis, Lung Disease, Seizures, Stroke, Thyroid Problems, Tuberculosis. Social History Never smoker, Marital Status - Widowed, Alcohol Use - Never, Drug Use - No History, Caffeine Use - Moderate. Medical History Eyes Patient has history of Cataracts - bil removed Denies history of Glaucoma, Optic Neuritis Cardiovascular Patient has history of Hypertension Endocrine Denies history of Type I Diabetes, Type II Diabetes Genitourinary Denies history of End Stage Renal Disease Integumentary (Skin) Denies history of History of Burn Musculoskeletal Patient has history of Osteoarthritis Denies history of Gout, Rheumatoid Arthritis, Osteomyelitis Oncologic Denies history of Received Chemotherapy, Received Radiation Psychiatric Denies history of Anorexia/bulimia, Confinement Anxiety Hospitalization/Surgery History - ERCP. - sphinterotomy. - pancreatic stent placement. - removal of gallstone. - carpel tunnel release right. - appendectomy. - tonsilectomy. - vaginal hysterectomy. Medical A Surgical History Notes nd Gastrointestinal gallstones Endocrine hypothyroidism Genitourinary right ureteral stone, CKD Musculoskeletal chronic back pain Psychiatric depression Objective Constitutional respirations regular, non-labored and  within target range for patient.. Vitals Time Taken: 8:31 AM, Height: 63 in, Weight: 145 lbs, BMI: 25.7, Temperature: 97.6 F, Pulse: 73 bpm, Respiratory Rate: 18 breaths/min, Blood Pressure: 116/75 mmHg. Psychiatric pleasant and cooperative. General Notes: Right forearm: Epithelialization to the previous skin tear site. Surrounding skin is intact. Integumentary (Hair, Skin) Wound #1 status is Healed - Epithelialized. Original cause of wound was Trauma. The date acquired was: 06/08/2021. The wound has been in treatment 4 weeks. The wound is located on the Right Forearm. The wound measures 0cm length x 0cm width x 0cm depth; 0cm^2 area and 0cm^3 volume. There is no tunneling or undermining noted. There is a none present amount of drainage noted. There is no granulation within the wound bed. There is no necrotic tissue within the wound bed. Assessment Active Problems ICD-10 Unspecified open wound of right upper arm, subsequent encounter Hypothyroidism, unspecified Essential (primary) hypertension Patient has done  well with collagen. Her wound is healed. I recommended Vaseline daily to the previous wound site. She may follow-up as needed. Plan Discharge From [facility] Services: Discharge from [place] Non Wound Condition: Apply the following to affected area as directed: - vaseline or lotion to healed area on right arm daily 1. Discharge from clinic due to closed wound 2. Follow-up as needed 3. Vaseline Electronic Signature(s) Signed: 07/10/2021 8:54:54 AM By: Kalman Shan DO Entered By: Kalman Shan on 07/10/2021 08:52:58 -------------------------------------------------------------------------------- HxROS Details Patient Name: Date of Service: [name] 07/10/2021 8:30 A M Medical Record Number: [id number] Patient Account Number: [id number] Date of Birth/Sex: Treating RN: [birth year] (87 y.o. F) Primary Care Provider: Maury Dus Other Clinician: Referring  Provider: Treating Provider/Extender: Joanne Chars in Treatment: 4 Information Obtained From Patient Caregiver Chart Eyes Medical History: Positive for: Cataracts - bil removed Negative for: Glaucoma; Optic Neuritis Cardiovascular Medical History: Positive for: Hypertension Gastrointestinal Medical History: Past Medical History Notes: gallstones Endocrine Medical History: Negative for: Type I Diabetes; Type II Diabetes Past Medical History Notes: hypothyroidism Genitourinary Medical History: Negative for: End Stage Renal Disease Past Medical History Notes: right ureteral stone, CKD Integumentary (Skin) Medical History: Negative for: History of Burn Musculoskeletal Medical History: Positive for: Osteoarthritis Negative for: Gout; Rheumatoid Arthritis; Osteomyelitis Past Medical History Notes: chronic back pain Oncologic Medical History: Negative for: Received Chemotherapy; Received Radiation Psychiatric Medical History: Negative for: Anorexia/bulimia; Confinement Anxiety Past Medical History Notes: depression HBO Extended History Items Eyes: Cataracts Immunizations Pneumococcal Vaccine: Received Pneumococcal Vaccination: No Implantable Devices No devices added Hospitalization / Surgery History Type of Hospitalization/Surgery ERCP sphinterotomy pancreatic stent placement removal of gallstone carpel tunnel release right appendectomy tonsilectomy vaginal hysterectomy Family and Social History Cancer: Yes - Mother,Siblings; Diabetes: Yes - Siblings; Heart Disease: Yes - Father; Hereditary Spherocytosis: No; Hypertension: Yes - Mother; Kidney Disease: Yes - Siblings; Lung Disease: No; Seizures: No; Stroke: No; Thyroid Problems: No; Tuberculosis: No; Never smoker; Marital Status - Widowed; Alcohol Use: Never; Drug Use: No History; Caffeine Use: Moderate; Financial Concerns: No; Food, Clothing or Shelter Needs: No; Support System  Lacking: No; Transportation Concerns: No Electronic Signature(s) Signed: 07/10/2021 8:54:54 AM By: Kalman Shan DO Entered By: Kalman Shan on 07/10/2021 08:51:17 -------------------------------------------------------------------------------- SuperBill Details Patient Name: Date of Service: [name] 07/10/2021 Medical Record Number: [id number] Patient Account Number: [id number] Date of Birth/Sex: Treating RN: 02-Jan-1930 (88 y.o. Elam Dutch Primary Care Provider: Maury Dus Other Clinician: Referring Provider: Treating Provider/Extender: Joanne Chars in Treatment: 4 Diagnosis Coding ICD-10 Codes Code Description S41.101D Unspecified open wound of right upper arm, subsequent encounter E03.9 Hypothyroidism, unspecified I10 Essential (primary) hypertension Facility Procedures CPT4 Code: [id number] Description: [phone] - WOUND CARE VISIT-LEV 2 EST PT Modifier: Quantity: 1 Physician Procedures : CPT4 Code Description Modifier [id number] - WC PHYS LEVEL 3 - EST PT ICD-10 Diagnosis Description S41.101D Unspecified open wound of right upper arm, subsequent encounter E03.9 Hypothyroidism, unspecified I10 Essential (primary) hypertension Quantity: 1 Electronic Signature(s) Signed: 07/10/2021 8:54:54 AM By: Kalman Shan DO Entered By: Kalman Shan on 07/10/2021 08:53:31

## 2021-07-10 NOTE — Progress Notes (Signed)
Michelle Jones, Michelle Jones ([id number]) Visit Report for 07/10/2021 Arrival Information Details Patient Name: Date of Service: DEA TO TALI, CLEAVES 07/10/2021 8:30 A M Medical Record Number: [id number] Patient Account Number: [id number] Date of Birth/Sex: Treating RN: 08/06/1930 (87 y.o. F) Primary Care Skilynn Durney: Maury Dus Other Clinician: Referring Jacai Kipp: Treating Shahram Alexopoulos/Extender: Joanne Chars in Treatment: 4 Visit Information History Since Last Visit Added or deleted any medications: No Patient Arrived: [name] Any new allergies or adverse reactions: No Arrival Time: 08:30 Had a fall or experienced change in No Accompanied By: Daughter-in-law activities of daily living that may affect Transfer Assistance: None risk of falls: Patient Requires Transmission-Based Precautions: No Signs or symptoms of abuse/neglect since last visito No Patient Has Alerts: No Hospitalized since last visit: No Implantable device outside of the clinic excluding No cellular tissue based products placed in the center since last visit: Has Dressing in Place as Prescribed: Yes Pain Present Now: Yes Electronic Signature(s) Signed: 07/10/2021 5:02:58 PM By: Dellie Catholic RN Entered By: Dellie Catholic on 07/10/2021 08:31:39 -------------------------------------------------------------------------------- Clinic Level of Care Assessment Details Patient Name: Date of Service: DEA TO AMEENA, VESEY 07/10/2021 8:30 A M Medical Record Number: [id number] Patient Account Number: [id number] Date of Birth/Sex: Treating RN: [birth year] (87 y.o. Elam Dutch Primary Care Zachary Lovins: Maury Dus Other Clinician: Referring Savilla Turbyfill: Treating Mikenzie Mccannon/Extender: Joanne Chars in Treatment: 4 Clinic Level of Care Assessment Items TOOL 4 Quantity Score []  - 0 Use when only an EandM is performed on FOLLOW-UP visit ASSESSMENTS - Nursing Assessment / Reassessment X- 1  10 Reassessment of Co-morbidities (includes updates in patient status) X- 1 5 Reassessment of Adherence to Treatment Plan ASSESSMENTS - Wound and Skin A ssessment / Reassessment X - Simple Wound Assessment / Reassessment - one wound 1 5 []  - 0 Complex Wound Assessment / Reassessment - multiple wounds []  - 0 Dermatologic / Skin Assessment (not related to wound area) ASSESSMENTS - Focused Assessment []  - 0 Circumferential Edema Measurements - multi extremities []  - 0 Nutritional Assessment / Counseling / Intervention []  - 0 Lower Extremity Assessment (monofilament, tuning fork, pulses) []  - 0 Peripheral Arterial Disease Assessment (using hand held doppler) ASSESSMENTS - Ostomy and/or Continence Assessment and Care []  - 0 Incontinence Assessment and Management []  - 0 Ostomy Care Assessment and Management (repouching, etc.) PROCESS - Coordination of Care X - Simple Patient / Family Education for ongoing care 1 15 []  - 0 Complex (extensive) Patient / Family Education for ongoing care X- 1 10 Staff obtains Programmer, systems, Records, T Results / Process Orders est []  - 0 Staff telephones HHA, Nursing Homes / Clarify orders / etc []  - 0 Routine Transfer to another Facility (non-emergent condition) []  - 0 Routine Hospital Admission (non-emergent condition) []  - 0 New Admissions / Biomedical engineer / Ordering NPWT Apligraf, etc. , []  - 0 Emergency Hospital Admission (emergent condition) X- 1 10 Simple Discharge Coordination []  - 0 Complex (extensive) Discharge Coordination PROCESS - Special Needs []  - 0 Pediatric / Minor Patient Management []  - 0 Isolation Patient Management []  - 0 Hearing / Language / Visual special needs []  - 0 Assessment of Community assistance (transportation, D/C planning, etc.) []  - 0 Additional assistance / Altered mentation []  - 0 Support Surface(s) Assessment (bed, cushion, seat, etc.) INTERVENTIONS - Wound Cleansing / Measurement X - Simple  Wound Cleansing - one wound 1 5 []  - 0 Complex Wound Cleansing - multiple wounds X- 1 5 Wound Imaging (photographs - any  number of wounds) []  - 0 Wound Tracing (instead of photographs) []  - 0 Simple Wound Measurement - one wound []  - 0 Complex Wound Measurement - multiple wounds INTERVENTIONS - Wound Dressings []  - 0 Small Wound Dressing one or multiple wounds []  - 0 Medium Wound Dressing one or multiple wounds []  - 0 Large Wound Dressing one or multiple wounds []  - 0 Application of Medications - topical []  - 0 Application of Medications - injection INTERVENTIONS - Miscellaneous []  - 0 External ear exam []  - 0 Specimen Collection (cultures, biopsies, blood, body fluids, etc.) []  - 0 Specimen(s) / Culture(s) sent or taken to Lab for analysis []  - 0 Patient Transfer (multiple staff / Civil Service fast streamer / Similar devices) []  - 0 Simple Staple / Suture removal (25 or less) []  - 0 Complex Staple / Suture removal (26 or more) []  - 0 Hypo / Hyperglycemic Management (close monitor of Blood Glucose) []  - 0 Ankle / Brachial Index (ABI) - do not check if billed separately X- 1 5 Vital Signs Has the patient been seen at the hospital within the last three years: Yes Total Score: 70 Level Of Care: New/Established - Level 2 Electronic Signature(s) Signed: 07/10/2021 5:53:22 PM By: Baruch Gouty RN, BSN Entered By: Baruch Gouty on 07/10/2021 08:46:57 -------------------------------------------------------------------------------- Encounter Discharge Information Details Patient Name: Date of Service: [name] 07/10/2021 8:30 A M Medical Record Number: [id number] Patient Account Number: [id number] Date of Birth/Sex: Treating RN: Feb 27, 1930 (87 y.o. Elam Dutch Primary Care Roopa Graver: Maury Dus Other Clinician: Referring Maxmillian Carsey: Treating Lexine Jaspers/Extender: Joanne Chars in Treatment: 4 Encounter Discharge Information Items Discharge  Condition: Stable Ambulatory Status: Cane Discharge Destination: Home Transportation: Private Auto Accompanied By: Charolett Bumpers in law Schedule Follow-up Appointment: Yes Clinical Summary of Care: Patient Declined Electronic Signature(s) Signed: 07/10/2021 5:53:22 PM By: Baruch Gouty RN, BSN Entered By: Baruch Gouty on 07/10/2021 08:47:37 -------------------------------------------------------------------------------- Lower Extremity Assessment Details Patient Name: Date of Service: [name] 07/10/2021 8:30 A M Medical Record Number: [id number] Patient Account Number: [id number] Date of Birth/Sex: Treating RN: 01/01/1930 (87 y.o. F) Primary Care Effie Janoski: Maury Dus Other Clinician: Referring Jalayia Bagheri: Treating Tamana Hatfield/Extender: Joanne Chars in Treatment: 4 Electronic Signature(s) Signed: 07/10/2021 5:02:58 PM By: Dellie Catholic RN Entered By: Dellie Catholic on 07/10/2021 08:37:26 -------------------------------------------------------------------------------- Multi Wound Chart Details Patient Name: Date of Service: [name] 07/10/2021 8:30 A M Medical Record Number: [id number] Patient Account Number: [id number] Date of Birth/Sex: Treating RN: [birth year] (87 y.o. F) Primary Care Adelard Sanon: Maury Dus Other Clinician: Referring Monterrio Gerst: Treating Nelani Schmelzle/Extender: Joanne Chars in Treatment: 4 Vital Signs Height(in): 63 Pulse(bpm): 52 Weight(lbs): 145 Blood Pressure(mmHg): 116/75 Body Mass Index(BMI): 26 Temperature(F): 97.6 Respiratory Rate(breaths/min): 18 Photos: [N/A:N/A] Right Forearm N/A N/A Wound Location: Trauma N/A N/A Wounding Event: Skin T ear N/A N/A Primary Etiology: Cataracts, Hypertension, N/A N/A Comorbid History: Osteoarthritis 06/08/2021 N/A N/A Date Acquired: 4 N/A N/A Weeks of Treatment: Healed - Epithelialized N/A N/A Wound Status: 0x0x0 N/A N/A Measurements L x W  x D (cm) 0 N/A N/A A (cm) : rea 0 N/A N/A Volume (cm) : 100.00% N/A N/A % Reduction in Area: 100.00% N/A N/A % Reduction in Volume: Full Thickness Without Exposed N/A N/A Classification: Support Structures None Present N/A N/A Exudate Amount: None Present (0%) N/A N/A Granulation Amount: None Present (0%) N/A N/A Necrotic Amount: Fascia: No N/A N/A Exposed Structures: Fat Layer (Subcutaneous Tissue): No Tendon: No  Muscle: No Joint: No Bone: No Large (67-100%) N/A N/A Epithelialization: Treatment Notes Electronic Signature(s) Signed: 07/10/2021 8:54:54 AM By: Kalman Shan DO Entered By: Kalman Shan on 07/10/2021 08:49:49 -------------------------------------------------------------------------------- Multi-Disciplinary Care Plan Details Patient Name: Date of Service: [name] 07/10/2021 8:30 A M Medical Record Number: [id number] Patient Account Number: [id number] Date of Birth/Sex: Treating RN: [birth year] (87 y.o. F) Primary Care Sharvil Hoey: Maury Dus Other Clinician: Referring Jenie Parish: Treating Harjas Biggins/Extender: Joanne Chars in Treatment: [place] reviewed with physician Active Inactive Electronic Signature(s) Signed: 07/10/2021 5:02:58 PM By: Dellie Catholic RN Entered By: Dellie Catholic on 07/10/2021 08:39:50 -------------------------------------------------------------------------------- Pain Assessment Details Patient Name: Date of Service: DEA TO AMERIE, [place]AUM[place] 07/10/2021 8:30 A M Medical Record Number: [id number] Patient Account Number: [id number] Date of Birth/Sex: Treating RN: 1930-06-12 (87 y.o. F) Primary Care Sharma Lawrance: Maury Dus Other Clinician: Referring Lawrencia Mauney: Treating Erla Bacchi/Extender: Joanne Chars in Treatment: 4 Active Problems Location of Pain Severity and Description of Pain Patient Has Paino Yes Site Locations Pain  Location: Generalized Pain With Dressing Change: No Duration of the Pain. Constant / Intermittento Constant Rate the pain. Current Pain Level: 7 Worst Pain Level: 10 Least Pain Level: 3 Tolerable Pain Level: 3 Character of Pain Describe the Pain: Aching Pain Management and Medication Current Pain Management: Medication: No Cold Application: No Rest: No Massage: No Activity: No T.E.N.S.: No Heat Application: No Leg drop or elevation: No Is the Current Pain Management Adequate: Adequate Notes Back pain Electronic Signature(s) Signed: 07/10/2021 5:02:58 PM By: Dellie Catholic RN Entered By: Dellie Catholic on 07/10/2021 08:37:15 -------------------------------------------------------------------------------- Patient/Caregiver Education Details Patient Name: Date of Service: [name] 11/29/2022andnbsp8:30 A M Medical Record Number: [id number] Patient Account Number: [id number] Date of Birth/Gender: Treating RN: 07-11-1930 (87 y.o. F) Primary Care Physician: Maury Dus Other Clinician: Referring Physician: Treating Physician/Extender: Joanne Chars in Treatment: 4 Education Assessment Education Provided To: Patient Education Topics Provided Wound/Skin Impairment: Methods: Explain/Verbal Responses: Reinforcements needed, State content correctly Electronic Signature(s) Signed: 07/10/2021 5:02:58 PM By: Dellie Catholic RN Entered By: Dellie Catholic on 07/10/2021 08:40:10 -------------------------------------------------------------------------------- Wound Assessment Details Patient Name: Date of Service: [name] 07/10/2021 8:30 A M Medical Record Number: [id number] Patient Account Number: [id number] Date of Birth/Sex: Treating RN: Aug 10, 1930 (87 y.o. Elam Dutch Primary Care Amarionna Arca: Maury Dus Other Clinician: Referring Alper Guilmette: Treating Avery Klingbeil/Extender: Joanne Chars in  Treatment: 4 Wound Status Wound Number: 1 Primary Etiology: Skin Tear Wound Location: Right Forearm Wound Status: Healed - Epithelialized Wounding Event: Trauma Comorbid History: Cataracts, Hypertension, Osteoarthritis Date Acquired: 06/08/2021 Weeks Of Treatment: 4 Clustered Wound: No Photos Wound Measurements Length: (cm) Width: (cm) Depth: (cm) Area: (cm) Volume: (cm) 0 % Reduction in Area: 100% 0 % Reduction in Volume: 100% 0 Epithelialization: Large (67-100%) 0 Tunneling: No 0 Undermining: No Wound Description Classification: Full Thickness Without Exposed Support Structures Exudate Amount: None Present Foul Odor After Cleansing: No Slough/Fibrino No Wound Bed Granulation Amount: None Present (0%) Exposed Structure Necrotic Amount: None Present (0%) Fascia Exposed: No Fat Layer (Subcutaneous Tissue) Exposed: No Tendon Exposed: No Muscle Exposed: No Joint Exposed: No Bone Exposed: No Electronic Signature(s) Signed: 07/10/2021 5:53:22 PM By: Baruch Gouty RN, BSN Entered By: Baruch Gouty on 07/10/2021 08:38:28 -------------------------------------------------------------------------------- [place] Details Patient Name: Date of Service: [name] 07/10/2021 8:30 A M Medical Record Number: [id number] Patient Account Number: [id number] Date of Birth/Sex: Treating RN: September 04, 1929 (87  y.o. F) Primary Care Treyden Hakim: Maury Dus Other Clinician: Referring Nesta Kimple: Treating Ariyana Faw/Extender: Joanne Chars in Treatment: 4 Vital Signs Time Taken: 08:31 Temperature (F): 97.6 Height (in): 63 Pulse (bpm): 73 Weight (lbs): 145 Respiratory Rate (breaths/min): 18 Body Mass Index (BMI): 25.7 Blood Pressure (mmHg): 116/75 Reference Range: 80 - 120 mg / dl Electronic Signature(s) Signed: 07/10/2021 5:02:58 PM By: Dellie Catholic RN Entered By: Dellie Catholic on 07/10/2021 08:34:17

## 2021-07-19 DIAGNOSIS — N3000 Acute cystitis without hematuria: Secondary | ICD-10-CM | POA: Diagnosis not present

## 2021-07-19 DIAGNOSIS — Z23 Encounter for immunization: Secondary | ICD-10-CM | POA: Diagnosis not present

## 2021-07-25 DIAGNOSIS — Z23 Encounter for immunization: Secondary | ICD-10-CM | POA: Diagnosis not present

## 2021-08-03 DIAGNOSIS — R443 Hallucinations, unspecified: Secondary | ICD-10-CM | POA: Diagnosis not present

## 2021-08-03 DIAGNOSIS — F4489 Other dissociative and conversion disorders: Secondary | ICD-10-CM | POA: Diagnosis not present

## 2021-08-03 DIAGNOSIS — N3000 Acute cystitis without hematuria: Secondary | ICD-10-CM | POA: Diagnosis not present

## 2021-08-14 DIAGNOSIS — N3000 Acute cystitis without hematuria: Secondary | ICD-10-CM | POA: Diagnosis not present

## 2021-08-15 DIAGNOSIS — G8929 Other chronic pain: Secondary | ICD-10-CM | POA: Diagnosis not present

## 2021-08-15 DIAGNOSIS — N1831 Chronic kidney disease, stage 3a: Secondary | ICD-10-CM | POA: Diagnosis not present

## 2021-08-15 DIAGNOSIS — E039 Hypothyroidism, unspecified: Secondary | ICD-10-CM | POA: Diagnosis not present

## 2021-08-15 DIAGNOSIS — I10 Essential (primary) hypertension: Secondary | ICD-10-CM | POA: Diagnosis not present

## 2021-08-15 DIAGNOSIS — F05 Delirium due to known physiological condition: Secondary | ICD-10-CM | POA: Diagnosis not present

## 2021-08-15 DIAGNOSIS — E782 Mixed hyperlipidemia: Secondary | ICD-10-CM | POA: Diagnosis not present

## 2021-08-15 DIAGNOSIS — F325 Major depressive disorder, single episode, in full remission: Secondary | ICD-10-CM | POA: Diagnosis not present

## 2021-08-15 DIAGNOSIS — I129 Hypertensive chronic kidney disease with stage 1 through stage 4 chronic kidney disease, or unspecified chronic kidney disease: Secondary | ICD-10-CM | POA: Diagnosis not present

## 2021-08-15 DIAGNOSIS — M159 Polyosteoarthritis, unspecified: Secondary | ICD-10-CM | POA: Diagnosis not present

## 2021-08-15 DIAGNOSIS — K219 Gastro-esophageal reflux disease without esophagitis: Secondary | ICD-10-CM | POA: Diagnosis not present

## 2021-09-03 DIAGNOSIS — N3000 Acute cystitis without hematuria: Secondary | ICD-10-CM | POA: Diagnosis not present

## 2021-09-06 DIAGNOSIS — N3020 Other chronic cystitis without hematuria: Secondary | ICD-10-CM | POA: Diagnosis not present

## 2021-09-06 DIAGNOSIS — F325 Major depressive disorder, single episode, in full remission: Secondary | ICD-10-CM | POA: Diagnosis not present

## 2021-09-06 DIAGNOSIS — K219 Gastro-esophageal reflux disease without esophagitis: Secondary | ICD-10-CM | POA: Diagnosis not present

## 2021-09-06 DIAGNOSIS — R7303 Prediabetes: Secondary | ICD-10-CM | POA: Diagnosis not present

## 2021-09-06 DIAGNOSIS — M159 Polyosteoarthritis, unspecified: Secondary | ICD-10-CM | POA: Diagnosis not present

## 2021-09-06 DIAGNOSIS — E782 Mixed hyperlipidemia: Secondary | ICD-10-CM | POA: Diagnosis not present

## 2021-09-06 DIAGNOSIS — N390 Urinary tract infection, site not specified: Secondary | ICD-10-CM | POA: Diagnosis not present

## 2021-09-06 DIAGNOSIS — N1832 Chronic kidney disease, stage 3b: Secondary | ICD-10-CM | POA: Diagnosis not present

## 2021-09-06 DIAGNOSIS — I129 Hypertensive chronic kidney disease with stage 1 through stage 4 chronic kidney disease, or unspecified chronic kidney disease: Secondary | ICD-10-CM | POA: Diagnosis not present

## 2021-09-06 DIAGNOSIS — Z0000 Encounter for general adult medical examination without abnormal findings: Secondary | ICD-10-CM | POA: Diagnosis not present

## 2021-09-06 DIAGNOSIS — E039 Hypothyroidism, unspecified: Secondary | ICD-10-CM | POA: Diagnosis not present

## 2021-09-06 DIAGNOSIS — I7300 Raynaud's syndrome without gangrene: Secondary | ICD-10-CM | POA: Diagnosis not present

## 2021-09-18 DIAGNOSIS — M25551 Pain in right hip: Secondary | ICD-10-CM | POA: Diagnosis not present

## 2021-09-18 DIAGNOSIS — R109 Unspecified abdominal pain: Secondary | ICD-10-CM | POA: Diagnosis not present

## 2021-09-28 ENCOUNTER — Other Ambulatory Visit: Payer: Self-pay

## 2021-09-28 ENCOUNTER — Emergency Department (HOSPITAL_COMMUNITY)
Admission: EM | Admit: 2021-09-28 | Discharge: 2021-09-28 | Disposition: A | Discharge status: Home / Self Care | Payer: Medicare Other | Attending: Emergency Medicine | Admitting: Emergency Medicine

## 2021-09-28 ENCOUNTER — Encounter (HOSPITAL_COMMUNITY): Payer: Self-pay

## 2021-09-28 ENCOUNTER — Emergency Department (HOSPITAL_COMMUNITY): Payer: Medicare Other

## 2021-09-28 DIAGNOSIS — M25552 Pain in left hip: Secondary | ICD-10-CM | POA: Diagnosis not present

## 2021-09-28 DIAGNOSIS — Z79899 Other long term (current) drug therapy: Secondary | ICD-10-CM | POA: Diagnosis not present

## 2021-09-28 DIAGNOSIS — M79605 Pain in left leg: Secondary | ICD-10-CM | POA: Diagnosis not present

## 2021-09-28 DIAGNOSIS — W07XXXA Fall from chair, initial encounter: Secondary | ICD-10-CM | POA: Diagnosis not present

## 2021-09-28 DIAGNOSIS — M79662 Pain in left lower leg: Secondary | ICD-10-CM | POA: Insufficient documentation

## 2021-09-28 MED ORDER — DICLOFENAC SODIUM 1 % EX GEL: 4.0000 g | Freq: Four times a day (QID) | CUTANEOUS | 0 refills | Status: AC

## 2021-09-28 MED ORDER — ACETAMINOPHEN 500 MG PO TABS
1000.0000 mg | ORAL_TABLET | Freq: Once | ORAL | Status: AC
  Administered 2021-09-28: 1000 mg via ORAL
  Filled 2021-09-28: qty 2

## 2021-09-28 NOTE — Discharge Instructions (Addendum)
Use the gel as prescribed Also take tylenol 1000mg(2 extra strength) four times a day.    

## 2021-09-28 NOTE — ED Triage Notes (Signed)
Patient said she slipped out of her recliner and hurt her left leg and hip. Patient did not hit her head.

## 2021-09-28 NOTE — ED Notes (Signed)
Pt ambulatory with walker with minimal assistance. She endorses some pain to left hip and leg with walking.

## 2021-09-28 NOTE — ED Provider Triage Note (Signed)
Emergency Medicine Provider Triage Evaluation Note  KJERSTEN ORMISTON , a 88 y.o. female  was evaluated in triage.  Pt complains of left hip pain after slipping out of her recliner. No head injury or LOC. She is not currently on any blood thinners. Son and daughter in law are at bedside  Review of Systems  Positive: Hip pain Negative: Low back pain  Physical Exam  BP (!) 129/51 (BP Location: Left Arm)    Pulse 80    Temp (!) 97.4 F (36.3 C) (Oral)    Resp 20    Ht 5\' 3"  (1.6 m)    Wt 70 kg    SpO2 95%    BMI 27.34 kg/m  Gen:   Awake, no distress   Resp:  Normal effort  MSK:   Moves extremities without difficulty  Other:  Some tenderness throughout left hip. No lumbar midline tenderness  Medical Decision Making  Medically screening exam initiated at 7:17 PM.  Appropriate orders placed.  LIOR CARTELLI was informed that the remainder of the evaluation will be completed by another provider, this initial triage assessment does not replace that evaluation, and the importance of remaining in the ED until their evaluation is complete.  Left hip x-ray   Suzy Bouchard, PA-C 09/28/21 1918

## 2021-09-28 NOTE — ED Provider Notes (Signed)
[facility] DEPT Provider Note   CSN: [id number] Arrival date & time: 09/28/21  1853     History  Chief Complaint  Patient presents with   Hip Pain   Leg Pain    Michelle Jones is a 88 y.o. female.  88 yo F with a chief complaints of 2 falls.  She fell yesterday when she slid out of a chair and then she slid out of a chair again today.  She tells me thinks that she may have she falls asleep when she falls.  Further history was obtained by the son who thinks that she has been hallucinating.  Says that this is not a new problem for her.  Sounds like that is been off and on initially was thought to be due to recurrent urinary tract infections but has had a few events more recently that were thought not to be related to a UTI.  She has been complaining of left hip pain and was having some trouble ambulating at home and so was brought in for evaluation.   Hip Pain  Leg Pain     Home Medications Prior to Admission medications   Medication Sig Start Date End Date Taking? Authorizing Provider  diclofenac Sodium (VOLTAREN) 1 % GEL Apply 4 g topically 4 (four) times daily. 09/28/21  Yes Deno Etienne, DO  amLODipine (NORVASC) 10 MG tablet Take 10 mg by mouth daily.    [provider]  buPROPion (WELLBUTRIN XL) 300 MG 24 hr tablet Take one tablet by mouth once daily for depression 10/03/14   [provider]  Coenzyme Q10 (CO Q 10) 100 MG CAPS Take 100 mg by mouth daily.    [provider]  DULoxetine (CYMBALTA) 60 MG capsule Take one capsule by mouth daily for depression 10/03/14   [provider]  ferrous gluconate (FERGON) 225 (27 Fe) MG tablet Take 240 mg by mouth every morning.    [provider]  furosemide (LASIX) 20 MG tablet Take 20-40 mg by mouth daily as needed for fluid.     [provider]  hyoscyamine (LEVSIN, ANASPAZ) 0.125 MG tablet Take 0.125-0.25 mg by mouth every 4 (four) hours as needed for  cramping.    [provider]  levothyroxine (SYNTHROID, LEVOTHROID) 100 MCG tablet Take one tablet by mouth every morning before breakfast for hypothyroidism 10/22/14   [provider]  lisinopril-hydrochlorothiazide (PRINZIDE,ZESTORETIC) 20-25 MG tablet Take 1 tablet by mouth daily.    [provider]  oxybutynin (DITROPAN) 5 MG tablet Take 5 mg by mouth every 8 (eight) hours as needed for bladder spasms.    [provider]  rosuvastatin (CRESTOR) 10 MG tablet Take 10 mg by mouth daily.    [provider]  tamsulosin (FLOMAX) 0.4 MG CAPS capsule Take 1 capsule (0.4 mg total) by mouth daily after supper. 09/21/18   Modena Jansky, MD      Allergies    Penicillins    Review of Systems   Review of Systems  Physical Exam Updated Vital Signs BP (!) 129/51 (BP Location: Left Arm)    Pulse 80    Temp (!) 97.4 F (36.3 C) (Oral)    Resp 20    Ht 5\' 3"  (1.6 m)    Wt 70 kg    SpO2 95%    BMI 27.34 kg/m  Physical Exam Vitals and nursing note reviewed.  Constitutional:      General: She is not in acute distress.  Appearance: She is well-developed. She is not diaphoretic.  HENT:     Head: Normocephalic and atraumatic.  Eyes:     Pupils: Pupils are equal, round, and reactive to light.  Cardiovascular:     Rate and Rhythm: Normal rate and regular rhythm.     Heart sounds: No murmur heard.   No friction rub. No gallop.  Pulmonary:     Effort: Pulmonary effort is normal.     Breath sounds: No wheezing or rales.  Abdominal:     General: There is no distension.     Palpations: Abdomen is soft.     Tenderness: There is no abdominal tenderness.  Musculoskeletal:        General: Tenderness present.     Cervical back: Normal range of motion and neck supple.     Comments: She points to the left inguinal area as area of pain.  She has full range of motion of the left lower extremity without obvious tenderness.  No pain with compression of the pelvis.   No obvious midline tenderness step-offs or deformities of the L-spine.  Skin:    General: Skin is warm and dry.  Neurological:     Mental Status: She is alert and oriented to person, place, and time.  Psychiatric:        Behavior: Behavior normal.    ED Results / Procedures / Treatments   Labs (all labs ordered are listed, but only abnormal results are displayed) Labs Reviewed - No data to display  EKG None  Radiology DG Hip Unilat W or Wo Pelvis 2-3 Views Left  Result Date: 09/28/2021 CLINICAL DATA:  Recent fall with left leg pain, initial encounter EXAM: DG HIP (WITH OR WITHOUT PELVIS) 2-3V LEFT COMPARISON:  09/14/2014 FINDINGS: Pelvic ring is intact. Mild degenerative changes of the hip joints are noted bilaterally. No acute fracture or dislocation is noted. No soft tissue abnormality is seen. Degenerative changes of lumbar spine are noted as well. IMPRESSION: Degenerative change without acute abnormality Electronically Signed   By: Inez Catalina M.D.   On: 09/28/2021 19:46    Procedures Procedures    Medications Ordered in ED Medications  acetaminophen (TYLENOL) tablet 1,000 mg (1,000 mg Oral Given 09/28/21 2129)    ED Course/ Medical Decision Making/ A&P                           Medical Decision Making Risk OTC drugs.   88 yo F with a chief complaint of left hip pain after 2 falls in 2 days.  On my exam I am able to range the hip without significant tenderness.  No obvious back discomfort on exam.  Was reportedly not able to ambulate well at home.  We will attempt to ambulate here.  Plain film independently interpreted by me of the left hip without fracture or dislocation.  No obvious fracture of the pelvis.  Patient was able to walk here without any significant assistance.  Will discharge home.  PCP follow-up.  9:56 PM:  I have discussed the diagnosis/risks/treatment options with the patient and family.  Evaluation and diagnostic testing in the emergency department  does not suggest an emergent condition requiring admission or immediate intervention beyond what has been performed at this time.  They will follow up with  PCP. We also discussed returning to the ED immediately if new or worsening sx occur. We discussed the sx which are most concerning (e.g., sudden worsening pain,  fever, inability to tolerate by mouth) that necessitate immediate return. Medications administered to the patient during their visit and any new prescriptions provided to the patient are listed below.  Medications given during this visit Medications  acetaminophen (TYLENOL) tablet 1,000 mg (1,000 mg Oral Given 09/28/21 2129)     The patient appears reasonably screen and/or stabilized for discharge and I doubt any other medical condition or other [facility] requiring further screening, evaluation, or treatment in the ED at this time prior to discharge.         Final Clinical Impression(s) / ED Diagnoses Final diagnoses:  Left hip pain    Rx / DC Orders ED Discharge Orders          Ordered    diclofenac Sodium (VOLTAREN) 1 % GEL  4 times daily        09/28/21 2155              Deno Etienne, DO 09/28/21 2156

## 2021-10-03 DIAGNOSIS — N1832 Chronic kidney disease, stage 3b: Secondary | ICD-10-CM | POA: Diagnosis not present

## 2021-10-10 DIAGNOSIS — M25551 Pain in right hip: Secondary | ICD-10-CM | POA: Diagnosis not present

## 2021-10-10 DIAGNOSIS — N390 Urinary tract infection, site not specified: Secondary | ICD-10-CM | POA: Diagnosis not present

## 2021-10-10 DIAGNOSIS — R109 Unspecified abdominal pain: Secondary | ICD-10-CM | POA: Diagnosis not present

## 2021-10-10 DIAGNOSIS — M25552 Pain in left hip: Secondary | ICD-10-CM | POA: Diagnosis not present

## 2021-10-10 DIAGNOSIS — I10 Essential (primary) hypertension: Secondary | ICD-10-CM | POA: Diagnosis not present

## 2021-10-10 DIAGNOSIS — M545 Low back pain, unspecified: Secondary | ICD-10-CM | POA: Diagnosis not present

## 2021-10-10 DIAGNOSIS — I129 Hypertensive chronic kidney disease with stage 1 through stage 4 chronic kidney disease, or unspecified chronic kidney disease: Secondary | ICD-10-CM | POA: Diagnosis not present

## 2021-10-11 ENCOUNTER — Other Ambulatory Visit: Payer: Self-pay

## 2021-10-11 ENCOUNTER — Ambulatory Visit
Admission: RE | Admit: 2021-10-11 | Discharge: 2021-10-11 | Disposition: A | Discharge status: Home / Self Care | Payer: Medicare Other | Source: Ambulatory Visit | Attending: Family Medicine | Admitting: Family Medicine

## 2021-10-11 ENCOUNTER — Other Ambulatory Visit: Payer: Self-pay | Admitting: Family Medicine

## 2021-10-11 DIAGNOSIS — M545 Low back pain, unspecified: Secondary | ICD-10-CM | POA: Diagnosis not present

## 2021-10-11 DIAGNOSIS — M25552 Pain in left hip: Secondary | ICD-10-CM

## 2021-10-11 DIAGNOSIS — R296 Repeated falls: Secondary | ICD-10-CM | POA: Diagnosis not present

## 2021-10-30 DIAGNOSIS — H04123 Dry eye syndrome of bilateral lacrimal glands: Secondary | ICD-10-CM | POA: Diagnosis not present

## 2021-10-30 DIAGNOSIS — H18593 Other hereditary corneal dystrophies, bilateral: Secondary | ICD-10-CM | POA: Diagnosis not present

## 2021-10-30 DIAGNOSIS — Z961 Presence of intraocular lens: Secondary | ICD-10-CM | POA: Diagnosis not present

## 2021-10-30 DIAGNOSIS — H43813 Vitreous degeneration, bilateral: Secondary | ICD-10-CM | POA: Diagnosis not present

## 2021-11-05 DIAGNOSIS — N3020 Other chronic cystitis without hematuria: Secondary | ICD-10-CM | POA: Diagnosis not present

## 2021-11-05 DIAGNOSIS — M7062 Trochanteric bursitis, left hip: Secondary | ICD-10-CM | POA: Diagnosis not present

## 2021-11-05 DIAGNOSIS — M7061 Trochanteric bursitis, right hip: Secondary | ICD-10-CM | POA: Diagnosis not present

## 2021-11-05 DIAGNOSIS — M545 Low back pain, unspecified: Secondary | ICD-10-CM | POA: Diagnosis not present

## 2021-11-20 DIAGNOSIS — Z20822 Contact with and (suspected) exposure to covid-19: Secondary | ICD-10-CM | POA: Diagnosis not present

## 2021-12-03 DIAGNOSIS — R350 Frequency of micturition: Secondary | ICD-10-CM | POA: Diagnosis not present

## 2021-12-03 DIAGNOSIS — N390 Urinary tract infection, site not specified: Secondary | ICD-10-CM | POA: Diagnosis not present

## 2021-12-03 DIAGNOSIS — R300 Dysuria: Secondary | ICD-10-CM | POA: Diagnosis not present

## 2021-12-03 DIAGNOSIS — R399 Unspecified symptoms and signs involving the genitourinary system: Secondary | ICD-10-CM | POA: Diagnosis not present

## 2021-12-11 DIAGNOSIS — H353132 Nonexudative age-related macular degeneration, bilateral, intermediate dry stage: Secondary | ICD-10-CM | POA: Diagnosis not present

## 2021-12-14 DIAGNOSIS — Z111 Encounter for screening for respiratory tuberculosis: Secondary | ICD-10-CM | POA: Diagnosis not present

## 2021-12-14 DIAGNOSIS — N1832 Chronic kidney disease, stage 3b: Secondary | ICD-10-CM | POA: Diagnosis not present

## 2021-12-14 DIAGNOSIS — N3020 Other chronic cystitis without hematuria: Secondary | ICD-10-CM | POA: Diagnosis not present

## 2021-12-18 ENCOUNTER — Encounter: Payer: Self-pay | Admitting: Physician Assistant

## 2021-12-18 ENCOUNTER — Other Ambulatory Visit: Payer: Self-pay

## 2021-12-18 ENCOUNTER — Emergency Department (HOSPITAL_BASED_OUTPATIENT_CLINIC_OR_DEPARTMENT_OTHER)
Admission: EM | Admit: 2021-12-18 | Discharge: 2021-12-18 | Disposition: A | Discharge status: Home / Self Care | Payer: Medicare Other | Attending: Emergency Medicine | Admitting: Emergency Medicine

## 2021-12-18 ENCOUNTER — Encounter (HOSPITAL_BASED_OUTPATIENT_CLINIC_OR_DEPARTMENT_OTHER): Payer: Self-pay | Admitting: Emergency Medicine

## 2021-12-18 ENCOUNTER — Emergency Department (HOSPITAL_BASED_OUTPATIENT_CLINIC_OR_DEPARTMENT_OTHER): Payer: Medicare Other

## 2021-12-18 DIAGNOSIS — F0390 Unspecified dementia without behavioral disturbance: Secondary | ICD-10-CM | POA: Diagnosis not present

## 2021-12-18 DIAGNOSIS — S0003XA Contusion of scalp, initial encounter: Secondary | ICD-10-CM | POA: Diagnosis not present

## 2021-12-18 DIAGNOSIS — I10 Essential (primary) hypertension: Secondary | ICD-10-CM | POA: Insufficient documentation

## 2021-12-18 DIAGNOSIS — R410 Disorientation, unspecified: Secondary | ICD-10-CM

## 2021-12-18 DIAGNOSIS — W19XXXA Unspecified fall, initial encounter: Secondary | ICD-10-CM | POA: Insufficient documentation

## 2021-12-18 DIAGNOSIS — Z79899 Other long term (current) drug therapy: Secondary | ICD-10-CM | POA: Diagnosis not present

## 2021-12-18 DIAGNOSIS — S0990XA Unspecified injury of head, initial encounter: Secondary | ICD-10-CM | POA: Diagnosis not present

## 2021-12-18 DIAGNOSIS — S060X0A Concussion without loss of consciousness, initial encounter: Secondary | ICD-10-CM | POA: Diagnosis not present

## 2021-12-18 LAB — COMPREHENSIVE METABOLIC PANEL
ALT: 16 U/L (ref 0–44)
AST: 18 U/L (ref 15–41)
Albumin: 3.7 g/dL (ref 3.5–5.0)
Alkaline Phosphatase: 37 U/L — ABNORMAL LOW (ref 38–126)
Anion gap: 9 (ref 5–15)
BUN: 33 mg/dL — ABNORMAL HIGH (ref 8–23)
CO2: 27 mmol/L (ref 22–32)
Calcium: 9.3 mg/dL (ref 8.9–10.3)
Chloride: 106 mmol/L (ref 98–111)
Creatinine, Ser: 1.2 mg/dL — ABNORMAL HIGH (ref 0.44–1.00)
GFR, Estimated: 43 mL/min — ABNORMAL LOW (ref >60)
Glucose, Bld: 91 mg/dL (ref 70–99)
Potassium: 3.8 mmol/L (ref 3.5–5.1)
Sodium: 142 mmol/L (ref 135–145)
Total Bilirubin: 0.4 mg/dL (ref 0.3–1.2)
Total Protein: 6.4 g/dL — ABNORMAL LOW (ref 6.5–8.1)

## 2021-12-18 LAB — CBC WITH DIFFERENTIAL/PLATELET
Abs Immature Granulocytes: 0.04 10*3/uL (ref 0.00–0.07)
Basophils Absolute: 0.1 10*3/uL (ref 0.0–0.1)
Basophils Relative: 1 %
Eosinophils Absolute: 0.2 10*3/uL (ref 0.0–0.5)
Eosinophils Relative: 3 %
HCT: 35.9 % — ABNORMAL LOW (ref 36.0–46.0)
Hemoglobin: 11.4 g/dL — ABNORMAL LOW (ref 12.0–15.0)
Immature Granulocytes: 1 %
Lymphocytes Relative: 25 %
Lymphs Abs: 1.7 10*3/uL (ref 0.7–4.0)
MCH: 32.2 pg (ref 26.0–34.0)
MCHC: 31.8 g/dL (ref 30.0–36.0)
MCV: 101.4 fL — ABNORMAL HIGH (ref 80.0–100.0)
Monocytes Absolute: 0.8 10*3/uL (ref 0.1–1.0)
Monocytes Relative: 11 %
Neutro Abs: 4.3 10*3/uL (ref 1.7–7.7)
Neutrophils Relative %: 59 %
Platelets: 192 10*3/uL (ref 150–400)
RBC: 3.54 MIL/uL — ABNORMAL LOW (ref 3.87–5.11)
RDW: 12.6 % (ref 11.5–15.5)
WBC: 7.1 10*3/uL (ref 4.0–10.5)
nRBC: 0 % (ref 0.0–0.2)

## 2021-12-18 LAB — URINALYSIS, MICROSCOPIC (REFLEX)

## 2021-12-18 LAB — URINALYSIS, ROUTINE W REFLEX MICROSCOPIC
Bilirubin Urine: NEGATIVE
Glucose, UA: NEGATIVE mg/dL
Hgb urine dipstick: NEGATIVE
Ketones, ur: NEGATIVE mg/dL
Nitrite: NEGATIVE
Protein, ur: NEGATIVE mg/dL
Specific Gravity, Urine: 1.02 (ref 1.005–1.030)
pH: 6 (ref 5.0–8.0)

## 2021-12-18 NOTE — ED Notes (Signed)
Patient verbalizes understanding of discharge instructions. Opportunity for questioning and answers were provided. Patient discharged from ED.  °

## 2021-12-18 NOTE — ED Provider Notes (Addendum)
[facility] EMERGENCY DEPT Provider Note   CSN: [id number] Arrival date & time: 12/18/21  2725     History  Chief Complaint  Patient presents with   Ria Comment is a 88 y.o. female.  Patient had a fall Thursday at home.  Does live with family members.  She fell backwards striking the back of her head.  No loss of consciousness.  She has a bruise on the back of her head.  No nausea no vomiting.  But family states at times she has had some confusion it seems to be worse since the fall.  Patient is transitioning to assisted living.  And had some lab work done on Friday.  Which include a urinalysis which so far has been negative but family states that when she has had urinary tract infections she will act like this sometimes.  And they are also concerned about the fall.  She has been eating good no nausea no vomiting patient says she has a persistent headache.  No neck pain.  No hip pain no leg pain no other injuries.  Patient normally walks with a walker.  And has been getting around as usual.  Past medical history significant for patient not being on any blood thinners.  History significant for hypertension gastroesophageal reflux disease and it does sound like that there has been some early dementia some hallucinations.  Family states that they have been increased but they are similar to what is been going on prior to the fall.      Home Medications Prior to Admission medications   Medication Sig Start Date End Date Taking? Authorizing Provider  amLODipine (NORVASC) 10 MG tablet Take 10 mg by mouth daily.    [provider]  buPROPion (WELLBUTRIN XL) 300 MG 24 hr tablet Take one tablet by mouth once daily for depression 10/03/14   [provider]  Coenzyme Q10 (CO Q 10) 100 MG CAPS Take 100 mg by mouth daily.    [provider]  diclofenac Sodium (VOLTAREN) 1 % GEL Apply 4 g topically 4 (four) times daily. 09/28/21   Melene Plan, DO   DULoxetine (CYMBALTA) 60 MG capsule Take one capsule by mouth daily for depression 10/03/14   [provider]  ferrous gluconate (FERGON) 225 (27 Fe) MG tablet Take 240 mg by mouth every morning.    [provider]  furosemide (LASIX) 20 MG tablet Take 20-40 mg by mouth daily as needed for fluid.     [provider]  hyoscyamine (LEVSIN, ANASPAZ) 0.125 MG tablet Take 0.125-0.25 mg by mouth every 4 (four) hours as needed for cramping.    [provider]  levothyroxine (SYNTHROID, LEVOTHROID) 100 MCG tablet Take one tablet by mouth every morning before breakfast for hypothyroidism 10/22/14   [provider]  lisinopril-hydrochlorothiazide (PRINZIDE,ZESTORETIC) 20-25 MG tablet Take 1 tablet by mouth daily.    [provider]  oxybutynin (DITROPAN) 5 MG tablet Take 5 mg by mouth every 8 (eight) hours as needed for bladder spasms.    [provider]  rosuvastatin (CRESTOR) 10 MG tablet Take 10 mg by mouth daily.    [provider]  tamsulosin (FLOMAX) 0.4 MG CAPS capsule Take 1 capsule (0.4 mg total) by mouth daily after supper. 09/21/18   Hongalgi, Maximino Greenland, MD      Allergies    Penicillins    Review of Systems   Review of Systems  Constitutional:  Negative for chills and  fever.  HENT:  Negative for ear pain and sore throat.   Eyes:  Negative for pain and visual disturbance.  Respiratory:  Negative for cough and shortness of breath.   Cardiovascular:  Negative for chest pain and palpitations.  Gastrointestinal:  Negative for abdominal pain and vomiting.  Genitourinary:  Negative for dysuria and hematuria.  Musculoskeletal:  Negative for arthralgias and back pain.  Skin:  Negative for color change and rash.  Neurological:  Positive for headaches. Negative for seizures and syncope.  Psychiatric/Behavioral:  Positive for confusion.   All other systems reviewed and are negative.  Physical Exam Updated Vital Signs BP (!)  127/47   Pulse 67   Temp 98.3 F (36.8 C) (Oral)   Resp 19   Ht 1.6 m (5\' 3" )   Wt 70 kg   SpO2 96%   BMI 27.34 kg/m  Physical Exam Vitals and nursing note reviewed.  Constitutional:      General: She is not in acute distress.    Appearance: She is well-developed.  HENT:     Head: Normocephalic.     Comments: Top of the occiput crown of the skull area.  With an area of bruising measuring about 5 cm.  With probable slight hematoma.  No step-off. Eyes:     Extraocular Movements: Extraocular movements intact.     Conjunctiva/sclera: Conjunctivae normal.     Pupils: Pupils are equal, round, and reactive to light.  Cardiovascular:     Rate and Rhythm: Normal rate and regular rhythm.     Heart sounds: No murmur heard. Pulmonary:     Effort: Pulmonary effort is normal. No respiratory distress.     Breath sounds: Normal breath sounds.  Abdominal:     Palpations: Abdomen is soft.     Tenderness: There is no abdominal tenderness.  Musculoskeletal:        General: No swelling.     Cervical back: Normal range of motion and neck supple. No rigidity or tenderness.  Skin:    General: Skin is warm and dry.     Capillary Refill: Capillary refill takes less than 2 seconds.  Neurological:     General: No focal deficit present.     Mental Status: She is alert. Mental status is at baseline.     Cranial Nerves: No cranial nerve deficit.     Sensory: No sensory deficit.     Motor: No weakness.     Comments: Patient very engaged with conversations no apparent confusion.  No focal neurodeficit.  Psychiatric:        Mood and Affect: Mood normal.    ED Results / Procedures / Treatments   Labs (all labs ordered are listed, but only abnormal results are displayed) Labs Reviewed  CBC WITH DIFFERENTIAL/PLATELET - Abnormal; Notable for the following components:      Result Value   RBC 3.54 (*)    Hemoglobin 11.4 (*)    HCT 35.9 (*)    MCV 101.4 (*)    All other components within normal  limits  COMPREHENSIVE METABOLIC PANEL - Abnormal; Notable for the following components:   BUN 33 (*)    Creatinine, Ser 1.20 (*)    Total Protein 6.4 (*)    Alkaline Phosphatase 37 (*)    GFR, Estimated 43 (*)    All other components within normal limits  URINALYSIS, ROUTINE W REFLEX MICROSCOPIC - Abnormal; Notable for the following components:   Color, Urine STRAW (*)    Leukocytes,Ua TRACE (*)  All other components within normal limits  URINALYSIS, MICROSCOPIC (REFLEX) - Abnormal; Notable for the following components:   Bacteria, UA FEW (*)    Non Squamous Epithelial PRESENT (*)    All other components within normal limits    EKG EKG Interpretation  Date/Time:  Tuesday Dec 18 2021 08:30:39 EDT Ventricular Rate:  69 PR Interval:  178 QRS Duration: 96 QT Interval:  418 QTC Calculation: 448 R Axis:   2 Text Interpretation: Sinus rhythm Confirmed by Vanetta Mulders [phone]) on 12/18/2021 9:22:10 AM  Radiology CT Head Wo Contrast  Result Date: 12/18/2021 CLINICAL DATA:  Provided history: Head trauma, minor. EXAM: CT HEAD WITHOUT CONTRAST TECHNIQUE: Contiguous axial images were obtained from the base of the skull through the vertex without intravenous contrast. RADIATION DOSE REDUCTION: This exam was performed according to the departmental dose-optimization program which includes automated exposure control, adjustment of the mA and/or kV according to patient size and/or use of iterative reconstruction technique. COMPARISON:  No pertinent prior exams available for comparison. FINDINGS: Brain: Moderate cerebral atrophy. Comparatively mild cerebellar atrophy. There is no acute intracranial hemorrhage. No demarcated cortical infarct. No extra-axial fluid collection. No evidence of an intracranial mass. No midline shift. Vascular: No hyperdense vessel.  Atherosclerotic calcifications. Skull: Normal. Negative for fracture or focal lesion. Sinuses/Orbits: No mass or acute finding within the  imaged orbits. No significant paranasal sinus disease at the imaged levels. Other: Small right mastoid effusion. IMPRESSION: No evidence of acute intracranial abnormality. Moderate cerebral atrophy. Comparatively mild cerebellar atrophy. Small right mastoid effusion. Electronically Signed   By: Jackey Loge D.O.   On: 12/18/2021 09:21    Procedures Procedures    Medications Ordered in ED Medications - No data to display  ED Course/ Medical Decision Making/ A&P                           Medical Decision Making Amount and/or Complexity of Data Reviewed Labs: ordered. Radiology: ordered.   CT head negative for any traumatic event which is reassuring.  But based on the family's concerns we will do lab screening with a CBC complete metabolic panel and a urinalysis.  Patient's EKG without any significant arrhythmias.  Or any acute findings.  Patient's urinalysis negative for urinary tract infection.  Urine a little concentrated.  Complete metabolic panel does show some renal insufficiency with a GFR 43 no significant electrolyte abnormalities.  CBC no leukocytosis hemoglobin 11.4.  Overall patient appears very nontoxic no acute distress.  Patient stable for discharge home and follow-up with primary care doctor.  Patient very well could have a concussion and these could be concussive symptoms.  So will be given referral to neurology as needed.   Final Clinical Impression(s) / ED Diagnoses Final diagnoses:  Fall, initial encounter  Scalp hematoma, initial encounter  Confusion  Concussion without loss of consciousness, initial encounter    Rx / DC Orders ED Discharge Orders     None         Vanetta Mulders, MD 12/18/21 1023    Vanetta Mulders, MD 12/18/21 1123

## 2021-12-18 NOTE — Discharge Instructions (Addendum)
Work-up here today without any acute findings.  Urine is normal.  Recommend follow back up with primary care doctor.  Very possibly that she could have a concussion related to the fall.  Also given referral to neurology as needed but I would recommend seeing your regular doctor first. ?

## 2021-12-18 NOTE — ED Triage Notes (Signed)
Pt arrives to ED with c/o fall. Per family pt fell backwards on 5/4 hitting her posterior head. Per family pt has had intermittent confusion over the past 3 days including visual hallucinations.   ?

## 2021-12-24 DIAGNOSIS — Z9181 History of falling: Secondary | ICD-10-CM | POA: Diagnosis not present

## 2021-12-24 DIAGNOSIS — M5136 Other intervertebral disc degeneration, lumbar region: Secondary | ICD-10-CM | POA: Diagnosis not present

## 2021-12-24 DIAGNOSIS — R32 Unspecified urinary incontinence: Secondary | ICD-10-CM | POA: Diagnosis not present

## 2021-12-24 DIAGNOSIS — M7062 Trochanteric bursitis, left hip: Secondary | ICD-10-CM | POA: Diagnosis not present

## 2021-12-24 DIAGNOSIS — M7061 Trochanteric bursitis, right hip: Secondary | ICD-10-CM | POA: Diagnosis not present

## 2021-12-26 ENCOUNTER — Encounter: Payer: Self-pay | Admitting: Physician Assistant

## 2021-12-26 ENCOUNTER — Other Ambulatory Visit (INDEPENDENT_AMBULATORY_CARE_PROVIDER_SITE_OTHER): Payer: Medicare Other

## 2021-12-26 ENCOUNTER — Ambulatory Visit (INDEPENDENT_AMBULATORY_CARE_PROVIDER_SITE_OTHER): Payer: Medicare Other | Admitting: Physician Assistant

## 2021-12-26 VITALS — BP 135/76 | HR 68 | Resp 18 | Ht 63.0 in | Wt 143.0 lb

## 2021-12-26 DIAGNOSIS — G309 Alzheimer's disease, unspecified: Secondary | ICD-10-CM

## 2021-12-26 DIAGNOSIS — F0280 Dementia in other diseases classified elsewhere without behavioral disturbance: Secondary | ICD-10-CM

## 2021-12-26 DIAGNOSIS — R413 Other amnesia: Secondary | ICD-10-CM

## 2021-12-26 LAB — VITAMIN B12: Vitamin B-12: 396 pg/mL (ref 211–911)

## 2021-12-26 LAB — TSH: TSH: 3.04 u[IU]/mL (ref 0.35–5.50)

## 2021-12-26 MED ORDER — MEMANTINE HCL 5 MG PO TABS: ORAL_TABLET | ORAL | 11 refills | Status: DC

## 2021-12-26 NOTE — Patient Instructions (Addendum)
It was a pleasure to see you today at our office.  ? ?Recommendations: ? ?Follow up in 3 months ? Start Memantine 5 mg: Take 1 tablet ('5mg'$  at night) for 2 weeks, then increase to 1 tablet ('5mg'$ ) twice a day.    ?Check lbs today  ? ?Whom to call: ? ?Memory  decline, memory medications: Call out office [phone]  ? ?For psychiatric meds, mood meds: Please have your primary care physician manage these medications.  ? ?Counseling regarding caregiver distress, including caregiver depression, anxiety and issues regarding community resources, adult day care programs, adult living facilities, or memory care questions:   Feel free to contact [place], Social Worker at [phone] ?  ?For assessment of decision of mental capacity and competency:  Call Dr. Anthoney Harada, geriatric psychiatrist at [phone] ? ? ? ?If you have any severe symptoms of a stroke, or other severe issues such as confusion,severe chills or fever, etc call 911 or go to the ER as you may need to be evaluate further ? ? ?Feel free to visit Facebook page " [employer]" for tips of how to care for people with memory problems.  ? ? ? ? ?RECOMMENDATIONS FOR ALL PATIENTS WITH MEMORY PROBLEMS: ?1. Continue to exercise (Recommend 30 minutes of walking everyday, or 3 hours every week) ?2. Increase social interactions - continue going to [place] and enjoy social gatherings with friends and family ?3. Eat healthy, avoid fried foods and eat more fruits and vegetables ?4. Maintain adequate blood pressure, blood sugar, and blood cholesterol level. Reducing the risk of stroke and cardiovascular disease also helps promoting better memory. ?5. Avoid stressful situations. Live a simple life and avoid aggravations. Organize your time and prepare for the next day in anticipation. ?6. Sleep well, avoid any interruptions of sleep and avoid any distractions in the bedroom that may interfere with adequate sleep quality ?7. Avoid sugar, avoid sweets as there is a  strong link between excessive sugar intake, diabetes, and cognitive impairment ?We discussed the Mediterranean diet, which has been shown to help patients reduce the risk of progressive memory disorders and reduces cardiovascular risk. This includes eating fish, eat fruits and green leafy vegetables, nuts like almonds and hazelnuts, walnuts, and also use olive oil. Avoid fast foods and fried foods as much as possible. Avoid sweets and sugar as sugar use has been linked to worsening of memory function. ? ?There is always a concern of gradual progression of memory problems. If this is the case, then we may need to adjust level of care according to patient needs. Support, both to the patient and caregiver, should then be put into place.  ? ? ?The Alzheimer?s Association is here all day, every day for people facing Alzheimer?s disease through our free 24/7 Helpline: [phone]. The Helpline provides reliable information and support to all those who need assistance, such as individuals living with memory loss, Alzheimer's or other dementia, caregivers, health care professionals and the public.  ?Our highly trained and knowledgeable staff can help you with: ?Understanding memory loss, dementia and Alzheimer's  ?Medications and other treatment options  ?General information about aging and brain health  ?Skills to provide quality care and to find the best care from professionals  ?Legal, financial and living-arrangement decisions ?Our Helpline also features: ?Confidential care consultation provided by master's level clinicians who can help with decision-making support, crisis assistance and education on issues families face every day  ?Help in a caller's preferred language using our translation service that  features more than 200 languages and dialects  ?Referrals to local community programs, services and ongoing support ? ? ? ? ?FALL PRECAUTIONS: Be cautious when walking. Scan the area for obstacles that may increase the  risk of trips and falls. When getting up in the mornings, sit up at the edge of the bed for a few minutes before getting out of bed. Consider elevating the bed at the head end to avoid drop of blood pressure when getting up. Walk always in a well-lit room (use night lights in the walls). Avoid area rugs or power cords from appliances in the middle of the walkways. Use a walker or a cane if necessary and consider physical therapy for balance exercise. Get your eyesight checked regularly. ? ?FINANCIAL OVERSIGHT: Supervision, especially oversight when making financial decisions or transactions is also recommended. ? ?HOME SAFETY: Consider the safety of the kitchen when operating appliances like stoves, microwave oven, and blender. Consider having supervision and share cooking responsibilities until no longer able to participate in those. Accidents with firearms and other hazards in the house should be identified and addressed as well. ? ? ?ABILITY TO BE LEFT ALONE: If patient is unable to contact 911 operator, consider using LifeLine, or when the need is there, arrange for someone to stay with patients. Smoking is a fire hazard, consider supervision or cessation. Risk of wandering should be assessed by caregiver and if detected at any point, supervision and safe proof recommendations should be instituted. ? ?MEDICATION SUPERVISION: Inability to self-administer medication needs to be constantly addressed. Implement a mechanism to ensure safe administration of the medications. ? ? ? ? ?Mediterranean Diet ?A Mediterranean diet refers to food and lifestyle choices that are based on the traditions of countries located on the [employer]. This way of eating has been shown to help prevent certain conditions and improve outcomes for people who have chronic diseases, like kidney disease and heart disease. ?What are tips for following this plan? ?Lifestyle  ?Cook and eat meals together with your family, when  possible. ?Drink enough fluid to keep your urine clear or pale yellow. ?Be physically active every day. This includes: ?Aerobic exercise like running or swimming. ?Leisure activities like gardening, walking, or housework. ?Get 7-8 hours of sleep each night. ?If recommended by your health care provider, drink red wine in moderation. This means 1 glass a day for nonpregnant women and 2 glasses a day for men. A glass of wine equals 5 oz (150 mL). ?Reading food labels  ?Check the serving size of packaged foods. For foods such as rice and pasta, the serving size refers to the amount of cooked product, not dry. ?Check the total fat in packaged foods. Avoid foods that have saturated fat or trans fats. ?Check the ingredients list for added sugars, such as corn syrup. ?Shopping  ?At the grocery store, buy most of your food from the areas near the walls of the store. This includes: ?Fresh fruits and vegetables (produce). ?Grains, beans, nuts, and seeds. Some of these may be available in unpackaged forms or large amounts (in bulk). ?Fresh seafood. ?Poultry and eggs. ?Low-fat dairy products. ?Buy whole ingredients instead of prepackaged foods. ?Buy fresh fruits and vegetables in-season from local farmers markets. ?Buy frozen fruits and vegetables in resealable bags. ?If you do not have access to quality fresh seafood, buy precooked frozen shrimp or canned fish, such as tuna, salmon, or sardines. ?Buy small amounts of raw or cooked vegetables, salads, or olives from the deli or  salad bar at your store. ?Stock your pantry so you always have certain foods on hand, such as olive oil, canned tuna, canned tomatoes, rice, pasta, and beans. ?Cooking  ?Cook foods with extra-virgin olive oil instead of using butter or other vegetable oils. ?Have meat as a side dish, and have vegetables or grains as your main dish. This means having meat in small portions or adding small amounts of meat to foods like pasta or stew. ?Use beans or  vegetables instead of meat in common dishes like chili or lasagna. ?Experiment with different cooking methods. Try roasting or broiling vegetables instead of steaming or saut?eing them. ?Add frozen vegetables to soups, ste

## 2021-12-26 NOTE — Progress Notes (Addendum)
Assessment/Plan:   Michelle Jones is a very pleasant 88 y.o. year old RH female with  a history of hypertension, hyperlipidemia, anxiety, depression seen today for evaluation of memory loss. 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  Recent CT of the head on 12/18/2021 confirms moderate cerebral atrophy, and mild cerebellar atrophy, without any other acute findings.  She is not on antidementia medication.   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  Discussed the importance of regular daily schedule to maintain brain function.  Continue to monitor mood with PCP. Check TSH and B12 Start memantine 5 mg at night, for 2 weeks, if tolerated, may increase to 5 twice daily. Side effects discussed. Folllow up in 3 months  Subjective:    The patient is seen in neurologic consultation at the request of Fredia Sorrow, MD for the evaluation of memory.  The patient is accompanied by her son who supplements the history.  How long did patient have memory difficulties?" 2 y ago, her sister passed in 09/2019 ans she was the caregiver to her sister and then she "kind of felt lost".  It was then that her son noticed some changes in her memory.  Initially he thought it was depression, but he noted that she had progressively more issues with short-term and long-term memory.  Because she has recurrent UTIs, which appeared with confusion episodes, he first thought that it was related to infection rather than dementia. Patient lives at [facility] for next Monday:.  Until then, she is living with her son.  At home, she likes to talk with her friends on the phone, read the newspaper and watch TV and do word finding. repeats oneself? Denies  Disoriented when walking into a room?  Patient denies   Leaving  objects in unusual places?  Patient denies   Ambulates  with difficulty?   Patient denies   Recent falls?  Patient denies   Any head injuries?  The patient had a mechanical fall hitting the back of her head in the past, did not want to seek medical attention, however her son insisted, took her to the hospital, and other labs were concerning for UTI.  Apparently, she had an episode of confusion, had fallen without loss of consciousness.  History of seizures?  Patient denies   Wandering behavior?  Patient denies   Patient drives?   Patient no longer drives  Any mood changes such irritability agitation?  Patient denies   Any history of depression?:  Patient denies   Hallucinations?    especially with UTI'S, son says.  She sees her great grandkids, who are grown by now, but she sees them as children, and complains that someone left and an attendant.  She also sees both in the backyard at times.  She sees her deceased mother and sister at times prepared in lunch for her in the kitchen, and image that is very comforting to her. Paranoia?  Patient was born during the depression, says she has always been putting stuff in her pocket and pocket both "just in case ". Patient reports that he sleeps well without vivid dreams, REM behavior or sleepwalking   Any hygiene concerns?  Patient denies   Independent of bathing and dressing?  Endorsed  Does the patient needs help with medications? Pillpack ad family  reminds her  Who is in charge of the finances?  Son is in charge Any changes in appetite?  Patient denies   Patient have trouble swallowing? Patient denies   Does the patient cook?  Patient denies   Any kitchen accidents such as leaving the stove on? Patient denies   Any headaches?  Patient denies   The double vision? Patient denies   Any focal numbness or tingling?  Patient denies   Chronic back pain Patient denies   Unilateral weakness?  Patient denies   Any tremors?  Patient denies   Any history of  anosmia?  Patient denies   Any incontinence of urine? SHe wears pads at night for comfort "just in case" Any bowel dysfunction?   Patient denies      History of heavy alcohol intake?  Patient denies   History of heavy tobacco use?  Patient denies   Family history of dementia? 1 sis 96, 94, no AD  Allergies  Allergen Reactions   Penicillins     Has patient had a PCN reaction causing immediate rash, facial/tongue/throat swelling, SOB or lightheadedness with hypotension: no throat swelling or SOB Has patient had a PCN reaction causing severe rash involving mucus membranes or skin necrosis: unknown, patients states she had sores on her mouth Has patient had a PCN reaction that required hospitalization: no Has patient had a PCN reaction occurring within the last 10 years: no If all of the above answers are "NO", then may proceed with Cephalosp    Current Outpatient Medications  Medication Instructions   amLODipine (NORVASC) 10 mg, Oral, Daily   buPROPion (WELLBUTRIN XL) 300 MG 24 hr tablet Take one tablet by mouth once daily for depression   Co Q 10 100 mg, Oral, Daily   diclofenac Sodium (VOLTAREN) 4 g, Topical, 4 times daily   DULoxetine (CYMBALTA) 60 MG capsule Take one capsule by mouth daily for depression   ferrous gluconate (FERGON) 240 mg, Oral, Every morning   furosemide (LASIX) 20-40 mg, Oral, Daily PRN   hyoscyamine (LEVSIN) 0.125-0.25 mg, Oral, Every 4 hours PRN   levothyroxine (SYNTHROID, LEVOTHROID) 100 MCG tablet Take one tablet by mouth every morning before breakfast for hypothyroidism   lisinopril-hydrochlorothiazide (PRINZIDE,ZESTORETIC) 20-25 MG tablet 1 tablet, Oral, Daily   memantine (NAMENDA) 5 MG tablet . Take 1 tablet (5 mg at night) for 2 weeks, then increase to 1 tablet (5 mg) twice a day   oxybutynin (DITROPAN) 5 mg, Oral, Every 8 hours PRN   rosuvastatin (CRESTOR) 10 mg, Oral, Daily   tamsulosin (FLOMAX) 0.4 mg, Oral, Daily after supper     VITALS:   Vitals:    12/26/21 1026  BP: 135/76  Pulse: 68  Resp: 18  SpO2: 100%  Weight: 143 lb (64.9 kg)  Height: '5\' 3"'$  (1.6 m)       View : No data to display.          PHYSICAL EXAM   HEENT:  Normocephalic, atraumatic. The mucous membranes are moist. The superficial temporal arteries are without ropiness or tenderness. Cardiovascular: Regular rate and rhythm. Lungs: Clear to auscultation bilaterally. Neck: There are no carotid bruits noted bilaterally.  NEUROLOGICAL:    12/26/2021   11:00 AM  Montreal Cognitive Assessment   Visuospatial/ Executive (0/5) 0  Naming (0/3) 3  Attention: Read list of digits (0/2) 0  Attention: Read list of letters (0/1) 0  Attention: Serial 7 subtraction starting at 100 (0/3) 1  Language: Repeat phrase (0/2) 2  Language : Fluency (0/1) 0  Abstraction (0/2) 0  Delayed Recall (0/5) 2  Orientation (0/6) 6  Total 14  Adjusted Score (based on education) 14        View : No data to display.           Orientation:  Alert and oriented to person, place and time. No aphasia or dysarthria. Fund of knowledge is reduced. Recent and remote memory are impaired.  Attention and concentration are reduced.  Able to name objects and repeat phrases. Delayed recall 2/5   Cranial nerves: There is good facial symmetry. Extraocular muscles are intact and visual fields are full to confrontational testing. Speech is fluent and clear. Soft palate rises symmetrically and there is no tongue deviation. Hearing is intact to conversational tone. Tone: Tone is good throughout. Sensation: Sensation is intact to light touch and pinprick throughout. Vibration is intact at the bilateral big toe.There is no extinction with double simultaneous stimulation. There is no sensory dermatomal level identified. Coordination: The patient has no difficulty with RAM's or FNF bilaterally. Normal finger to nose  Motor: Strength is 5/5 in the bilateral upper and lower extremities. There is no pronator  drift. There are no fasciculations noted. DTR's: Deep tendon reflexes are 2/4 at the bilateral biceps, triceps, brachioradialis, patella and achilles.  Plantar responses are downgoing bilaterally. Gait and Station: The patient is able to ambulate with some difficulty, needs her walker. The patient is able to stand in the Romberg position.     Thank you for allowing [place] the opportunity to participate in the care of this nice patient. Please do not hesitate to contact us for any questions or concerns.   Total time spent on today's visit was  61 minutes, including both face-to-face time and nonface-to-face time.  Time included that spent on review of records (prior notes available to me/labs/imaging if pertinent), discussing treatment and goals, answering patient's questions and coordinating care.  Cc:  Maury Dus, MD  Sharene Butters 12/26/2021 2:27 PM

## 2021-12-27 DIAGNOSIS — M5136 Other intervertebral disc degeneration, lumbar region: Secondary | ICD-10-CM | POA: Diagnosis not present

## 2021-12-27 DIAGNOSIS — Z9181 History of falling: Secondary | ICD-10-CM | POA: Diagnosis not present

## 2021-12-27 DIAGNOSIS — M7061 Trochanteric bursitis, right hip: Secondary | ICD-10-CM | POA: Diagnosis not present

## 2021-12-27 DIAGNOSIS — R32 Unspecified urinary incontinence: Secondary | ICD-10-CM | POA: Diagnosis not present

## 2021-12-27 DIAGNOSIS — M7062 Trochanteric bursitis, left hip: Secondary | ICD-10-CM | POA: Diagnosis not present

## 2022-01-02 DIAGNOSIS — M7062 Trochanteric bursitis, left hip: Secondary | ICD-10-CM | POA: Diagnosis not present

## 2022-01-02 DIAGNOSIS — R32 Unspecified urinary incontinence: Secondary | ICD-10-CM | POA: Diagnosis not present

## 2022-01-02 DIAGNOSIS — M7061 Trochanteric bursitis, right hip: Secondary | ICD-10-CM | POA: Diagnosis not present

## 2022-01-02 DIAGNOSIS — Z9181 History of falling: Secondary | ICD-10-CM | POA: Diagnosis not present

## 2022-01-02 DIAGNOSIS — M5136 Other intervertebral disc degeneration, lumbar region: Secondary | ICD-10-CM | POA: Diagnosis not present

## 2022-01-04 DIAGNOSIS — M7062 Trochanteric bursitis, left hip: Secondary | ICD-10-CM | POA: Diagnosis not present

## 2022-01-04 DIAGNOSIS — Z9181 History of falling: Secondary | ICD-10-CM | POA: Diagnosis not present

## 2022-01-04 DIAGNOSIS — M7061 Trochanteric bursitis, right hip: Secondary | ICD-10-CM | POA: Diagnosis not present

## 2022-01-04 DIAGNOSIS — M5136 Other intervertebral disc degeneration, lumbar region: Secondary | ICD-10-CM | POA: Diagnosis not present

## 2022-01-04 DIAGNOSIS — R32 Unspecified urinary incontinence: Secondary | ICD-10-CM | POA: Diagnosis not present

## 2022-01-08 DIAGNOSIS — I129 Hypertensive chronic kidney disease with stage 1 through stage 4 chronic kidney disease, or unspecified chronic kidney disease: Secondary | ICD-10-CM | POA: Diagnosis not present

## 2022-01-08 DIAGNOSIS — E039 Hypothyroidism, unspecified: Secondary | ICD-10-CM | POA: Diagnosis not present

## 2022-01-08 DIAGNOSIS — N1831 Chronic kidney disease, stage 3a: Secondary | ICD-10-CM | POA: Diagnosis not present

## 2022-01-08 DIAGNOSIS — F0390 Unspecified dementia without behavioral disturbance: Secondary | ICD-10-CM | POA: Diagnosis not present

## 2022-01-08 DIAGNOSIS — K219 Gastro-esophageal reflux disease without esophagitis: Secondary | ICD-10-CM | POA: Diagnosis not present

## 2022-01-08 DIAGNOSIS — M7062 Trochanteric bursitis, left hip: Secondary | ICD-10-CM | POA: Diagnosis not present

## 2022-01-08 DIAGNOSIS — R32 Unspecified urinary incontinence: Secondary | ICD-10-CM | POA: Diagnosis not present

## 2022-01-08 DIAGNOSIS — M7061 Trochanteric bursitis, right hip: Secondary | ICD-10-CM | POA: Diagnosis not present

## 2022-01-08 DIAGNOSIS — F339 Major depressive disorder, recurrent, unspecified: Secondary | ICD-10-CM | POA: Diagnosis not present

## 2022-01-08 DIAGNOSIS — M5136 Other intervertebral disc degeneration, lumbar region: Secondary | ICD-10-CM | POA: Diagnosis not present

## 2022-01-08 DIAGNOSIS — E785 Hyperlipidemia, unspecified: Secondary | ICD-10-CM | POA: Diagnosis not present

## 2022-01-08 DIAGNOSIS — M159 Polyosteoarthritis, unspecified: Secondary | ICD-10-CM | POA: Diagnosis not present

## 2022-01-08 DIAGNOSIS — G3184 Mild cognitive impairment, so stated: Secondary | ICD-10-CM | POA: Diagnosis not present

## 2022-01-08 DIAGNOSIS — Z9181 History of falling: Secondary | ICD-10-CM | POA: Diagnosis not present

## 2022-01-08 DIAGNOSIS — M1990 Unspecified osteoarthritis, unspecified site: Secondary | ICD-10-CM | POA: Diagnosis not present

## 2022-01-09 DIAGNOSIS — R32 Unspecified urinary incontinence: Secondary | ICD-10-CM | POA: Diagnosis not present

## 2022-01-09 DIAGNOSIS — R7303 Prediabetes: Secondary | ICD-10-CM | POA: Diagnosis not present

## 2022-01-09 DIAGNOSIS — N1832 Chronic kidney disease, stage 3b: Secondary | ICD-10-CM | POA: Diagnosis not present

## 2022-01-09 DIAGNOSIS — I7300 Raynaud's syndrome without gangrene: Secondary | ICD-10-CM | POA: Diagnosis not present

## 2022-01-09 DIAGNOSIS — F325 Major depressive disorder, single episode, in full remission: Secondary | ICD-10-CM | POA: Diagnosis not present

## 2022-01-09 DIAGNOSIS — M8589 Other specified disorders of bone density and structure, multiple sites: Secondary | ICD-10-CM | POA: Diagnosis not present

## 2022-01-09 DIAGNOSIS — E782 Mixed hyperlipidemia: Secondary | ICD-10-CM | POA: Diagnosis not present

## 2022-01-09 DIAGNOSIS — I129 Hypertensive chronic kidney disease with stage 1 through stage 4 chronic kidney disease, or unspecified chronic kidney disease: Secondary | ICD-10-CM | POA: Diagnosis not present

## 2022-01-09 DIAGNOSIS — M159 Polyosteoarthritis, unspecified: Secondary | ICD-10-CM | POA: Diagnosis not present

## 2022-01-09 DIAGNOSIS — K219 Gastro-esophageal reflux disease without esophagitis: Secondary | ICD-10-CM | POA: Diagnosis not present

## 2022-01-09 DIAGNOSIS — E039 Hypothyroidism, unspecified: Secondary | ICD-10-CM | POA: Diagnosis not present

## 2022-01-09 DIAGNOSIS — R609 Edema, unspecified: Secondary | ICD-10-CM | POA: Diagnosis not present

## 2022-01-11 DIAGNOSIS — M7062 Trochanteric bursitis, left hip: Secondary | ICD-10-CM | POA: Diagnosis not present

## 2022-01-11 DIAGNOSIS — Z9181 History of falling: Secondary | ICD-10-CM | POA: Diagnosis not present

## 2022-01-11 DIAGNOSIS — M5136 Other intervertebral disc degeneration, lumbar region: Secondary | ICD-10-CM | POA: Diagnosis not present

## 2022-01-11 DIAGNOSIS — M7061 Trochanteric bursitis, right hip: Secondary | ICD-10-CM | POA: Diagnosis not present

## 2022-01-11 DIAGNOSIS — R32 Unspecified urinary incontinence: Secondary | ICD-10-CM | POA: Diagnosis not present

## 2022-01-13 ENCOUNTER — Inpatient Hospital Stay (HOSPITAL_COMMUNITY)
Admission: EM | Admit: 2022-01-13 | Discharge: 2022-01-16 | DRG: 522 | Disposition: A | Discharge status: Skilled Nursing Facility | Payer: Medicare Other | Attending: Internal Medicine | Admitting: Internal Medicine

## 2022-01-13 ENCOUNTER — Emergency Department (HOSPITAL_COMMUNITY): Payer: Medicare Other

## 2022-01-13 ENCOUNTER — Other Ambulatory Visit: Payer: Self-pay

## 2022-01-13 DIAGNOSIS — Z79899 Other long term (current) drug therapy: Secondary | ICD-10-CM

## 2022-01-13 DIAGNOSIS — S72002A Fracture of unspecified part of neck of left femur, initial encounter for closed fracture: Principal | ICD-10-CM

## 2022-01-13 DIAGNOSIS — K219 Gastro-esophageal reflux disease without esophagitis: Secondary | ICD-10-CM | POA: Diagnosis present

## 2022-01-13 DIAGNOSIS — I700 Atherosclerosis of aorta: Secondary | ICD-10-CM | POA: Diagnosis not present

## 2022-01-13 DIAGNOSIS — Z87440 Personal history of urinary (tract) infections: Secondary | ICD-10-CM

## 2022-01-13 DIAGNOSIS — E785 Hyperlipidemia, unspecified: Secondary | ICD-10-CM | POA: Diagnosis present

## 2022-01-13 DIAGNOSIS — W1830XA Fall on same level, unspecified, initial encounter: Secondary | ICD-10-CM | POA: Diagnosis present

## 2022-01-13 DIAGNOSIS — E039 Hypothyroidism, unspecified: Secondary | ICD-10-CM | POA: Diagnosis present

## 2022-01-13 DIAGNOSIS — F32A Depression, unspecified: Secondary | ICD-10-CM | POA: Diagnosis present

## 2022-01-13 DIAGNOSIS — F0280 Dementia in other diseases classified elsewhere without behavioral disturbance: Secondary | ICD-10-CM | POA: Diagnosis present

## 2022-01-13 DIAGNOSIS — Z90710 Acquired absence of both cervix and uterus: Secondary | ICD-10-CM

## 2022-01-13 DIAGNOSIS — Y92009 Unspecified place in unspecified non-institutional (private) residence as the place of occurrence of the external cause: Secondary | ICD-10-CM

## 2022-01-13 DIAGNOSIS — F0390 Unspecified dementia without behavioral disturbance: Secondary | ICD-10-CM | POA: Diagnosis not present

## 2022-01-13 DIAGNOSIS — S72012A Unspecified intracapsular fracture of left femur, initial encounter for closed fracture: Secondary | ICD-10-CM | POA: Diagnosis not present

## 2022-01-13 DIAGNOSIS — G309 Alzheimer's disease, unspecified: Secondary | ICD-10-CM | POA: Diagnosis present

## 2022-01-13 DIAGNOSIS — D62 Acute posthemorrhagic anemia: Secondary | ICD-10-CM | POA: Diagnosis not present

## 2022-01-13 DIAGNOSIS — N1831 Chronic kidney disease, stage 3a: Secondary | ICD-10-CM | POA: Diagnosis present

## 2022-01-13 DIAGNOSIS — E875 Hyperkalemia: Secondary | ICD-10-CM | POA: Diagnosis not present

## 2022-01-13 DIAGNOSIS — Z043 Encounter for examination and observation following other accident: Secondary | ICD-10-CM | POA: Diagnosis not present

## 2022-01-13 DIAGNOSIS — F02A4 Dementia in other diseases classified elsewhere, mild, with anxiety: Secondary | ICD-10-CM | POA: Diagnosis present

## 2022-01-13 DIAGNOSIS — F02A3 Dementia in other diseases classified elsewhere, mild, with mood disturbance: Secondary | ICD-10-CM | POA: Diagnosis present

## 2022-01-13 DIAGNOSIS — I129 Hypertensive chronic kidney disease with stage 1 through stage 4 chronic kidney disease, or unspecified chronic kidney disease: Secondary | ICD-10-CM | POA: Diagnosis present

## 2022-01-13 DIAGNOSIS — Z96649 Presence of unspecified artificial hip joint: Secondary | ICD-10-CM

## 2022-01-13 DIAGNOSIS — M25552 Pain in left hip: Secondary | ICD-10-CM | POA: Diagnosis not present

## 2022-01-13 DIAGNOSIS — W19XXXA Unspecified fall, initial encounter: Secondary | ICD-10-CM | POA: Diagnosis not present

## 2022-01-13 DIAGNOSIS — Z880 Allergy status to penicillin: Secondary | ICD-10-CM

## 2022-01-13 DIAGNOSIS — I10 Essential (primary) hypertension: Secondary | ICD-10-CM | POA: Diagnosis not present

## 2022-01-13 DIAGNOSIS — N179 Acute kidney failure, unspecified: Secondary | ICD-10-CM | POA: Diagnosis present

## 2022-01-13 MED ORDER — MORPHINE SULFATE (PF) 2 MG/ML IV SOLN
INTRAVENOUS | Status: AC
  Administered 2022-01-13: 4 mg via INTRAVENOUS
  Filled 2022-01-13: qty 2

## 2022-01-13 MED ORDER — ONDANSETRON HCL 4 MG/2ML IJ SOLN
INTRAMUSCULAR | Status: AC
  Filled 2022-01-13: qty 2

## 2022-01-13 MED ORDER — ONDANSETRON HCL 4 MG/2ML IJ SOLN
4.0000 mg | Freq: Once | INTRAMUSCULAR | Status: AC
  Administered 2022-01-13: 4 mg via INTRAVENOUS

## 2022-01-13 MED ORDER — MORPHINE SULFATE (PF) 4 MG/ML IV SOLN
4.0000 mg | Freq: Once | INTRAVENOUS | Status: AC
  Administered 2022-01-13: 4 mg via INTRAVENOUS

## 2022-01-13 NOTE — ED Provider Notes (Signed)
[facility] EMERGENCY DEPARTMENT Provider Note   CSN: [id number] Arrival date & time: 01/13/22  2239     History {Add pertinent medical, surgical, social history, OB history to HPI:1} Chief Complaint  Patient presents with   Hip Pain    Michelle Jones is a 88 y.o. female who presents emergency department with a chief complaint of hip pain.  Patient states that she was at her home talking with a friend when she fell.  She states "I was just standing there and the next thing I know I was on the floor."  She did hit her head "just a little bit."  It is not on any blood thinners.  She complains of pain in her left hip and was unable to get get up after fall.  She denies any numbness or tingling.  She has a past medical history of mild dementia, urinary tract infection, history of thrombocytopenia, hypothyroidism and AKI, hyperlipidemia and hypertension.  Hip Pain      Home Medications Prior to Admission medications   Medication Sig Start Date End Date Taking? Authorizing Provider  amLODipine (NORVASC) 10 MG tablet Take 10 mg by mouth daily.    [provider]  buPROPion (WELLBUTRIN XL) 300 MG 24 hr tablet Take one tablet by mouth once daily for depression 10/03/14   [provider]  Coenzyme Q10 (CO Q 10) 100 MG CAPS Take 100 mg by mouth daily.    [provider]  diclofenac Sodium (VOLTAREN) 1 % GEL Apply 4 g topically 4 (four) times daily. 09/28/21   Deno Etienne, DO  DULoxetine (CYMBALTA) 60 MG capsule Take one capsule by mouth daily for depression 10/03/14   [provider]  ferrous gluconate (FERGON) 225 (27 Fe) MG tablet Take 240 mg by mouth every morning.    [provider]  furosemide (LASIX) 20 MG tablet Take 20-40 mg by mouth daily as needed for fluid.     [provider]  hyoscyamine (LEVSIN, ANASPAZ) 0.125 MG tablet Take 0.125-0.25 mg by mouth every 4 (four) hours as needed for cramping.    [provider]  levothyroxine (SYNTHROID, LEVOTHROID) 100 MCG tablet Take one tablet by mouth every morning before breakfast for hypothyroidism 10/22/14   [provider]  lisinopril-hydrochlorothiazide (PRINZIDE,ZESTORETIC) 20-25 MG tablet Take 1 tablet by mouth daily.    [provider]  memantine (NAMENDA) 5 MG tablet . Take 1 tablet (5 mg at night) for 2 weeks, then increase to 1 tablet (5 mg) twice a day 12/26/21   Rondel Jumbo, PA-C  oxybutynin (DITROPAN) 5 MG tablet Take 5 mg by mouth every 8 (eight) hours as needed for bladder spasms.    [provider]  rosuvastatin (CRESTOR) 10 MG tablet Take 10 mg by mouth daily.    [provider]  tamsulosin (FLOMAX) 0.4 MG CAPS capsule Take 1 capsule (0.4 mg total) by mouth daily after supper. 09/21/18   Modena Jansky, MD      Allergies    Penicillins    Review of Systems   Review of Systems  Physical Exam Updated Vital Signs BP (!) 173/66   Pulse (!) 59   Temp 97.6 F (36.4 C) (Oral)   Resp 16   SpO2 96%  Physical Exam Vitals and nursing note reviewed.  Constitutional:      General: She is not in acute distress.    Appearance: She is well-developed. She is not diaphoretic.  HENT:  Head: Normocephalic and atraumatic.     Right Ear: External ear normal.     Left Ear: External ear normal.     Nose: Nose normal.     Mouth/Throat:     Mouth: Mucous membranes are moist.  Eyes:     General: No scleral icterus.    Conjunctiva/sclera: Conjunctivae normal.  Cardiovascular:     Rate and Rhythm: Normal rate and regular rhythm.     Heart sounds: Normal heart sounds. No murmur heard.   No friction rub. No gallop.  Pulmonary:     Effort: Pulmonary effort is normal. No respiratory distress.     Breath sounds: Normal breath sounds.  Abdominal:     General: Bowel sounds are normal. There is no distension.     Palpations: Abdomen is soft. There is no mass.     Tenderness: There is no abdominal tenderness.  There is no guarding.  Musculoskeletal:     Cervical back: Normal range of motion.     Comments: Left lower extremity is shortened and laterally rotated, 1+ DP pulse bilaterally.  Skin:    General: Skin is warm and dry.  Neurological:     Mental Status: She is alert and oriented to person, place, and time.  Psychiatric:        Behavior: Behavior normal.    ED Results / Procedures / Treatments   Labs (all labs ordered are listed, but only abnormal results are displayed) Labs Reviewed  BASIC METABOLIC PANEL  CBC WITH DIFFERENTIAL/PLATELET  PROTIME-INR  TYPE AND SCREEN    EKG None  Radiology No results found.  Procedures Procedures  {Document cardiac monitor, telemetry assessment procedure when appropriate:1}  Medications Ordered in ED Medications - No data to display  ED Course/ Medical Decision Making/ A&P                           Medical Decision Making Amount and/or Complexity of Data Reviewed Labs: ordered. Radiology: ordered.   ***  {Document critical care time when appropriate:1} {Document review of labs and clinical decision tools ie heart score, Chads2Vasc2 etc:1}  {Document your independent review of radiology images, and any outside records:1} {Document your discussion with family members, caretakers, and with consultants:1} {Document social determinants of health affecting pt's care:1} {Document your decision making why or why not admission, treatments were needed:1} Final Clinical Impression(s) / ED Diagnoses Final diagnoses:  None    Rx / DC Orders ED Discharge Orders     None

## 2022-01-13 NOTE — ED Triage Notes (Signed)
Pt arrived via EMS for mechanical fall 30 mins prior to arrival, c/o L hip pain.

## 2022-01-14 ENCOUNTER — Inpatient Hospital Stay (HOSPITAL_COMMUNITY): Payer: Medicare Other

## 2022-01-14 ENCOUNTER — Encounter (HOSPITAL_COMMUNITY)
Admission: EM | Disposition: A | Discharge status: Skilled Nursing Facility | Payer: Self-pay | Source: Home / Self Care | Attending: Internal Medicine

## 2022-01-14 ENCOUNTER — Inpatient Hospital Stay (HOSPITAL_COMMUNITY): Payer: Medicare Other | Admitting: Certified Registered Nurse Anesthetist

## 2022-01-14 ENCOUNTER — Inpatient Hospital Stay (HOSPITAL_COMMUNITY): Payer: Medicare Other | Admitting: Anesthesiology

## 2022-01-14 ENCOUNTER — Encounter (HOSPITAL_COMMUNITY): Payer: Self-pay | Admitting: Family Medicine

## 2022-01-14 ENCOUNTER — Emergency Department (HOSPITAL_COMMUNITY): Payer: Medicare Other

## 2022-01-14 ENCOUNTER — Other Ambulatory Visit (HOSPITAL_COMMUNITY): Payer: Self-pay

## 2022-01-14 ENCOUNTER — Other Ambulatory Visit: Payer: Self-pay

## 2022-01-14 DIAGNOSIS — S72002A Fracture of unspecified part of neck of left femur, initial encounter for closed fracture: Secondary | ICD-10-CM | POA: Diagnosis not present

## 2022-01-14 DIAGNOSIS — E785 Hyperlipidemia, unspecified: Secondary | ICD-10-CM | POA: Diagnosis present

## 2022-01-14 DIAGNOSIS — W19XXXA Unspecified fall, initial encounter: Secondary | ICD-10-CM | POA: Diagnosis not present

## 2022-01-14 DIAGNOSIS — S72002D Fracture of unspecified part of neck of left femur, subsequent encounter for closed fracture with routine healing: Secondary | ICD-10-CM | POA: Diagnosis not present

## 2022-01-14 DIAGNOSIS — I10 Essential (primary) hypertension: Secondary | ICD-10-CM

## 2022-01-14 DIAGNOSIS — Z880 Allergy status to penicillin: Secondary | ICD-10-CM | POA: Diagnosis not present

## 2022-01-14 DIAGNOSIS — Z96649 Presence of unspecified artificial hip joint: Secondary | ICD-10-CM | POA: Diagnosis not present

## 2022-01-14 DIAGNOSIS — S72012A Unspecified intracapsular fracture of left femur, initial encounter for closed fracture: Secondary | ICD-10-CM | POA: Diagnosis present

## 2022-01-14 DIAGNOSIS — F0280 Dementia in other diseases classified elsewhere without behavioral disturbance: Secondary | ICD-10-CM | POA: Diagnosis not present

## 2022-01-14 DIAGNOSIS — N179 Acute kidney failure, unspecified: Secondary | ICD-10-CM | POA: Diagnosis not present

## 2022-01-14 DIAGNOSIS — G309 Alzheimer's disease, unspecified: Secondary | ICD-10-CM

## 2022-01-14 DIAGNOSIS — Z90710 Acquired absence of both cervix and uterus: Secondary | ICD-10-CM | POA: Diagnosis not present

## 2022-01-14 DIAGNOSIS — N1831 Chronic kidney disease, stage 3a: Secondary | ICD-10-CM | POA: Diagnosis present

## 2022-01-14 DIAGNOSIS — Y92009 Unspecified place in unspecified non-institutional (private) residence as the place of occurrence of the external cause: Secondary | ICD-10-CM | POA: Diagnosis not present

## 2022-01-14 DIAGNOSIS — Z9889 Other specified postprocedural states: Secondary | ICD-10-CM | POA: Diagnosis not present

## 2022-01-14 DIAGNOSIS — F02A4 Dementia in other diseases classified elsewhere, mild, with anxiety: Secondary | ICD-10-CM | POA: Diagnosis present

## 2022-01-14 DIAGNOSIS — K219 Gastro-esophageal reflux disease without esophagitis: Secondary | ICD-10-CM | POA: Diagnosis present

## 2022-01-14 DIAGNOSIS — E440 Moderate protein-calorie malnutrition: Secondary | ICD-10-CM | POA: Diagnosis not present

## 2022-01-14 DIAGNOSIS — E039 Hypothyroidism, unspecified: Secondary | ICD-10-CM

## 2022-01-14 DIAGNOSIS — F32A Depression, unspecified: Secondary | ICD-10-CM | POA: Diagnosis present

## 2022-01-14 DIAGNOSIS — Z471 Aftercare following joint replacement surgery: Secondary | ICD-10-CM | POA: Diagnosis not present

## 2022-01-14 DIAGNOSIS — Z741 Need for assistance with personal care: Secondary | ICD-10-CM | POA: Diagnosis not present

## 2022-01-14 DIAGNOSIS — D62 Acute posthemorrhagic anemia: Secondary | ICD-10-CM | POA: Diagnosis not present

## 2022-01-14 DIAGNOSIS — Z043 Encounter for examination and observation following other accident: Secondary | ICD-10-CM | POA: Diagnosis not present

## 2022-01-14 DIAGNOSIS — S72002S Fracture of unspecified part of neck of left femur, sequela: Secondary | ICD-10-CM | POA: Diagnosis not present

## 2022-01-14 DIAGNOSIS — R41841 Cognitive communication deficit: Secondary | ICD-10-CM | POA: Diagnosis not present

## 2022-01-14 DIAGNOSIS — W1830XA Fall on same level, unspecified, initial encounter: Secondary | ICD-10-CM | POA: Diagnosis present

## 2022-01-14 DIAGNOSIS — M6281 Muscle weakness (generalized): Secondary | ICD-10-CM | POA: Diagnosis not present

## 2022-01-14 DIAGNOSIS — Z79899 Other long term (current) drug therapy: Secondary | ICD-10-CM | POA: Diagnosis not present

## 2022-01-14 DIAGNOSIS — R2681 Unsteadiness on feet: Secondary | ICD-10-CM | POA: Diagnosis not present

## 2022-01-14 DIAGNOSIS — N289 Disorder of kidney and ureter, unspecified: Secondary | ICD-10-CM | POA: Diagnosis not present

## 2022-01-14 DIAGNOSIS — Z87440 Personal history of urinary (tract) infections: Secondary | ICD-10-CM | POA: Diagnosis not present

## 2022-01-14 DIAGNOSIS — M6259 Muscle wasting and atrophy, not elsewhere classified, multiple sites: Secondary | ICD-10-CM | POA: Diagnosis not present

## 2022-01-14 DIAGNOSIS — R4182 Altered mental status, unspecified: Secondary | ICD-10-CM | POA: Diagnosis not present

## 2022-01-14 DIAGNOSIS — Z7401 Bed confinement status: Secondary | ICD-10-CM | POA: Diagnosis not present

## 2022-01-14 DIAGNOSIS — G8918 Other acute postprocedural pain: Secondary | ICD-10-CM | POA: Diagnosis not present

## 2022-01-14 DIAGNOSIS — I129 Hypertensive chronic kidney disease with stage 1 through stage 4 chronic kidney disease, or unspecified chronic kidney disease: Secondary | ICD-10-CM | POA: Diagnosis present

## 2022-01-14 DIAGNOSIS — E875 Hyperkalemia: Secondary | ICD-10-CM | POA: Diagnosis not present

## 2022-01-14 DIAGNOSIS — F02A3 Dementia in other diseases classified elsewhere, mild, with mood disturbance: Secondary | ICD-10-CM | POA: Diagnosis present

## 2022-01-14 DIAGNOSIS — F0390 Unspecified dementia without behavioral disturbance: Secondary | ICD-10-CM | POA: Diagnosis not present

## 2022-01-14 HISTORY — PX: TOTAL HIP ARTHROPLASTY: SHX124

## 2022-01-14 LAB — BASIC METABOLIC PANEL
Anion gap: 10 (ref 5–15)
BUN: 22 mg/dL (ref 8–23)
CO2: 23 mmol/L (ref 22–32)
Calcium: 9 mg/dL (ref 8.9–10.3)
Chloride: 106 mmol/L (ref 98–111)
Creatinine, Ser: 1.32 mg/dL — ABNORMAL HIGH (ref 0.44–1.00)
GFR, Estimated: 38 mL/min — ABNORMAL LOW (ref >60)
Glucose, Bld: 103 mg/dL — ABNORMAL HIGH (ref 70–99)
Potassium: 3.8 mmol/L (ref 3.5–5.1)
Sodium: 139 mmol/L (ref 135–145)

## 2022-01-14 LAB — PROTIME-INR
INR: 1 (ref 0.8–1.2)
Prothrombin Time: 13.2 seconds (ref 11.4–15.2)

## 2022-01-14 LAB — CBC WITH DIFFERENTIAL/PLATELET
Abs Immature Granulocytes: 0.12 10*3/uL — ABNORMAL HIGH (ref 0.00–0.07)
Basophils Absolute: 0 10*3/uL (ref 0.0–0.1)
Basophils Relative: 0 %
Eosinophils Absolute: 0.2 10*3/uL (ref 0.0–0.5)
Eosinophils Relative: 2 %
HCT: 38.9 % (ref 36.0–46.0)
Hemoglobin: 12.5 g/dL (ref 12.0–15.0)
Immature Granulocytes: 1 %
Lymphocytes Relative: 18 %
Lymphs Abs: 1.5 10*3/uL (ref 0.7–4.0)
MCH: 33.3 pg (ref 26.0–34.0)
MCHC: 32.1 g/dL (ref 30.0–36.0)
MCV: 103.7 fL — ABNORMAL HIGH (ref 80.0–100.0)
Monocytes Absolute: 0.5 10*3/uL (ref 0.1–1.0)
Monocytes Relative: 6 %
Neutro Abs: 6 10*3/uL (ref 1.7–7.7)
Neutrophils Relative %: 73 %
Platelets: 164 10*3/uL (ref 150–400)
RBC: 3.75 MIL/uL — ABNORMAL LOW (ref 3.87–5.11)
RDW: 12.4 % (ref 11.5–15.5)
WBC: 8.3 10*3/uL (ref 4.0–10.5)
nRBC: 0 % (ref 0.0–0.2)

## 2022-01-14 LAB — SURGICAL PCR SCREEN
MRSA, PCR: NEGATIVE
Staphylococcus aureus: POSITIVE — AB

## 2022-01-14 LAB — TYPE AND SCREEN
ABO/RH(D): A POS
Antibody Screen: NEGATIVE

## 2022-01-14 SURGERY — ARTHROPLASTY, HIP, TOTAL,POSTERIOR APPROACH
Anesthesia: General | Site: Hip | Laterality: Left

## 2022-01-14 MED ORDER — METOCLOPRAMIDE HCL 5 MG PO TABS: 5.0000 mg | ORAL_TABLET | Freq: Three times a day (TID) | ORAL | Status: DC | PRN

## 2022-01-14 MED ORDER — TRANEXAMIC ACID-NACL 1000-0.7 MG/100ML-% IV SOLN
INTRAVENOUS | Status: DC | PRN
  Administered 2022-01-14: 1000 mg via INTRAVENOUS

## 2022-01-14 MED ORDER — LACTATED RINGERS IV SOLN: INTRAVENOUS | Status: DC

## 2022-01-14 MED ORDER — HYDROCODONE-ACETAMINOPHEN 5-325 MG PO TABS: 1.0000 | ORAL_TABLET | ORAL | Status: DC | PRN

## 2022-01-14 MED ORDER — LIDOCAINE 2% (20 MG/ML) 5 ML SYRINGE
INTRAMUSCULAR | Status: DC | PRN
  Administered 2022-01-14: 40 mg via INTRAVENOUS

## 2022-01-14 MED ORDER — CHLORHEXIDINE GLUCONATE 0.12 % MT SOLN: 15.0000 mL | Freq: Once | OROMUCOSAL | Status: AC

## 2022-01-14 MED ORDER — CHLORHEXIDINE GLUCONATE CLOTH 2 % EX PADS
6.0000 | MEDICATED_PAD | Freq: Every day | CUTANEOUS | Status: DC
  Administered 2022-01-15 – 2022-01-16 (×2): 6 via TOPICAL

## 2022-01-14 MED ORDER — ONDANSETRON HCL 4 MG/2ML IJ SOLN
INTRAMUSCULAR | Status: DC | PRN
  Administered 2022-01-14: 4 mg via INTRAVENOUS

## 2022-01-14 MED ORDER — SODIUM CHLORIDE 0.9 % IR SOLN
Status: DC | PRN
  Administered 2022-01-14: 1000 mL

## 2022-01-14 MED ORDER — BISACODYL 10 MG RE SUPP: 10.0000 mg | Freq: Every day | RECTAL | Status: DC | PRN

## 2022-01-14 MED ORDER — DOCUSATE SODIUM 100 MG PO CAPS
100.0000 mg | ORAL_CAPSULE | Freq: Two times a day (BID) | ORAL | Status: DC
  Administered 2022-01-14 – 2022-01-16 (×4): 100 mg via ORAL
  Filled 2022-01-14 (×4): qty 1

## 2022-01-14 MED ORDER — FENTANYL CITRATE (PF) 100 MCG/2ML IJ SOLN: 25.0000 ug | INTRAMUSCULAR | Status: DC | PRN

## 2022-01-14 MED ORDER — TRANEXAMIC ACID-NACL 1000-0.7 MG/100ML-% IV SOLN
1000.0000 mg | Freq: Once | INTRAVENOUS | Status: AC
  Administered 2022-01-14: 1000 mg via INTRAVENOUS
  Filled 2022-01-14: qty 100

## 2022-01-14 MED ORDER — ONDANSETRON HCL 4 MG PO TABS: 4.0000 mg | ORAL_TABLET | Freq: Four times a day (QID) | ORAL | Status: DC | PRN

## 2022-01-14 MED ORDER — DULOXETINE HCL 60 MG PO CPEP
60.0000 mg | ORAL_CAPSULE | Freq: Every morning | ORAL | Status: DC
  Administered 2022-01-14 – 2022-01-16 (×3): 60 mg via ORAL
  Filled 2022-01-14 (×3): qty 1

## 2022-01-14 MED ORDER — PHENOL 1.4 % MT LIQD
1.0000 | OROMUCOSAL | Status: DC | PRN
Start: 2022-01-14 — End: 2022-01-16

## 2022-01-14 MED ORDER — PROPOFOL 10 MG/ML IV BOLUS
INTRAVENOUS | Status: DC | PRN
  Administered 2022-01-14: 70 mg via INTRAVENOUS

## 2022-01-14 MED ORDER — SODIUM CHLORIDE 0.9 % IV SOLN: INTRAVENOUS | Status: DC

## 2022-01-14 MED ORDER — MENTHOL 3 MG MT LOZG
1.0000 | LOZENGE | OROMUCOSAL | Status: DC | PRN
Start: 2022-01-14 — End: 2022-01-16

## 2022-01-14 MED ORDER — PHENYLEPHRINE 80 MCG/ML (10ML) SYRINGE FOR IV PUSH (FOR BLOOD PRESSURE SUPPORT)
PREFILLED_SYRINGE | INTRAVENOUS | Status: DC | PRN
  Administered 2022-01-14: 160 ug via INTRAVENOUS
  Administered 2022-01-14 (×3): 80 ug via INTRAVENOUS

## 2022-01-14 MED ORDER — DIPHENHYDRAMINE HCL 12.5 MG/5ML PO ELIX: 12.5000 mg | ORAL_SOLUTION | ORAL | Status: DC | PRN

## 2022-01-14 MED ORDER — POLYETHYLENE GLYCOL 3350 17 G PO PACK: 17.0000 g | PACK | Freq: Every day | ORAL | Status: DC | PRN

## 2022-01-14 MED ORDER — ORAL CARE MOUTH RINSE: 15.0000 mL | Freq: Once | OROMUCOSAL | Status: AC

## 2022-01-14 MED ORDER — TRANEXAMIC ACID-NACL 1000-0.7 MG/100ML-% IV SOLN
INTRAVENOUS | Status: AC
  Filled 2022-01-14: qty 100

## 2022-01-14 MED ORDER — POVIDONE-IODINE 10 % EX SWAB: 2.0000 "application " | Freq: Once | CUTANEOUS | Status: DC

## 2022-01-14 MED ORDER — ROPIVACAINE HCL 5 MG/ML IJ SOLN
INTRAMUSCULAR | Status: DC | PRN
  Administered 2022-01-14: 20 mL via PERINEURAL

## 2022-01-14 MED ORDER — ROCURONIUM BROMIDE 10 MG/ML (PF) SYRINGE
PREFILLED_SYRINGE | INTRAVENOUS | Status: DC | PRN
  Administered 2022-01-14: 60 mg via INTRAVENOUS

## 2022-01-14 MED ORDER — OXYBUTYNIN CHLORIDE 5 MG PO TABS
5.0000 mg | ORAL_TABLET | Freq: Two times a day (BID) | ORAL | Status: DC
  Administered 2022-01-14 – 2022-01-16 (×5): 5 mg via ORAL
  Filled 2022-01-14 (×6): qty 1

## 2022-01-14 MED ORDER — DEXAMETHASONE SODIUM PHOSPHATE 10 MG/ML IJ SOLN
INTRAMUSCULAR | Status: DC | PRN
  Administered 2022-01-14: 4 mg via INTRAVENOUS

## 2022-01-14 MED ORDER — METHOCARBAMOL 500 MG PO TABS: 500.0000 mg | ORAL_TABLET | Freq: Four times a day (QID) | ORAL | Status: DC | PRN

## 2022-01-14 MED ORDER — FENTANYL CITRATE (PF) 250 MCG/5ML IJ SOLN
INTRAMUSCULAR | Status: AC
  Filled 2022-01-14: qty 5

## 2022-01-14 MED ORDER — SENNA 8.6 MG PO TABS
1.0000 | ORAL_TABLET | Freq: Two times a day (BID) | ORAL | Status: DC
  Administered 2022-01-14 – 2022-01-16 (×5): 8.6 mg via ORAL
  Filled 2022-01-14 (×5): qty 1

## 2022-01-14 MED ORDER — METOCLOPRAMIDE HCL 5 MG/ML IJ SOLN: 5.0000 mg | Freq: Three times a day (TID) | INTRAMUSCULAR | Status: DC | PRN

## 2022-01-14 MED ORDER — CEFAZOLIN SODIUM-DEXTROSE 2-4 GM/100ML-% IV SOLN
2.0000 g | Freq: Four times a day (QID) | INTRAVENOUS | Status: AC
  Administered 2022-01-15 (×2): 2 g via INTRAVENOUS
  Filled 2022-01-14 (×2): qty 100

## 2022-01-14 MED ORDER — CHLORHEXIDINE GLUCONATE 4 % EX LIQD: 60.0000 mL | Freq: Once | CUTANEOUS | Status: DC

## 2022-01-14 MED ORDER — DEXAMETHASONE SODIUM PHOSPHATE 10 MG/ML IJ SOLN
10.0000 mg | Freq: Once | INTRAMUSCULAR | Status: AC
  Administered 2022-01-15: 10 mg via INTRAVENOUS
  Filled 2022-01-14: qty 1

## 2022-01-14 MED ORDER — METOPROLOL SUCCINATE ER 50 MG PO TB24
100.0000 mg | ORAL_TABLET | Freq: Every morning | ORAL | Status: DC
  Administered 2022-01-14 – 2022-01-16 (×2): 100 mg via ORAL
  Filled 2022-01-14: qty 1
  Filled 2022-01-14: qty 2

## 2022-01-14 MED ORDER — SUGAMMADEX SODIUM 200 MG/2ML IV SOLN
INTRAVENOUS | Status: DC | PRN
  Administered 2022-01-14: 400 mg via INTRAVENOUS

## 2022-01-14 MED ORDER — MEMANTINE HCL 10 MG PO TABS
5.0000 mg | ORAL_TABLET | Freq: Every day | ORAL | Status: DC
  Administered 2022-01-14 – 2022-01-15 (×2): 5 mg via ORAL
  Filled 2022-01-14 (×2): qty 1

## 2022-01-14 MED ORDER — CEFAZOLIN SODIUM-DEXTROSE 2-4 GM/100ML-% IV SOLN: 2.0000 g | INTRAVENOUS | Status: DC

## 2022-01-14 MED ORDER — BUPROPION HCL ER (XL) 150 MG PO TB24
300.0000 mg | ORAL_TABLET | Freq: Every morning | ORAL | Status: DC
  Administered 2022-01-14 – 2022-01-16 (×3): 300 mg via ORAL
  Filled 2022-01-14: qty 1
  Filled 2022-01-14 (×2): qty 2

## 2022-01-14 MED ORDER — MUPIROCIN 2 % EX OINT
1.0000 "application " | TOPICAL_OINTMENT | Freq: Two times a day (BID) | CUTANEOUS | Status: DC
  Administered 2022-01-14 – 2022-01-16 (×4): 1 via NASAL
  Filled 2022-01-14 (×2): qty 22

## 2022-01-14 MED ORDER — MORPHINE SULFATE (PF) 2 MG/ML IV SOLN
0.5000 mg | INTRAVENOUS | Status: DC | PRN
  Administered 2022-01-14: 0.5 mg via INTRAVENOUS
  Filled 2022-01-14: qty 1

## 2022-01-14 MED ORDER — FERROUS SULFATE 325 (65 FE) MG PO TABS
325.0000 mg | ORAL_TABLET | Freq: Three times a day (TID) | ORAL | Status: DC
  Administered 2022-01-15 – 2022-01-16 (×5): 325 mg via ORAL
  Filled 2022-01-14 (×5): qty 1

## 2022-01-14 MED ORDER — LIDOCAINE 2% (20 MG/ML) 5 ML SYRINGE
INTRAMUSCULAR | Status: AC
  Filled 2022-01-14: qty 5

## 2022-01-14 MED ORDER — CEFAZOLIN SODIUM-DEXTROSE 2-3 GM-%(50ML) IV SOLR
INTRAVENOUS | Status: DC | PRN
  Administered 2022-01-14: 2 g via INTRAVENOUS

## 2022-01-14 MED ORDER — DEXAMETHASONE SODIUM PHOSPHATE 10 MG/ML IJ SOLN
INTRAMUSCULAR | Status: AC
Start: 2022-01-14 — End: ?
  Filled 2022-01-14: qty 1

## 2022-01-14 MED ORDER — METHOCARBAMOL 1000 MG/10ML IJ SOLN
500.0000 mg | Freq: Four times a day (QID) | INTRAVENOUS | Status: DC | PRN
  Filled 2022-01-14: qty 5

## 2022-01-14 MED ORDER — ACETAMINOPHEN 500 MG PO TABS
500.0000 mg | ORAL_TABLET | Freq: Four times a day (QID) | ORAL | Status: AC
  Administered 2022-01-15 (×3): 500 mg via ORAL
  Filled 2022-01-14 (×3): qty 1

## 2022-01-14 MED ORDER — ASPIRIN 81 MG PO CHEW
81.0000 mg | CHEWABLE_TABLET | Freq: Two times a day (BID) | ORAL | Status: DC
  Administered 2022-01-14 – 2022-01-16 (×4): 81 mg via ORAL
  Filled 2022-01-14 (×4): qty 1

## 2022-01-14 MED ORDER — ROSUVASTATIN CALCIUM 5 MG PO TABS: 10.0000 mg | ORAL_TABLET | ORAL | Status: DC

## 2022-01-14 MED ORDER — HYDROCODONE-ACETAMINOPHEN 5-325 MG PO TABS
1.0000 | ORAL_TABLET | Freq: Four times a day (QID) | ORAL | Status: DC | PRN
  Administered 2022-01-14: 2 via ORAL
  Filled 2022-01-14: qty 2

## 2022-01-14 MED ORDER — CHLORHEXIDINE GLUCONATE 0.12 % MT SOLN
OROMUCOSAL | Status: AC
  Administered 2022-01-14: 15 mL via OROMUCOSAL
  Filled 2022-01-14: qty 15

## 2022-01-14 MED ORDER — LEVOTHYROXINE SODIUM 100 MCG PO TABS
100.0000 ug | ORAL_TABLET | Freq: Every day | ORAL | Status: DC
  Administered 2022-01-14 – 2022-01-16 (×3): 100 ug via ORAL
  Filled 2022-01-14 (×4): qty 1

## 2022-01-14 MED ORDER — PHENYLEPHRINE 80 MCG/ML (10ML) SYRINGE FOR IV PUSH (FOR BLOOD PRESSURE SUPPORT)
PREFILLED_SYRINGE | INTRAVENOUS | Status: AC
  Filled 2022-01-14: qty 10

## 2022-01-14 MED ORDER — ROCURONIUM BROMIDE 10 MG/ML (PF) SYRINGE
PREFILLED_SYRINGE | INTRAVENOUS | Status: AC
Start: 2022-01-14 — End: ?
  Filled 2022-01-14: qty 10

## 2022-01-14 MED ORDER — ONDANSETRON HCL 4 MG/2ML IJ SOLN: 4.0000 mg | Freq: Four times a day (QID) | INTRAMUSCULAR | Status: DC | PRN

## 2022-01-14 MED ORDER — ADULT MULTIVITAMIN W/MINERALS CH
1.0000 | ORAL_TABLET | Freq: Every day | ORAL | Status: DC
  Administered 2022-01-15 – 2022-01-16 (×2): 1 via ORAL
  Filled 2022-01-14 (×2): qty 1

## 2022-01-14 MED ORDER — FENTANYL CITRATE (PF) 250 MCG/5ML IJ SOLN
INTRAMUSCULAR | Status: DC | PRN
  Administered 2022-01-14: 25 ug via INTRAVENOUS
  Administered 2022-01-14: 50 ug via INTRAVENOUS

## 2022-01-14 MED ORDER — ACETAMINOPHEN 325 MG PO TABS: 325.0000 mg | ORAL_TABLET | Freq: Four times a day (QID) | ORAL | Status: DC | PRN

## 2022-01-14 MED ORDER — ONDANSETRON HCL 4 MG/2ML IJ SOLN
INTRAMUSCULAR | Status: AC
  Filled 2022-01-14: qty 2

## 2022-01-14 MED ORDER — ENSURE ENLIVE PO LIQD
237.0000 mL | Freq: Three times a day (TID) | ORAL | Status: DC
  Administered 2022-01-14 – 2022-01-15 (×4): 237 mL via ORAL

## 2022-01-14 MED ORDER — CEFAZOLIN SODIUM-DEXTROSE 2-4 GM/100ML-% IV SOLN
INTRAVENOUS | Status: AC
  Filled 2022-01-14: qty 100

## 2022-01-14 SURGICAL SUPPLY — 90 items
ADH SKN CLS APL DERMABOND .7 (GAUZE/BANDAGES/DRESSINGS) ×1
ADH SKN CLS LQ APL DERMABOND (GAUZE/BANDAGES/DRESSINGS) ×1
AGENT HMST KT MTR STRL THRMB (HEMOSTASIS) ×1
APL SKNCLS STERI-STRIP NONHPOA (GAUZE/BANDAGES/DRESSINGS) ×1
BAG COUNTER SPONGE SURGICOUNT (BAG) ×6 IMPLANT
BAG SPNG CNTER NS LX DISP (BAG) ×2
BENZOIN TINCTURE PRP APPL 2/3 (GAUZE/BANDAGES/DRESSINGS) ×3 IMPLANT
BLADE SAW SAG 73X25 THK (BLADE) ×1
BLADE SAW SGTL 18X1.27X75 (BLADE) ×3 IMPLANT
BLADE SAW SGTL 73X25 THK (BLADE) ×2 IMPLANT
BRUSH FEMORAL CANAL (MISCELLANEOUS) IMPLANT
COVER BACK TABLE 24X17X13 BIG (DRAPES) IMPLANT
COVER SURGICAL LIGHT HANDLE (MISCELLANEOUS) ×3 IMPLANT
DERMABOND ADHESIVE PROPEN (GAUZE/BANDAGES/DRESSINGS) ×1
DERMABOND ADVANCED (GAUZE/BANDAGES/DRESSINGS) ×1
DERMABOND ADVANCED .7 DNX12 (GAUZE/BANDAGES/DRESSINGS) ×2 IMPLANT
DERMABOND ADVANCED .7 DNX6 (GAUZE/BANDAGES/DRESSINGS) IMPLANT
DRAPE IMP U-DRAPE 54X76 (DRAPES) ×6 IMPLANT
DRAPE INCISE IOBAN 66X45 STRL (DRAPES) IMPLANT
DRAPE INCISE IOBAN 85X60 (DRAPES) ×6 IMPLANT
DRAPE ORTHO SPLIT 77X108 STRL (DRAPES) ×8
DRAPE SURG ORHT 6 SPLT 77X108 (DRAPES) ×8 IMPLANT
DRAPE U-SHAPE 47X51 STRL (DRAPES) ×6 IMPLANT
DRESSING AQUACEL AG SP 3.5X10 (GAUZE/BANDAGES/DRESSINGS) IMPLANT
DRSG AQUACEL AG ADV 3.5X10 (GAUZE/BANDAGES/DRESSINGS) ×3 IMPLANT
DRSG AQUACEL AG SP 3.5X10 (GAUZE/BANDAGES/DRESSINGS) ×2
DRSG MEPILEX BORDER 4X12 (GAUZE/BANDAGES/DRESSINGS) ×3 IMPLANT
DRSG MEPILEX BORDER 4X8 (GAUZE/BANDAGES/DRESSINGS) ×3 IMPLANT
DURAPREP 26ML APPLICATOR (WOUND CARE) ×6 IMPLANT
ELECT BLADE 4.0 EZ CLEAN MEGAD (MISCELLANEOUS) ×4
ELECT REM PT RETURN 9FT ADLT (ELECTROSURGICAL) ×4
ELECTRODE BLDE 4.0 EZ CLN MEGD (MISCELLANEOUS) ×4 IMPLANT
ELECTRODE REM PT RTRN 9FT ADLT (ELECTROSURGICAL) ×4 IMPLANT
EVACUATOR 1/8 PVC DRAIN (DRAIN) IMPLANT
FACESHIELD WRAPAROUND (MASK) ×8 IMPLANT
FACESHIELD WRAPAROUND OR TEAM (MASK) ×8 IMPLANT
GLOVE BIOGEL PI IND STRL 7.5 (GLOVE) ×4 IMPLANT
GLOVE BIOGEL PI IND STRL 8 (GLOVE) ×4 IMPLANT
GLOVE BIOGEL PI IND STRL 8.5 (GLOVE) ×4 IMPLANT
GLOVE BIOGEL PI INDICATOR 7.5 (GLOVE) ×2
GLOVE BIOGEL PI INDICATOR 8 (GLOVE) ×2
GLOVE BIOGEL PI INDICATOR 8.5 (GLOVE) ×2
GLOVE ECLIPSE 8.0 STRL XLNG CF (GLOVE) ×6 IMPLANT
GLOVE ORTHO TXT STRL SZ7.5 (GLOVE) ×6 IMPLANT
GLOVE SURG ENC MOIS LTX SZ6 (GLOVE) ×6 IMPLANT
GLOVE SURG ORTHO 8.0 STRL STRW (GLOVE) ×3 IMPLANT
GLOVE SURG UNDER POLY LF SZ6.5 (GLOVE) ×6 IMPLANT
GOWN STRL REIN 3XL XLG LVL4 (GOWN DISPOSABLE) ×3 IMPLANT
GOWN STRL REUS W/ TWL LRG LVL3 (GOWN DISPOSABLE) ×8 IMPLANT
GOWN STRL REUS W/TWL 2XL LVL3 (GOWN DISPOSABLE) ×3 IMPLANT
GOWN STRL REUS W/TWL LRG LVL3 (GOWN DISPOSABLE) ×8
HANDPIECE INTERPULSE COAX TIP (DISPOSABLE)
HEAD FEM UNIPOLAR 43 OD STRL (Hips) ×1 IMPLANT
IMMOBILIZER KNEE 22 UNIV (SOFTGOODS) ×3 IMPLANT
IMMOBILIZER KNEE 24 THIGH 36 (MISCELLANEOUS) IMPLANT
IMMOBILIZER KNEE 24 UNIV (MISCELLANEOUS) IMPLANT
KIT BASIN OR (CUSTOM PROCEDURE TRAY) ×6 IMPLANT
KIT TURNOVER KIT B (KITS) ×6 IMPLANT
MANIFOLD NEPTUNE II (INSTRUMENTS) ×6 IMPLANT
NS IRRIG 1000ML POUR BTL (IV SOLUTION) ×6 IMPLANT
PACK TOTAL JOINT (CUSTOM PROCEDURE TRAY) ×6 IMPLANT
PACK UNIVERSAL I (CUSTOM PROCEDURE TRAY) ×6 IMPLANT
PAD ARMBOARD 7.5X6 YLW CONV (MISCELLANEOUS) ×12 IMPLANT
PRESSURIZER FEMORAL UNIV (MISCELLANEOUS) IMPLANT
SET HNDPC FAN SPRY TIP SCT (DISPOSABLE) IMPLANT
SPACER FEM TAPERED +0 12/14 (Hips) ×1 IMPLANT
SPONGE T-LAP 18X18 ~~LOC~~+RFID (SPONGE) ×3 IMPLANT
SPONGE T-LAP 4X18 ~~LOC~~+RFID (SPONGE) ×9 IMPLANT
STAPLER VISISTAT 35W (STAPLE) IMPLANT
STEM FEM SZ3 STD ACTIS (Stem) ×1 IMPLANT
STRIP CLOSURE SKIN 1/2X4 (GAUZE/BANDAGES/DRESSINGS) ×6 IMPLANT
SUCTION FRAZIER HANDLE 10FR (MISCELLANEOUS) ×2
SUCTION TUBE FRAZIER 10FR DISP (MISCELLANEOUS) ×2 IMPLANT
SURGIFLO W/THROMBIN 8M KIT (HEMOSTASIS) ×3 IMPLANT
SUT ETHIBOND NAB CT1 #1 30IN (SUTURE) ×3 IMPLANT
SUT MNCRL AB 3-0 PS2 18 (SUTURE) ×3 IMPLANT
SUT MNCRL AB 4-0 PS2 18 (SUTURE) IMPLANT
SUT VIC AB 0 CT1 27 (SUTURE) ×4
SUT VIC AB 0 CT1 27XBRD ANBCTR (SUTURE) ×4 IMPLANT
SUT VIC AB 1 CT1 27 (SUTURE) ×8
SUT VIC AB 1 CT1 27XBRD ANBCTR (SUTURE) ×8 IMPLANT
SUT VIC AB 2-0 CT1 27 (SUTURE) ×2
SUT VIC AB 2-0 CT1 TAPERPNT 27 (SUTURE) ×2 IMPLANT
SUT VLOC 180 0 24IN GS25 (SUTURE) ×2 IMPLANT
TOWEL GREEN STERILE (TOWEL DISPOSABLE) ×6 IMPLANT
TOWEL GREEN STERILE FF (TOWEL DISPOSABLE) ×6 IMPLANT
TOWER CARTRIDGE SMART MIX (DISPOSABLE) IMPLANT
TRAY CATH 16FR W/PLASTIC CATH (SET/KITS/TRAYS/PACK) IMPLANT
TUBE SUCTION HIGH CAP CLEAR NV (SUCTIONS) ×1 IMPLANT
WATER STERILE IRR 1000ML POUR (IV SOLUTION) ×12 IMPLANT

## 2022-01-14 NOTE — H&P (View-Only) (Signed)
Reason for Consult: left hip fracture Referring Physician: Thereasa Solo, MD  Michelle Jones is an 88 y.o. female.  HPI:  Michelle Jones is a 88 y.o. female with medical history significant of dementia, HTN, HLD, hypothyroidism, anxiety and depression who presents with a fall.    Pt reports standing there talking and then just remember collapsing to the ground.Pt normally ambulates with a walker but didn't use it at the time.  Denies any dizziness, lightheadedness or chest pain, palpation. No recent illness, nausea, vomiting or diarrhea.   Only new medication change was that she was started on memantine on 5/17 but started to have insomnia, wondering around at night, hallucinations and more unsteady gait. Had a "tumble" last week. Medicine was discontinued last week sometime. She has been working with PT.   Son in room this am No other concerns for pain on her extermities  Past Medical History:  Diagnosis Date   GERD (gastroesophageal reflux disease)    Hypertension     Past Surgical History:  Procedure Laterality Date   APPENDECTOMY     carpel tunnel release  2002   Dr. Herbie Baltimore Sypher   ERCP N/A 09/20/2018   Procedure: ENDOSCOPIC RETROGRADE CHOLANGIOPANCREATOGRAPHY (ERCP);  Surgeon: Ronnette Juniper, MD;  Location: [facility];  Service: Gastroenterology;  Laterality: N/A;   PANCREATIC STENT PLACEMENT  09/20/2018   Procedure: PANCREATIC STENT PLACEMENT;  Surgeon: Ronnette Juniper, MD;  Location: [facility];  Service: Gastroenterology;;   REMOVAL OF STONES  09/20/2018   Procedure: REMOVAL OF STONES;  Surgeon: Ronnette Juniper, MD;  Location: [facility];  Service: Gastroenterology;;   Joan Mayans  09/20/2018   Procedure: Joan Mayans;  Surgeon: Ronnette Juniper, MD;  Location: [facility];  Service: Gastroenterology;;   TONSILLECTOMY  1940s   VAGINAL HYSTERECTOMY     ovaries left in place??? patient on premarin postop despite premenopausal age    Family History  Problem Relation Age of Onset   Melanoma  Mother    Breast cancer Sister    Diabetes Brother     Social History:  reports that she has never smoked. She has never used smokeless tobacco. She reports that she does not drink alcohol and does not use drugs.  Allergies:  Allergies  Allergen Reactions   Penicillins     Has patient had a PCN reaction causing immediate rash, facial/tongue/throat swelling, SOB or lightheadedness with hypotension: no throat swelling or SOB Has patient had a PCN reaction causing severe rash involving mucus membranes or skin necrosis: unknown, patients states she had sores on her mouth Has patient had a PCN reaction that required hospitalization: no Has patient had a PCN reaction occurring within the last 10 years: no If all of the above answers are "NO", then may proceed with Cephalosp    Medications: I have reviewed the patient's current medications. Scheduled:  levothyroxine  100 mcg Oral Q0600   senna  1 tablet Oral BID    Results for orders placed or performed during the hospital encounter of 01/13/22 (from the past 24 hour(s))  Basic metabolic panel     Status: Abnormal   Collection Time: 01/13/22 11:40 PM  Result Value Ref Range   Sodium 139 135 - 145 mmol/L   Potassium 3.8 3.5 - 5.1 mmol/L   Chloride 106 98 - 111 mmol/L   CO2 23 22 - 32 mmol/L   Glucose, Bld 103 (H) 70 - 99 mg/dL   BUN 22 8 - 23 mg/dL   Creatinine, Ser 1.32 (H) 0.44 -  1.00 mg/dL   Calcium 9.0 8.9 - 10.3 mg/dL   GFR, Estimated 38 (L) >60 mL/min   Anion gap 10 5 - 15  CBC with Differential     Status: Abnormal   Collection Time: 01/13/22 11:40 PM  Result Value Ref Range   WBC 8.3 4.0 - 10.5 K/uL   RBC 3.75 (L) 3.87 - 5.11 MIL/uL   Hemoglobin 12.5 12.0 - 15.0 g/dL   HCT 38.9 36.0 - 46.0 %   MCV 103.7 (H) 80.0 - 100.0 fL   MCH 33.3 26.0 - 34.0 pg   MCHC 32.1 30.0 - 36.0 g/dL   RDW 12.4 11.5 - 15.5 %   Platelets 164 150 - 400 K/uL   nRBC 0.0 0.0 - 0.2 %   Neutrophils Relative % 73 %   Neutro Abs 6.0 1.7 - 7.7 K/uL    Lymphocytes Relative 18 %   Lymphs Abs 1.5 0.7 - 4.0 K/uL   Monocytes Relative 6 %   Monocytes Absolute 0.5 0.1 - 1.0 K/uL   Eosinophils Relative 2 %   Eosinophils Absolute 0.2 0.0 - 0.5 K/uL   Basophils Relative 0 %   Basophils Absolute 0.0 0.0 - 0.1 K/uL   Immature Granulocytes 1 %   Abs Immature Granulocytes 0.12 (H) 0.00 - 0.07 K/uL  Protime-INR     Status: None   Collection Time: 01/13/22 11:40 PM  Result Value Ref Range   Prothrombin Time 13.2 11.4 - 15.2 seconds   INR 1.0 0.8 - 1.2  Type and screen [place]     Status: None   Collection Time: 01/13/22 11:40 PM  Result Value Ref Range   ABO/RH(D) A POS    Antibody Screen NEG    Sample Expiration      01/16/2022,2359 Performed at [facility], [address], [place], [place] [address]      X-rayJasmine DecemberDarletta Moll HIP (WITH OR WITHOUT PELVIS) 2-3V LEFT   COMPARISON:  10/11/2021.   FINDINGS: There is a slightly comminuted subcapital femoral fracture on the left with superior subluxation of the distal fracture fragment. The remaining bony structures are intact. Mild degenerative changes are noted at the hips bilaterally and in the lower lumbar spine. Surgical clips are present in the pelvis. Vascular calcifications are noted in the lower extremities bilaterally.   IMPRESSION: Slightly comminuted subcapital femoral fracture on the left with superior subluxation of the distal fracture fragment.     Electronically Signed   By: Brett Fairy M.D.  ROS: As per this note and admission H&P  Blood pressure (!) 150/66, pulse 75, temperature 97.6 F (36.4 C), temperature source Oral, resp. rate 16, SpO2 91 %.  Physical Exam: Constitutional: NAD, calm, comfortable, elderly well-appearing female laying flat in bed Eyes:  lids and conjunctivae normal ENMT: Mucous membranes are moist.  Neck: normal, supple Respiratory: clear to auscultation bilaterally, no wheezing, no crackles. Normal respiratory  effort. No accessory muscle use.  Cardiovascular: Regular rate and rhythm, no murmurs / rubs / gallops. No extremity edema.   Abdomen: soft, no tenderness, Bowel sounds positive.  Musculoskeletal: no clubbing / cyanosis. No joint deformity upper extremities.  LLE slightly shortened with external rotation NVI distally Currently holding her right leg in flexed position Skin: no rashes, lesions, ulcers.  Neurologic: CN 2-12 grossly intact. Sensation intact, Strength 5/5 in upper extremity with good hand grip.  Psychiatric: Normal judgment and insight. Alert and oriented to self, family place and time but not current  president. Normal mood.  Assessment/Plan: Left hip displaced femoral neck fracture  Plan: NPO To OR today for left hip hemiarthroplasty Reviewed plan with her son Consent will be ordered and likely son will sign off due to her history of dementia Post op WBAT Dispo pending PT eval and assessment  [name] 01/14/2022, 7:38 AM

## 2022-01-14 NOTE — ED Notes (Signed)
Received verbal report from Brittany O RN at this time 

## 2022-01-14 NOTE — Plan of Care (Signed)

## 2022-01-14 NOTE — ED Notes (Signed)
Pt moved to room 40 at this time 

## 2022-01-14 NOTE — Assessment & Plan Note (Addendum)
-  s/p likely mechanic fall from increasing weakness and not using her walker -Left hip X-ray shows slightly comminuted subcapital femoral fracture on the left with superior subluxation of the distal fracture fragment. -Orthopedic is aware and will take to the OR in the morning.  Keep n.p.o. past midnight.

## 2022-01-14 NOTE — Anesthesia Preprocedure Evaluation (Addendum)
Anesthesia Evaluation  Patient identified by MRN, date of birth, ID band Patient awake    Reviewed: Allergy & Precautions, NPO status , Patient's Chart, lab work & pertinent test results  Airway Mallampati: II  TM Distance: >3 FB Neck ROM: Full    Dental  (+) Teeth Intact, Dental Advisory Given   Pulmonary    Pulmonary exam normal        Cardiovascular hypertension, Pt. on home beta blockers  Rhythm:Regular Rate:Normal  Echo: (2016) Left ventricle: The cavity size was normal. Wall thickness was  normal. Systolic function was vigorous. The estimated ejection  fraction was in the range of 65% to 70%. The study is not  technically sufficient to allow evaluation of LV diastolic  function.  - Left atrium: The atrium was normal in size.  - Right atrium: The atrium was normal in size.   Neuro/Psych PSYCHIATRIC DISORDERS Depression Dementia    GI/Hepatic Neg liver ROS, GERD  Medicated,  Endo/Other  Hypothyroidism   Renal/GU Renal InsufficiencyRenal disease     Musculoskeletal negative musculoskeletal ROS (+)   Abdominal Normal abdominal exam  (+)   Peds  Hematology   Anesthesia Other Findings   Reproductive/Obstetrics                           Anesthesia Physical Anesthesia Plan  ASA: 3  Anesthesia Plan: General   Post-op Pain Management:    Induction: Intravenous  PONV Risk Score and Plan: 4 or greater and Ondansetron and Treatment may vary due to age or medical condition  Airway Management Planned: Oral ETT  Additional Equipment: None  Intra-op Plan:   Post-operative Plan: Extubation in OR  Informed Consent: I have reviewed the patients History and Physical, chart, labs and discussed the procedure including the risks, benefits and alternatives for the proposed anesthesia with the patient or authorized representative who has indicated his/her understanding and acceptance.      Dental advisory given and Consent reviewed with POA  Plan Discussed with: CRNA and Anesthesiologist  Anesthesia Plan Comments: (Discussed with daughter. Prefers GA. )      Anesthesia Quick Evaluation

## 2022-01-14 NOTE — Assessment & Plan Note (Signed)
continue levothyroxine

## 2022-01-14 NOTE — Assessment & Plan Note (Signed)
creatinine of 1.32 up from 1.2. Continue IV fluids overnight and avoid nephrotoxic agents.

## 2022-01-14 NOTE — Op Note (Signed)
NAME:  Michelle Jones                ACCOUNT NO.:  [id number]   MEDICAL RECORD NO.: [id number]   LOCATION:  2297                         FACILITY:  [facility]   DATE OF BIRTH:  11/15/1929  PHYSICIAN:  Pietro Cassis. Alvan Dame, M.D.     DATE OF PROCEDURE:  01/14/2022                               OPERATIVE REPORT     PREOPERATIVE DIAGNOSIS:  left displaced femoral neck fracture.   POSTOPERATIVE DIAGNOSIS:  left displaced femoral neck fracture.   PROCEDURE:  left hip hemiarthroplasty utilizing DePuy component, size 3 standard  Actis stem with a 43 mm unipolar ball with a +0 adapter.   SURGEON:  Pietro Cassis. Alvan Dame, MD   ASSISTANT:  Costella Hatcher, PA-C.   ANESTHESIA:  General.   SPECIMENS:  None.   DRAINS: None   BLOOD LOSS:  250 cc.   COMPLICATIONS:  None.   INDICATION OF PROCEDURE:  Michelle Jones is a pleasant 88 year old female with dementia who lives in an assisted living environment.  She had an unwitnessed fall with pain and inability to get up and walk.  She was admitted to the hospital after radiographs revealed a femoral neck fracture.  She was seen and evaluated and was scheduled for surgery for fixation.  The necessity of surgical repair was discussed with her and her son.  Consent was obtained after reviewing risks of infection, DVT, component failure, and need for revision surgery.   PROCEDURE IN DETAIL:  The patient was brought to the operative theater. Once adequate anesthesia, preoperative antibiotics, 2 g of Ancef administered, the patient was positioned into the right lateral decubitus position with the left side up.  The left lower extremity was then prepped and draped in sterile fashion.  A time-out was performed identifying the patient, planned procedure, and extremity.   A lateral incision was made off the proximal trochanter. Sharp dissection was carried down to the iliotibial band and gluteal fascia. The gluteal fascia was then incised for posterior approach.  The  short external rotators were taken down separate from the posterior capsule. An L capsulotomy was made preserving the posterior leaflet for later anatomic repair. Fracture site was identified and after removing comminuted segments of the posterior femoral neck, the femoral head was removed without difficulty and measured on the back table  using the sizing rings and determined to be 43 mm in diameter.   The proximal femur was then exposed.  Retractors placed.  I then drilled, opened the proximal femur.  Then I hand reamed once and  Irrigated the canal to try to prevent fat emboli.  I began broaching the femur with a starter broach up to a size 3 broach with good medial and lateral metaphyseal fit without evidence of any torsion or movement.  A trial reduction was carried out with a standard offset neck and a +0 adapter with a 61m ball.  The hip reduced nicely.  The leg lengths appeared to be equal compared to the down leg.   The hip went through a range of motion without evidence of any subluxation or impingement.   Given these findings, the trial components removed.  The final 3 standard  Actis  stem was opened.  After irrigating the canal, the final stem was impacted and sat at the level where the broach was. Based on this and the trial reduction, a +0 adapter was opened and impacted in the 43 mm unipolar ball onto a clean and dry trunnion.  The hip had been irrigated throughout the case and again at this point.  I re- Approximated the posterior capsule to the superior leaflet using a  #1 Vicryl.  The remainder of the wound was closed with #1 Vicryl in the iliotibial band and gluteal fascia, a  2-0 Vicryl in the sub-Q tissue and a running 4-0 Monocryl in the skin.  The hip was cleaned, dried, and dressed sterilely using Dermabond and Aquacel dressing.  She was then brought to recovery room, extubated in stable condition, tolerating the procedure well.  Costella Hatcher, PA-C was  present and utilized as Environmental consultant for the entire case from  Preoperative positioning to management of the contralateral extremity and retractors to  General facilitation of the procedure.  He was also involved with primary wound closure.         Pietro Cassis Alvan Dame, M.D.

## 2022-01-14 NOTE — Anesthesia Procedure Notes (Signed)
Anesthesia Regional Block: Femoral nerve block   Pre-Anesthetic Checklist: , timeout performed,  Correct Patient, Correct Site, Correct Laterality,  Correct Procedure, Correct Position, site marked,  Risks and benefits discussed,  Surgical consent,  Pre-op evaluation,  At surgeon's request and post-op pain management  Laterality: Left  Prep: chloraprep       Needles:  Injection technique: Single-shot  Needle Type: Echogenic Needle     Needle Length: 9cm  Needle Gauge: 21     Additional Needles:   Procedures:,,,, ultrasound used (permanent image in chart),,    Narrative:  Start time: 01/14/2022 8:58 AM End time: 01/14/2022 9:08 AM Injection made incrementally with aspirations every 5 mL.  Performed by: Personally  Anesthesiologist: Santa Lighter, MD  Additional Notes: No pain on injection. No increased resistance to injection. Injection made in 5cc increments.  Good needle visualization.  Patient tolerated procedure well.

## 2022-01-14 NOTE — H&P (Addendum)
History and Physical    Patient: Michelle Jones TDD:[id number] DOB: [birth year] DOA: 01/13/2022 DOS: the patient was seen and examined on 01/14/2022 PCP: Maury Dus, MD  Patient coming from: [facility]  Chief Complaint:  Chief Complaint  Patient presents with   Hip Pain   HPI: [name] is a 88 y.o. female with medical history significant of dementia, HTN, HLD, hypothyroidism, anxiety and depression who presents with a fall.   Pt reports standing there talking and then just remember collapsing to the ground.Pt normally ambulates with a walker but didn't use it at the time.  Denies any dizziness, lightheadedness or chest pain, palpation. No recent illness, nausea, vomiting or diarrhea.   Only new medication change was that she was started on memantine on 5/17 but started to have insomnia, wondering around at night, hallucinations and more unsteady gait. Had a "tumble" last week. Medicine was discontinued last week sometime. She has been working with PT.   In the ED, Left hip X-ray shows slightly comminuted subcapital femoral fracture on the left with superior subluxation of the distal fracture fragment. She was otherwise afebrile, with hypertension of 170/60 and mild bradycardia 59.  No leukocytosis or anemia.  Has mild AKI with creatinine of 1.32 up from 1.2.  UA with trace leukocyte, negative nitrite and few bacteria and also squamous epithelial.  CT head and cervical spine is pending at the time of admission.  ED PA discussed with orthopedic Dr. Fredirick Lathe who will take to OR in the morning.   Review of Systems: As mentioned in the history of present illness. All other systems reviewed and are negative. Past Medical History:  Diagnosis Date   GERD (gastroesophageal reflux disease)    Hypertension    Past Surgical History:  Procedure Laterality Date   APPENDECTOMY     carpel tunnel release  2002   Dr. Herbie [place] Sypher   ERCP N/A 09/20/2018   Procedure: ENDOSCOPIC  RETROGRADE CHOLANGIOPANCREATOGRAPHY (ERCP);  Surgeon: Ronnette Juniper, MD;  Location: [facility];  Service: Gastroenterology;  Laterality: N/A;   PANCREATIC STENT PLACEMENT  09/20/2018   Procedure: PANCREATIC STENT PLACEMENT;  Surgeon: Ronnette Juniper, MD;  Location: [facility];  Service: Gastroenterology;;   REMOVAL OF STONES  09/20/2018   Procedure: REMOVAL OF STONES;  Surgeon: Ronnette Juniper, MD;  Location: [facility];  Service: Gastroenterology;;   Joan Mayans  09/20/2018   Procedure: Joan Mayans;  Surgeon: Ronnette Juniper, MD;  Location: [facility];  Service: Gastroenterology;;   TONSILLECTOMY  1940s   VAGINAL HYSTERECTOMY     ovaries left in place??? patient on premarin postop despite premenopausal age   Social History:  reports that she has never smoked. She has never used smokeless tobacco. She reports that she does not drink alcohol and does not use drugs.  Allergies  Allergen Reactions   Penicillins     Has patient had a PCN reaction causing immediate rash, facial/tongue/throat swelling, SOB or lightheadedness with hypotension: no throat swelling or SOB Has patient had a PCN reaction causing severe rash involving mucus membranes or skin necrosis: unknown, patients states she had sores on her mouth Has patient had a PCN reaction that required hospitalization: no Has patient had a PCN reaction occurring within the last 10 years: no If all of the above answers are "NO", then may proceed with Cephalosp    Family History  Problem Relation Age of Onset   Melanoma Mother    Breast cancer Sister    Diabetes Brother  Prior to Admission medications   Medication Sig Start Date End Date Taking? Authorizing Provider  amLODipine (NORVASC) 10 MG tablet Take 10 mg by mouth daily.    [provider]  buPROPion (WELLBUTRIN XL) 300 MG 24 hr tablet Take one tablet by mouth once daily for depression 10/03/14   [provider]  Coenzyme Q10 (CO Q 10) 100 MG CAPS Take 100 mg by  mouth daily.    [provider]  diclofenac Sodium (VOLTAREN) 1 % GEL Apply 4 g topically 4 (four) times daily. 09/28/21   Deno Etienne, DO  DULoxetine (CYMBALTA) 60 MG capsule Take one capsule by mouth daily for depression 10/03/14   [provider]  ferrous gluconate (FERGON) 225 (27 Fe) MG tablet Take 240 mg by mouth every morning.    [provider]  furosemide (LASIX) 20 MG tablet Take 20-40 mg by mouth daily as needed for fluid.     [provider]  hyoscyamine (LEVSIN, ANASPAZ) 0.125 MG tablet Take 0.125-0.25 mg by mouth every 4 (four) hours as needed for cramping.    [provider]  levothyroxine (SYNTHROID, LEVOTHROID) 100 MCG tablet Take one tablet by mouth every morning before breakfast for hypothyroidism 10/22/14   [provider]  lisinopril-hydrochlorothiazide (PRINZIDE,ZESTORETIC) 20-25 MG tablet Take 1 tablet by mouth daily.    [provider]  memantine (NAMENDA) 5 MG tablet . Take 1 tablet (5 mg at night) for 2 weeks, then increase to 1 tablet (5 mg) twice a day 12/26/21   Rondel Jumbo, PA-C  oxybutynin (DITROPAN) 5 MG tablet Take 5 mg by mouth every 8 (eight) hours as needed for bladder spasms.    [provider]  rosuvastatin (CRESTOR) 10 MG tablet Take 10 mg by mouth daily.    [provider]  tamsulosin (FLOMAX) 0.4 MG CAPS capsule Take 1 capsule (0.4 mg total) by mouth daily after supper. 09/21/18   Modena Jansky, MD    Physical Exam: Vitals:   01/14/22 0015 01/14/22 0030 01/14/22 0045 01/14/22 0100  BP: (!) 156/60 131/82 (!) 144/61 (!) 159/60  Pulse: 78 75 76 69  Resp: 19 19 (!) 22 14  Temp:      TempSrc:      SpO2: 91% 90% 92% 93%   Constitutional: NAD, calm, comfortable, elderly well-appearing female laying flat in bed Eyes:  lids and conjunctivae normal ENMT: Mucous membranes are moist.  Neck: normal, supple Respiratory: clear to auscultation bilaterally, no wheezing, no crackles.  Normal respiratory effort. No accessory muscle use.  Cardiovascular: Regular rate and rhythm, no murmurs / rubs / gallops. No extremity edema.   Abdomen: soft, no tenderness, Bowel sounds positive.  Musculoskeletal: no clubbing / cyanosis. No joint deformity upper and lower extremities.  Skin: no rashes, lesions, ulcers.  Neurologic: CN 2-12 grossly intact. Sensation intact, Strength 5/5 in upper extremity with good hand grip.  Psychiatric: Normal judgment and insight. Alert and oriented to self, family place and time but not current president. Normal mood. Data Reviewed:  See HPI  Assessment and Plan: * Closed left hip fracture ([place]) -s/p likely mechanic fall from increasing weakness and not using her walker -Left hip X-ray shows slightly comminuted subcapital femoral fracture on the left with superior subluxation of the distal fracture fragment. -Orthopedic is aware and will take to the OR in the morning.  Keep n.p.o. past midnight.  Dementia due to Alzheimer's disease [facility]) Patient is alert and oriented to self, family at bedside, place  and time.  Could not name current president.  AKI (acute kidney injury) ([place]) creatinine of 1.32 up from 1.2. Continue IV fluids overnight and avoid nephrotoxic agents.  Hypothyroidism continue levothyroxine  Essential hypertension Continue antihypertensives pending med rec      Advance Care Planning:   Code Status: Full Code    Consults: orthopedic   Family Communication: Discussed with son at bedside. He would like to be updated daily.  Severity of Illness: The appropriate patient status for this patient is INPATIENT. Inpatient status is judged to be reasonable and necessary in order to provide the required intensity of service to ensure the patient's safety. The patient's presenting symptoms, physical exam findings, and initial radiographic and laboratory data in the context of their chronic comorbidities is felt to place them at high risk  for further clinical deterioration. Furthermore, it is not anticipated that the patient will be medically stable for discharge from the hospital within 2 midnights of admission.   * I certify that at the point of admission it is my clinical judgment that the patient will require inpatient hospital care spanning beyond 2 midnights from the point of admission due to high intensity of service, high risk for further deterioration and high frequency of surveillance required.*  Author: Orene Desanctis, DO 01/14/2022 1:14 AM  For on call review [email or web address].

## 2022-01-14 NOTE — TOC CAGE-AID Note (Signed)
Transition of Care [facility]) - CAGE-AID Screening   Patient Details  Name: Michelle Jones MRN: [id number] Date of Birth: [birth year]  Transition of Care [facility]) CM/SW Contact:    Dmiya Malphrus C Tarpley-Carter, [place] Phone Number: 01/14/2022, 9:28 AM   Clinical Narrative: Pt is unable to participate in Cage Aid. Pt is experiencing dementia.  Kiernan Atkerson Tarpley-Carter, MSW, LCSW-A Pronouns:  She/Her/Hers [facility] Clinical Social Worker Direct Number:  [phone] [email or web address]  CAGE-AID Screening: Substance Abuse Screening unable to be completed due to: : Patient unable to participate

## 2022-01-14 NOTE — Anesthesia Postprocedure Evaluation (Signed)
Anesthesia Post Note  Patient: Michelle Jones  Procedure(s) Performed: AN AD HOC NERVE BLOCK     Patient location during evaluation: PACU Anesthesia Type: Regional Level of consciousness: awake and alert Pain management: pain level controlled Vital Signs Assessment: post-procedure vital signs reviewed and stable Respiratory status: spontaneous breathing Cardiovascular status: blood pressure returned to baseline Postop Assessment: no headache Anesthetic complications: no Comments: Regional block consult in PACU for left hip fracture.   No notable events documented.  Last Vitals:  Vitals:   01/14/22 0850 01/14/22 0912  BP:  (!) 169/91  Pulse: 67 69  Resp:  15  Temp:    SpO2:  98%    Last Pain:  Vitals:   01/14/22 0717  TempSrc:   PainSc: [place]

## 2022-01-14 NOTE — Progress Notes (Signed)
   Michelle Jones  ZOX:[id number] DOB: [birth year] DOA: 01/13/2022 PCP: Maury Dus, MD    Brief Narrative:  88 year old with a history of dementia, HTN, HLD, hypothyroidism, and anxiety/depression who fell when standing without the use of her usual walker.  In the ER x-rays noted a left comminuted subcapital femoral fracture with superior subluxation of the distal fracture fragment.  CT head and cervical spine were without acute findings.  Consultants:  Orthopedics  Goals of Care:  Code Status: Full Code   DVT prophylaxis: To be determined postoperatively by Orthopedics  Interim Hx: Afebrile.  Blood pressure modestly elevated.  Vital signs otherwise stable.  Appears to be resting comfortably at the time of my visit  Assessment & Plan:  Closed left hip fracture  Due to mechanical fall -2 OR at discretion of orthopedics  Dementia due to Alzheimer's disease   CKD stage IIIa - AKI ruled out  Review of records suggest baseline creatinine 1.1-1.2 with baseline GFR 46-53   Hypothyroidism continue levothyroxine  Essential hypertension Continue usual home medications  Family Communication: No family present at time of exam Disposition: Will depend upon performance with PT/OT postoperatively   Objective: Blood pressure (!) 169/91, pulse 69, temperature 97.6 F (36.4 C), temperature source Oral, resp. rate 15, SpO2 98 %. No intake or output data in the 24 hours ending 01/14/22 1044 There were no vitals filed for this visit.  Examination: Follow-up exam completed  CBC: Recent Labs  Lab 01/13/22 2340  WBC 8.3  NEUTROABS 6.0  HGB 12.5  HCT 38.9  MCV 103.7*  PLT 811   Basic Metabolic Panel: Recent Labs  Lab 01/13/22 2340  NA 139  K 3.8  CL 106  CO2 23  GLUCOSE 103*  BUN 22  CREATININE 1.32*  CALCIUM 9.0   GFR: CrCl cannot be calculated (Unknown ideal weight.).  Coagulation Profile: Recent Labs  Lab 01/13/22 2340  INR 1.0   Scheduled Meds:  levothyroxine   100 mcg Oral Q0600   senna  1 tablet Oral BID   Continuous Infusions:  lactated ringers 75 mL/hr at 01/14/22 0218     LOS: 0 days   Cherene Altes, MD [facility] Office  [phone] Pager - Text Page per Shea Evans  If 7PM-7AM, please contact night-coverage per Amion 01/14/2022, 10:44 AM

## 2022-01-14 NOTE — Consult Note (Signed)
Reason for Consult: left hip fracture Referring Physician: Thereasa Solo, MD  Michelle Jones is an 88 y.o. female.  HPI:  Michelle Jones is a 88 y.o. female with medical history significant of dementia, HTN, HLD, hypothyroidism, anxiety and depression who presents with a fall.    Pt reports standing there talking and then just remember collapsing to the ground.Pt normally ambulates with a walker but didn't use it at the time.  Denies any dizziness, lightheadedness or chest pain, palpation. No recent illness, nausea, vomiting or diarrhea.   Only new medication change was that she was started on memantine on 5/17 but started to have insomnia, wondering around at night, hallucinations and more unsteady gait. Had a "tumble" last week. Medicine was discontinued last week sometime. She has been working with PT.   Son in room this am No other concerns for pain on her extermities  Past Medical History:  Diagnosis Date   GERD (gastroesophageal reflux disease)    Hypertension     Past Surgical History:  Procedure Laterality Date   APPENDECTOMY     carpel tunnel release  2002   Dr. Herbie [place] Sypher   ERCP N/A 09/20/2018   Procedure: ENDOSCOPIC RETROGRADE CHOLANGIOPANCREATOGRAPHY (ERCP);  Surgeon: Ronnette Juniper, MD;  Location: [facility];  Service: Gastroenterology;  Laterality: N/A;   PANCREATIC STENT PLACEMENT  09/20/2018   Procedure: PANCREATIC STENT PLACEMENT;  Surgeon: Ronnette Juniper, MD;  Location: [facility];  Service: Gastroenterology;;   REMOVAL OF STONES  09/20/2018   Procedure: REMOVAL OF STONES;  Surgeon: Ronnette Juniper, MD;  Location: [facility];  Service: Gastroenterology;;   Joan Mayans  09/20/2018   Procedure: Joan Mayans;  Surgeon: Ronnette Juniper, MD;  Location: [facility];  Service: Gastroenterology;;   TONSILLECTOMY  1940s   VAGINAL HYSTERECTOMY     ovaries left in place??? patient on premarin postop despite premenopausal age    Family History  Problem Relation Age of Onset   Melanoma  Mother    Breast cancer Sister    Diabetes Brother     Social History:  reports that she has never smoked. She has never used smokeless tobacco. She reports that she does not drink alcohol and does not use drugs.  Allergies:  Allergies  Allergen Reactions   Penicillins     Has patient had a PCN reaction causing immediate rash, facial/tongue/throat swelling, SOB or lightheadedness with hypotension: no throat swelling or SOB Has patient had a PCN reaction causing severe rash involving mucus membranes or skin necrosis: unknown, patients states she had sores on her mouth Has patient had a PCN reaction that required hospitalization: no Has patient had a PCN reaction occurring within the last 10 years: no If all of the above answers are "NO", then may proceed with Cephalosp    Medications: I have reviewed the patient's current medications. Scheduled:  levothyroxine  100 mcg Oral Q0600   senna  1 tablet Oral BID    Results for orders placed or performed during the hospital encounter of 01/13/22 (from the past 24 hour(s))  Basic metabolic panel     Status: Abnormal   Collection Time: 01/13/22 11:40 PM  Result Value Ref Range   Sodium 139 135 - 145 mmol/L   Potassium 3.8 3.5 - 5.1 mmol/L   Chloride 106 98 - 111 mmol/L   CO2 23 22 - 32 mmol/L   Glucose, Bld 103 (H) 70 - 99 mg/dL   BUN 22 8 - 23 mg/dL   Creatinine, Ser 1.32 (H) 0.44 -  1.00 mg/dL   Calcium 9.0 8.9 - 10.3 mg/dL   GFR, Estimated 38 (L) >60 mL/min   Anion gap 10 5 - 15  CBC with Differential     Status: Abnormal   Collection Time: 01/13/22 11:40 PM  Result Value Ref Range   WBC 8.3 4.0 - 10.5 K/uL   RBC 3.75 (L) 3.87 - 5.11 MIL/uL   Hemoglobin 12.5 12.0 - 15.0 g/dL   HCT 38.9 36.0 - 46.0 %   MCV 103.7 (H) 80.0 - 100.0 fL   MCH 33.3 26.0 - 34.0 pg   MCHC 32.1 30.0 - 36.0 g/dL   RDW 12.4 11.5 - 15.5 %   Platelets 164 150 - 400 K/uL   nRBC 0.0 0.0 - 0.2 %   Neutrophils Relative % 73 %   Neutro Abs 6.0 1.7 - 7.7 K/uL    Lymphocytes Relative 18 %   Lymphs Abs 1.5 0.7 - 4.0 K/uL   Monocytes Relative 6 %   Monocytes Absolute 0.5 0.1 - 1.0 K/uL   Eosinophils Relative 2 %   Eosinophils Absolute 0.2 0.0 - 0.5 K/uL   Basophils Relative 0 %   Basophils Absolute 0.0 0.0 - 0.1 K/uL   Immature Granulocytes 1 %   Abs Immature Granulocytes 0.12 (H) 0.00 - 0.07 K/uL  Protime-INR     Status: None   Collection Time: 01/13/22 11:40 PM  Result Value Ref Range   Prothrombin Time 13.2 11.4 - 15.2 seconds   INR 1.0 0.8 - 1.2  Type and screen [place]     Status: None   Collection Time: 01/13/22 11:40 PM  Result Value Ref Range   ABO/RH(D) A POS    Antibody Screen NEG    Sample Expiration      01/16/2022,2359 Performed at [facility], [address], [place],  [id number]      X-rayJasmine DecemberDarletta Moll HIP (WITH OR WITHOUT PELVIS) 2-3V LEFT   COMPARISON:  10/11/2021.   FINDINGS: There is a slightly comminuted subcapital femoral fracture on the left with superior subluxation of the distal fracture fragment. The remaining bony structures are intact. Mild degenerative changes are noted at the hips bilaterally and in the lower lumbar spine. Surgical clips are present in the pelvis. Vascular calcifications are noted in the lower extremities bilaterally.   IMPRESSION: Slightly comminuted subcapital femoral fracture on the left with superior subluxation of the distal fracture fragment.     Electronically Signed   By: Brett Fairy M.D.  ROS: As per this note and admission H&P  Blood pressure (!) 150/66, pulse 75, temperature 97.6 F (36.4 C), temperature source Oral, resp. rate 16, SpO2 91 %.  Physical Exam: Constitutional: NAD, calm, comfortable, elderly well-appearing female laying flat in bed Eyes:  lids and conjunctivae normal ENMT: Mucous membranes are moist.  Neck: normal, supple Respiratory: clear to auscultation bilaterally, no wheezing, no crackles. Normal respiratory  effort. No accessory muscle use.  Cardiovascular: Regular rate and rhythm, no murmurs / rubs / gallops. No extremity edema.   Abdomen: soft, no tenderness, Bowel sounds positive.  Musculoskeletal: no clubbing / cyanosis. No joint deformity upper extremities.  LLE slightly shortened with external rotation NVI distally Currently holding her right leg in flexed position Skin: no rashes, lesions, ulcers.  Neurologic: CN 2-12 grossly intact. Sensation intact, Strength 5/5 in upper extremity with good hand grip.  Psychiatric: Normal judgment and insight. Alert and oriented to self, family place and time but not current  president. Normal mood.  Assessment/Plan: Left hip displaced femoral neck fracture  Plan: NPO To OR today for left hip hemiarthroplasty Reviewed plan with her son Consent will be ordered and likely son will sign off due to her history of dementia Post op WBAT Dispo pending PT eval and assessment  [name] 01/14/2022, 7:38 AM

## 2022-01-14 NOTE — Progress Notes (Signed)
Orthopedic Tech Progress Note Patient Details:  Michelle Jones [birth year] [id number]  Patient ID: Spero Geralds, female   DOB: 10-27-1929, 88 y.o.   MRN: [id number] Patient aged out of OHF use. Karolee Stamps 01/14/2022, 5:30 AM

## 2022-01-14 NOTE — ED Notes (Signed)
Pt went to [facility].

## 2022-01-14 NOTE — Anesthesia Pain Management Evaluation Note (Signed)
  Anesthesia Pain Consult Note  Patient: Michelle Jones, 88 y.o., female  Consult Requested by: Cherene Altes, MD  Reason for Consult: Left hip fracture  Level of Consciousness: alert  Pain: mild   Last Vitals:  Vitals:   01/14/22 0600 01/14/22 0850  BP: (!) 150/66   Pulse: 75 67  Resp: 16   Temp:    SpO2: 91%     Plan: Peripheral nerve block for pain control  Risks of wet tap, epidural hematoma and spinal cord injury explained to:   Consent:Risks of procedure as well as the alternatives and risks of each were explained to the (patient/caregiver).  Consent for procedure obtained. Telephone consent with patient's son.   Allergies  Allergen Reactions  . Nsaids Other (See Comments)    Unknown reaction - listed on [facility] 01/13/22  . Penicillins Other (See Comments)    Has patient had a PCN reaction causing immediate rash, facial/tongue/throat swelling, SOB or lightheadedness with hypotension: no throat swelling or SOB Has patient had a PCN reaction causing severe rash involving mucus membranes or skin necrosis: unknown, patients states she had sores on her mouth Has patient had a PCN reaction that required hospitalization: no Has patient had a PCN reaction occurring within the last 10 years: no If all of the above answers are "NO", then may proceed with Cephalosp  . Septra [Sulfamethoxazole-Trimethoprim] Other (See Comments)    Unknown reaction - listed on [facility] 01/13/2022    Physical exam: PULM normal  CARDIO Heart sounds are normal.  Regular rate and rhythm without murmur, gallop or rub.  OTHER    I have reviewed the patient's medications listed below. Marland Kitchen levothyroxine  100 mcg Oral Q0600  . senna  1 tablet Oral BID   . lactated ringers 75 mL/hr at 01/14/22 0218   HYDROcodone-acetaminophen, morphine injection  Past Medical History:  Diagnosis Date  . GERD (gastroesophageal reflux disease)   . Hypertension    Past Surgical History:  Procedure Laterality Date  .  APPENDECTOMY    . carpel tunnel release  2002   Dr. Theodis Sato  . ERCP N/A 09/20/2018   Procedure: ENDOSCOPIC RETROGRADE CHOLANGIOPANCREATOGRAPHY (ERCP);  Surgeon: Ronnette Juniper, MD;  Location: [facility];  Service: Gastroenterology;  Laterality: N/A;  . PANCREATIC STENT PLACEMENT  09/20/2018   Procedure: PANCREATIC STENT PLACEMENT;  Surgeon: Ronnette Juniper, MD;  Location: [facility];  Service: Gastroenterology;;  . REMOVAL OF STONES  09/20/2018   Procedure: REMOVAL OF STONES;  Surgeon: Ronnette Juniper, MD;  Location: [facility];  Service: Gastroenterology;;  . SPHINCTEROTOMY  09/20/2018   Procedure: Joan Mayans;  Surgeon: Ronnette Juniper, MD;  Location: [facility];  Service: Gastroenterology;;  . TONSILLECTOMY  1940s  . VAGINAL HYSTERECTOMY     ovaries left in place??? patient on premarin postop despite premenopausal age    reports that she has never smoked. She has never used smokeless tobacco. She reports that she does not drink alcohol and does not use drugs.    Santa Lighter 01/14/2022

## 2022-01-14 NOTE — Assessment & Plan Note (Addendum)
Continue antihypertensives pending med rec 

## 2022-01-14 NOTE — Interval H&P Note (Signed)
History and Physical Interval Note:  01/14/2022 4:56 PM  Michelle Jones  has presented today for surgery, with the diagnosis of Left femoral neck fracture..  The various methods of treatment have been discussed with the patient and family. After consideration of risks, benefits and other options for treatment, the patient has consented to  Procedure(s): HEMI HIP ARTHROPLASTY (Left) as a surgical intervention.  The patient's history has been reviewed, patient examined, no change in status, stable for surgery.  I have reviewed the patient's chart and labs.  Questions were answered to the patient's satisfaction.     [name]

## 2022-01-14 NOTE — Anesthesia Procedure Notes (Signed)
Procedure Name: Intubation Date/Time: 01/14/2022 5:33 PM Performed by: Moshe Salisbury, CRNA Pre-anesthesia Checklist: Patient identified, Emergency Drugs available, Suction available and Patient being monitored Patient Re-evaluated:Patient Re-evaluated prior to induction Oxygen Delivery Method: Circle System Utilized Preoxygenation: Pre-oxygenation with 100% oxygen Induction Type: IV induction Ventilation: Mask ventilation without difficulty Laryngoscope Size: Mac and 3 Grade View: Grade I Tube type: Oral Tube size: 7.5 mm Number of attempts: 1 Airway Equipment and Method: Stylet Placement Confirmation: ETT inserted through vocal cords under direct vision, positive ETCO2 and breath sounds checked- equal and bilateral Secured at: 20 cm Tube secured with: Tape Dental Injury: Teeth and Oropharynx as per pre-operative assessment

## 2022-01-14 NOTE — Anesthesia Postprocedure Evaluation (Signed)
Anesthesia Post Note  Patient: Michelle Jones  Procedure(s) Performed: HEMI HIP ARTHROPLASTY (Left: Hip)     Patient location during evaluation: PACU Anesthesia Type: General Level of consciousness: patient cooperative and confused Pain management: pain level controlled Vital Signs Assessment: post-procedure vital signs reviewed and stable Respiratory status: spontaneous breathing, nonlabored ventilation, respiratory function stable and patient connected to nasal cannula oxygen Cardiovascular status: blood pressure returned to baseline and stable Postop Assessment: no apparent nausea or vomiting Anesthetic complications: no   No notable events documented.  Last Vitals:  Vitals:   01/14/22 1845 01/14/22 1900  BP: (!) 168/70 123/75  Pulse: 65 63  Resp: 18 16  Temp: (!) 36.3 C   SpO2: 98% 100%    Last Pain:  Vitals:   01/14/22 1900  TempSrc:   PainSc: 0-No pain                 Kaedynce Tapp,E. Sahiti Joswick

## 2022-01-14 NOTE — Progress Notes (Signed)
Initial Nutrition Assessment  DOCUMENTATION CODES:   Not applicable  INTERVENTION:   Ensure Enlive po BID, each supplement provides 350 kcal and 20 grams of protein.  MVI with minerals daily.  NUTRITION DIAGNOSIS:   Increased nutrient needs related to hip fracture, post-op healing as evidenced by estimated needs.  GOAL:   Patient will meet greater than or equal to 90% of their needs  MONITOR:   PO intake, Supplement acceptance  REASON FOR ASSESSMENT:   Consult Hip fracture protocol  ASSESSMENT:   88 yo female admitted with L hip fracture S/P fall at ALF. PMH includes dementia, HTN, HLD, hypothyroidism, GERD.  6/5 - S/P L hip hemiarthroplasty Unable to speak with patient or complete NFPE at this time.  Patient would benefit from PO supplements to increase protein and calorie intake.   Labs reviewed. Medications reviewed and include Senokot.  Weight history reviewed.  Weight is down by 7% from 12/18/21 to 12/26/21. No new weight available this admission.  NUTRITION - FOCUSED PHYSICAL EXAM:  Unable to complete  Diet Order:   Diet Order             Diet NPO time specified Except for: Sips with Meds  Diet effective now                   EDUCATION NEEDS:   No education needs have been identified at this time  Skin:  Skin Assessment: Reviewed RN Assessment  Last BM:  6/4  Height:   Ht Readings from Last 1 Encounters:  12/26/21 '5\' 3"'$  (1.6 m)    Weight:   Wt Readings from Last 1 Encounters:  12/26/21 64.9 kg     BMI:  25.35  Estimated Nutritional Needs:   Kcal:  1750-1950  Protein:  85-95 gm  Fluid:  1.8-2 L    Lucas Mallow RD, LDN, CNSC Please refer to Amion for contact information.

## 2022-01-14 NOTE — Transfer of Care (Signed)
Immediate Anesthesia Transfer of Care Note  Patient: Michelle Jones  Procedure(s) Performed: HEMI HIP ARTHROPLASTY (Left: Hip)  Patient Location: PACU  Anesthesia Type:General  Level of Consciousness: drowsy and patient cooperative  Airway & Oxygen Therapy: Patient Spontanous Breathing and Patient connected to nasal cannula oxygen  Post-op Assessment: Report given to RN, Post -op Vital signs reviewed and stable and Patient moving all extremities  Post vital signs: Reviewed and stable  Last Vitals:  Vitals Value Taken Time  BP 168/70 01/14/22 1844  Temp    Pulse 67 01/14/22 1846  Resp 16 01/14/22 1846  SpO2 97 % 01/14/22 1846  Vitals shown include unvalidated device data.  Last Pain:  Vitals:   01/14/22 1601  TempSrc: Oral  PainSc: 0-No pain         Complications: No notable events documented.

## 2022-01-14 NOTE — Assessment & Plan Note (Signed)
Patient is alert and oriented to self, family at bedside, place and time.  Could not name current president.

## 2022-01-14 NOTE — Discharge Instructions (Signed)

## 2022-01-15 ENCOUNTER — Encounter (HOSPITAL_COMMUNITY): Payer: Self-pay | Admitting: Orthopedic Surgery

## 2022-01-15 DIAGNOSIS — N179 Acute kidney failure, unspecified: Secondary | ICD-10-CM | POA: Diagnosis not present

## 2022-01-15 DIAGNOSIS — S72002D Fracture of unspecified part of neck of left femur, subsequent encounter for closed fracture with routine healing: Secondary | ICD-10-CM | POA: Diagnosis not present

## 2022-01-15 DIAGNOSIS — I10 Essential (primary) hypertension: Secondary | ICD-10-CM | POA: Diagnosis not present

## 2022-01-15 DIAGNOSIS — G309 Alzheimer's disease, unspecified: Secondary | ICD-10-CM | POA: Diagnosis not present

## 2022-01-15 LAB — CBC
HCT: 33.4 % — ABNORMAL LOW (ref 36.0–46.0)
Hemoglobin: 10.6 g/dL — ABNORMAL LOW (ref 12.0–15.0)
MCH: 32.5 pg (ref 26.0–34.0)
MCHC: 31.7 g/dL (ref 30.0–36.0)
MCV: 102.5 fL — ABNORMAL HIGH (ref 80.0–100.0)
Platelets: 167 10*3/uL (ref 150–400)
RBC: 3.26 MIL/uL — ABNORMAL LOW (ref 3.87–5.11)
RDW: 12.2 % (ref 11.5–15.5)
WBC: 8.9 10*3/uL (ref 4.0–10.5)
nRBC: 0 % (ref 0.0–0.2)

## 2022-01-15 LAB — BASIC METABOLIC PANEL
Anion gap: 5 (ref 5–15)
Anion gap: 11 (ref 5–15)
BUN: 15 mg/dL (ref 8–23)
BUN: 19 mg/dL (ref 8–23)
CO2: 27 mmol/L (ref 22–32)
CO2: 24 mmol/L (ref 22–32)
Calcium: 8.8 mg/dL — ABNORMAL LOW (ref 8.9–10.3)
Calcium: 8.7 mg/dL — ABNORMAL LOW (ref 8.9–10.3)
Chloride: 107 mmol/L (ref 98–111)
Chloride: 103 mmol/L (ref 98–111)
Creatinine, Ser: 1.19 mg/dL — ABNORMAL HIGH (ref 0.44–1.00)
Creatinine, Ser: 1.23 mg/dL — ABNORMAL HIGH (ref 0.44–1.00)
GFR, Estimated: 43 mL/min — ABNORMAL LOW (ref >60)
GFR, Estimated: 42 mL/min — ABNORMAL LOW (ref >60)
Glucose, Bld: 135 mg/dL — ABNORMAL HIGH (ref 70–99)
Glucose, Bld: 110 mg/dL — ABNORMAL HIGH (ref 70–99)
Potassium: 5.3 mmol/L — ABNORMAL HIGH (ref 3.5–5.1)
Potassium: 4.1 mmol/L (ref 3.5–5.1)
Sodium: 139 mmol/L (ref 135–145)
Sodium: 138 mmol/L (ref 135–145)

## 2022-01-15 MED ORDER — HYDROCODONE-ACETAMINOPHEN 5-325 MG PO TABS: 1.0000 | ORAL_TABLET | Freq: Four times a day (QID) | ORAL | 0 refills | Status: DC | PRN

## 2022-01-15 MED ORDER — METHOCARBAMOL 500 MG PO TABS: 500.0000 mg | ORAL_TABLET | Freq: Three times a day (TID) | ORAL | 0 refills | Status: DC | PRN

## 2022-01-15 MED ORDER — ASPIRIN 81 MG PO CHEW: 81.0000 mg | CHEWABLE_TABLET | Freq: Two times a day (BID) | ORAL | 0 refills | Status: AC

## 2022-01-15 MED ORDER — ASPIRIN 81 MG PO CHEW: 81.0000 mg | CHEWABLE_TABLET | Freq: Two times a day (BID) | ORAL | 0 refills | Status: DC

## 2022-01-15 NOTE — TOC Initial Note (Signed)
Transition of Care [facility]) - Initial/Assessment Note    Patient Details  Name: Michelle Jones MRN: [id number] Date of Birth: 28-Oct-1929  Transition of [facility] CM/SW Contact:    Joanne Chars, LCSW Phone Number: 01/15/2022, 3:25 PM  Clinical Narrative:    CSW met with pt and son Alvester Chou to discuss DC recommendation for SNF.  Permission given to speak with Alvester Chou and with daughter Raquel Sarna.  Pt is from [place], in assisted living there.  They are agreeable to SNF, choice document given, permission given to send out referral in hub.  Pt is vaccinated for covid with one booster.                 Expected Discharge Plan: Skilled Nursing Facility Barriers to Discharge: Continued Medical Work up, SNF Pending bed offer   Patient Goals and CMS Choice Patient states their goals for this hospitalization and ongoing recovery are:: be able to walk CMS Medicare.gov Compare Post Acute Care list provided to:: Patient Represenative (must comment) Choice offered to / list presented to : Adult Children (son Alvester Chou)  Expected Discharge Plan and Services Expected Discharge Plan: [place] In-house Referral: Clinical Social Work   Post Acute Care Choice: [place] Living arrangements for the past 2 months: [place] ([place])                                      Prior Living Arrangements/Services Living arrangements for the past 2 months: [place] ([place]) Lives with:: Facility Resident Patient language and need for interpreter reviewed:: Yes Do you feel safe going back to the place where you live?: Yes      Need for Family Participation in Patient Care: Yes (Comment) Care giver support system in place?: Yes (comment) Current home services: Other (comment) (PT at ALF) Criminal Activity/Legal Involvement Pertinent to Current Situation/Hospitalization: No - Comment as needed  Activities of Daily Living       Permission Sought/Granted Permission sought to share information with : Family Supports Permission granted to share information with : Yes, Verbal Permission Granted  Share Information with NAME: son Alvester Chou, daughter Raquel Sarna  Permission granted to share info w AGENCY: SNF        Emotional Assessment Appearance:: Appears stated age Attitude/Demeanor/Rapport: Engaged Affect (typically observed): Appropriate, Pleasant Orientation: : Oriented to Self, Oriented to Place, Oriented to  Time, Oriented to Situation      Admission diagnosis:  Closed left hip fracture ([place]) [S72.002A] Closed fracture of left hip, initial encounter [facility]) [S72.002A] Patient Active Problem List   Diagnosis Date Noted   Closed left hip fracture ([place]) 01/14/2022   S/P left hip hemiarthroplasty 01/14/2022   Dementia due to Alzheimer's disease ([place]) 12/26/2021   Choledocholithiasis 09/18/2018   Right ureteral stone 09/18/2018   Elevated LFTs 09/18/2018   Calculus of bile duct without cholecystitis with obstruction    UTI (urinary tract infection) 05/09/2015   Essential hypertension 05/09/2015   Hypothyroidism 05/09/2015   Depression 05/09/2015   AKI (acute kidney injury) ([place]) 05/09/2015   Thrombocytopenia ([place]) 05/09/2015   Hypokalemia 05/09/2015   Elevated troponin 05/09/2015   Anemia 05/09/2015   E. coli sepsis ([place]) 05/09/2015   Sepsis ([place]) 05/05/2015   PCP:  Maury Dus, MD Pharmacy:  No Pharmacies Listed    Social Determinants of Health (SDOH) Interventions    Readmission Risk Interventions  View : No data to display.

## 2022-01-15 NOTE — Progress Notes (Signed)
Patient BP 127/45. Held 10:00 metoprolol dose per Dr Karleen Hampshire. Will continue to monitor.

## 2022-01-15 NOTE — NC FL2 (Signed)
[place] MEDICAID FL2 LEVEL OF CARE SCREENING TOOL     IDENTIFICATION  Patient Name: Michelle Jones Birthdate: [birth year] Sex: female Admission Date (Current Location): 01/13/2022  [facility] and [place] Number:  Herbalist and Address:  [facility] [place], [place] [address], [place], [place] [address]      Provider Number: [id number]  Attending Physician Name and Address:  Hosie Poisson, MD  Relative Name and Phone Number:  Bishop,Emily Daughter [phone]    Current Level of Care: Hospital Recommended Level of Care: [place] Prior Approval Number:    Date Approved/Denied:   PASRR Number: [id number]  Discharge Plan: SNF    Current Diagnoses: Patient Active Problem List   Diagnosis Date Noted   Closed left hip fracture ([place]) 01/14/2022   S/P left hip hemiarthroplasty 01/14/2022   Dementia due to Alzheimer's disease ([place]) 12/26/2021   Choledocholithiasis 09/18/2018   Right ureteral stone 09/18/2018   Elevated LFTs 09/18/2018   Calculus of bile duct without cholecystitis with obstruction    UTI (urinary tract infection) 05/09/2015   Essential hypertension 05/09/2015   Hypothyroidism 05/09/2015   Depression 05/09/2015   AKI (acute kidney injury) ([place]) 05/09/2015   Thrombocytopenia ([place]) 05/09/2015   Hypokalemia 05/09/2015   Elevated troponin 05/09/2015   Anemia 05/09/2015   E. coli sepsis ([place]) 05/09/2015   Sepsis ([place]) 05/05/2015    Orientation RESPIRATION BLADDER Height & Weight     Self, Time, Situation, Place  O2 Incontinent Weight:   Height:     BEHAVIORAL SYMPTOMS/MOOD NEUROLOGICAL BOWEL NUTRITION STATUS      Continent Diet (see discharge summary)  AMBULATORY STATUS COMMUNICATION OF NEEDS Skin   Limited Assist Verbally Surgical wounds                       Personal Care Assistance Level of Assistance  Bathing, Feeding, Dressing Bathing Assistance: Limited assistance Feeding assistance: Limited  assistance Dressing Assistance: Limited assistance     Functional Limitations Info  Sight, Hearing, Speech Sight Info: Adequate Hearing Info: Adequate Speech Info: Adequate    SPECIAL CARE FACTORS FREQUENCY  PT (By licensed PT), OT (By licensed OT)     PT Frequency: 5x week OT Frequency: 5x week            Contractures Contractures Info: Not present    Additional Factors Info  Code Status, Allergies Code Status Info: full Allergies Info: Nsaids, Penicillins, Septra (Sulfamethoxazole-trimethoprim)           Current Medications (01/15/2022):  This is the current hospital active medication list Current Facility-Administered Medications  Medication Dose Route Frequency Provider Last Rate Last Admin   0.9 %  sodium chloride infusion   Intravenous Continuous Irving Copas, PA-C 75 mL/hr at 01/14/22 2214 New Bag at 01/14/22 2214   acetaminophen (TYLENOL) tablet 325-650 mg  325-650 mg Oral Q6H PRN Irving Copas, PA-C       acetaminophen (TYLENOL) tablet 500 mg  500 mg Oral Q6H Irving Copas, PA-C   500 mg at 01/15/22 1235   aspirin chewable tablet 81 mg  81 mg Oral BID Irving Copas, PA-C   81 mg at 01/15/22 1042   bisacodyl (DULCOLAX) suppository 10 mg  10 mg Rectal Daily PRN Irving Copas, PA-C       buPROPion (WELLBUTRIN XL) 24 hr tablet 300 mg  300 mg Oral q morning Irving Copas, PA-C   300 mg at 01/15/22 1042  Chlorhexidine Gluconate Cloth 2 % PADS 6 each  6 each Topical Q0600 Paralee Cancel, MD   6 each at 01/15/22 0529   diphenhydrAMINE (BENADRYL) 12.5 MG/5ML elixir 12.5-25 mg  12.5-25 mg Oral Q4H PRN Irving Copas, PA-C       docusate sodium (COLACE) capsule 100 mg  100 mg Oral BID Irving Copas, PA-C   100 mg at 01/15/22 1042   DULoxetine (CYMBALTA) DR capsule 60 mg  60 mg Oral q morning Irving Copas, PA-C   60 mg at 01/15/22 1042   feeding supplement (ENSURE ENLIVE / ENSURE PLUS) liquid 237 mL  237 mL Oral TID BM Irving Copas,  PA-C   237 mL at 01/15/22 1400   ferrous sulfate tablet 325 mg  325 mg Oral TID PC Irving Copas, PA-C   325 mg at 01/15/22 1238   HYDROcodone-acetaminophen (NORCO/VICODIN) 5-325 MG per tablet 1-2 tablet  1-2 tablet Oral Q4H PRN Irving Copas, PA-C       levothyroxine (SYNTHROID) tablet 100 mcg  100 mcg Oral Q0600 Irving Copas, PA-C   100 mcg at 01/15/22 0528   memantine (NAMENDA) tablet 5 mg  5 mg Oral QHS Irving Copas, PA-C   5 mg at 01/14/22 2215   menthol-cetylpyridinium (CEPACOL) lozenge 3 mg  1 lozenge Oral PRN Irving Copas, PA-C       Or   phenol (CHLORASEPTIC) mouth spray 1 spray  1 spray Mouth/Throat PRN Irving Copas, PA-C       methocarbamol (ROBAXIN) tablet 500 mg  500 mg Oral Q6H PRN Irving Copas, PA-C       Or   methocarbamol (ROBAXIN) 500 mg in dextrose 5 % 50 mL IVPB  500 mg Intravenous Q6H PRN Irving Copas, PA-C       metoCLOPramide (REGLAN) tablet 5-10 mg  5-10 mg Oral Q8H PRN Irving Copas, PA-C       Or   metoCLOPramide (REGLAN) injection 5-10 mg  5-10 mg Intravenous Q8H PRN Irving Copas, PA-C       metoprolol succinate (TOPROL-XL) 24 hr tablet 100 mg  100 mg Oral q morning Irving Copas, PA-C   100 mg at 01/14/22 1310   morphine (PF) 2 MG/ML injection 0.5 mg  0.5 mg Intravenous Q2H PRN Irving Copas, PA-C   0.5 mg at 01/14/22 0501   multivitamin with minerals tablet 1 tablet  1 tablet Oral Daily Irving Copas, PA-C   1 tablet at 01/15/22 1042   mupirocin ointment (BACTROBAN) 2 % 1 application.  1 application. Nasal BID Paralee Cancel, MD   1 application. at 01/15/22 1043   ondansetron (ZOFRAN) tablet 4 mg  4 mg Oral Q6H PRN Irving Copas, PA-C       Or   ondansetron [facility]) injection 4 mg  4 mg Intravenous Q6H PRN Irving Copas, PA-C       oxybutynin (DITROPAN) tablet 5 mg  5 mg Oral BID Irving Copas, PA-C   5 mg at 01/15/22 1042   polyethylene glycol (MIRALAX / GLYCOLAX) packet 17 g  17 g Oral Daily PRN  Irving Copas, PA-C       [START ON 01/18/2022] rosuvastatin (CRESTOR) tablet 10 mg  10 mg Oral Q Fri Irving Copas, PA-C       senna (SENOKOT) tablet 8.6 mg  1 tablet Oral BID Irving Copas, PA-C   8.6 mg at 01/15/22 1042  Discharge Medications: Please see discharge summary for a list of discharge medications.  Relevant Imaging Results:  Relevant Lab Results:   Additional Information SSN: [id number], pt is vaccinated for covid, reports one booster.  Joanne Chars, LCSW

## 2022-01-15 NOTE — Evaluation (Addendum)
Physical Therapy Evaluation Patient Details Name: [name] MRN: [id number] DOB: 09-07-1929 Today's Date: 01/15/2022  History of Present Illness  Michelle Jones is a 88 y.o. female with medical history significant of dementia, HTN, HLD, hypothyroidism, anxiety and depression who presents with a fall. S/P L femur fx, s/p L hemiarthroplasty. WBAT, posterior hip precautions.   Clinical Impression  Patient received in bed, she is pleasant and agrees to PT. Son at bedside. She requires mod A for all mobility at this time. Pain limited. She will continue to benefit from skilled PT to improve safety and functional independence for return to baseline.        Recommendations for follow up therapy are one component of a multi-disciplinary discharge planning process, led by the attending physician.  Recommendations may be updated based on patient status, additional functional criteria and insurance authorization.  Follow Up Recommendations Skilled nursing-short term rehab (<3 hours/day)    Assistance Recommended at Discharge Frequent or constant Supervision/Assistance  Patient can return home with the following  A lot of help with walking and/or transfers;A lot of help with bathing/dressing/bathroom;Assist for transportation;Help with stairs or ramp for entrance;Assistance with cooking/housework;Direct supervision/assist for financial management;Direct supervision/assist for medications management    Equipment Recommendations None recommended by PT  Recommendations for Other Services       Functional Status Assessment Patient has had a recent decline in their functional status and demonstrates the ability to make significant improvements in function in a reasonable and predictable amount of time.     Precautions / Restrictions Precautions Precautions: Fall Restrictions Weight Bearing Restrictions: Yes LLE Weight Bearing: Weight bearing as tolerated      Mobility  Bed Mobility Overal  bed mobility: Needs Assistance Bed Mobility: Supine to Sit     Supine to sit: Mod assist, HOB elevated     General bed mobility comments: assist to move L LE in bed, mod A to raise trunk and scoot hips to edge of bed    Transfers Overall transfer level: Needs assistance Equipment used: Rolling walker (2 wheels) Transfers: Sit to/from Stand Sit to Stand: Mod assist, From elevated surface           General transfer comment: mod assist to power up and achieve standing balance    Ambulation/Gait Ambulation/Gait assistance: Mod assist Gait Distance (Feet): 4 Feet Assistive device: Rolling walker (2 wheels) Gait Pattern/deviations: Step-to pattern, Decreased step length - right, Decreased step length - left, Decreased stance time - left Gait velocity: decr     General Gait Details: posterior leaning in standing. Cues for sequencing needed. Assistance to manage RW  Stairs            Wheelchair Mobility    Modified Rankin (Stroke Patients Only)       Balance Overall balance assessment: Needs assistance, History of Falls Sitting-balance support: Feet supported Sitting balance-Leahy Scale: Fair Sitting balance - Comments: keeps L LE internally rotated Postural control: Posterior lean, Right lateral lean Standing balance support: Bilateral upper extremity supported, During functional activity, Reliant on assistive device for balance Standing balance-Leahy Scale: Poor Standing balance comment: poor balance, posterior leaning                             Pertinent Vitals/Pain Pain Assessment Pain Assessment: Faces Faces Pain Scale: Hurts even more Pain Location: L LE Pain Descriptors / Indicators: Discomfort, Grimacing, Guarding, Moaning Pain Intervention(s): Limited activity within patient's tolerance, Monitored during  session, Repositioned, Premedicated before session    Home Living Family/patient expects to be discharged to:: Skilled nursing  facility                        Prior Function Prior Level of Function : Independent/Modified Independent             Mobility Comments: patient just moved to assisted living, uses rollator at baseline ADLs Comments: mod I     Hand Dominance        Extremity/Trunk Assessment   Upper Extremity Assessment Upper Extremity Assessment: Defer to OT evaluation    Lower Extremity Assessment Lower Extremity Assessment: LLE deficits/detail LLE: Unable to fully assess due to pain LLE Coordination: decreased gross motor    Cervical / Trunk Assessment Cervical / Trunk Assessment: Normal  Communication   Communication: No difficulties  Cognition Arousal/Alertness: Awake/alert Behavior During Therapy: WFL for tasks assessed/performed Overall Cognitive Status: Within Functional Limits for tasks assessed                                          General Comments      Exercises     Assessment/Plan    PT Assessment Patient needs continued PT services  PT Problem List Decreased strength;Decreased mobility;Decreased safety awareness;Decreased activity tolerance;Decreased range of motion;Decreased knowledge of precautions;Decreased balance;Decreased cognition;Decreased knowledge of use of DME;Pain       PT Treatment Interventions DME instruction;Therapeutic exercise;Gait training;Balance training;Stair training;Functional mobility training;Therapeutic activities;Patient/family education;Cognitive remediation;Neuromuscular re-education    PT Goals (Current goals can be found in the Care Plan section)  Acute Rehab PT Goals Patient Stated Goal: to go to rehab then back to ALF PT Goal Formulation: With patient/family Time For Goal Achievement: 01/29/22 Potential to Achieve Goals: Good    Frequency Min 3X/week     Co-evaluation               AM-PAC PT "6 Clicks" Mobility  Outcome Measure Help needed turning from your back to your side while in  a flat bed without using bedrails?: A Lot Help needed moving from lying on your back to sitting on the side of a flat bed without using bedrails?: A Lot Help needed moving to and from a bed to a chair (including a wheelchair)?: A Lot Help needed standing up from a chair using your arms (e.g., wheelchair or bedside chair)?: A Lot Help needed to walk in hospital room?: A Lot Help needed climbing 3-5 steps with a railing? : Total 6 Click Score: 11    End of Session Equipment Utilized During Treatment: Gait belt;Oxygen Activity Tolerance: Patient limited by pain;Patient limited by fatigue Patient left: in chair;with chair alarm set;with call bell/phone within reach;with family/visitor present Nurse Communication: Mobility status PT Visit Diagnosis: Unsteadiness on feet (R26.81);Other abnormalities of gait and mobility (R26.89);Muscle weakness (generalized) (M62.81);Repeated falls (R29.6);Difficulty in walking, not elsewhere classified (R26.2);Pain Pain - Right/Left: Left Pain - part of body: Leg    Time: 5176-1607 PT Time Calculation (min) (ACUTE ONLY): 30 min   Charges:   PT Evaluation $PT Eval Moderate Complexity: 1 Mod PT Treatments $Gait Training: 8-22 mins        Suzy Kugel, PT, GCS 01/15/22,1:02 PM

## 2022-01-15 NOTE — Plan of Care (Signed)

## 2022-01-15 NOTE — Progress Notes (Signed)
   Subjective: 1 Day Post-Op Procedure(s) (LRB): HEMI HIP ARTHROPLASTY (Left) Patient reports pain as mild.   Patient seen in rounds for Dr. Alvan Dame. Patient is resting in bed on exam this morning. No acute events overnight. Denies pain at rest. Voiding without difficulty. She tells me she recently moved into an assisted living facility.  We will start therapy today.   Objective: Vital signs in last 24 hours: Temp:  [97.4 F (36.3 C)-98.4 F (36.9 C)] 97.7 F (36.5 C) (06/06 0432) Pulse Rate:  [61-89] 70 (06/06 0432) Resp:  [14-19] 17 (06/06 0432) BP: (105-176)/(40-104) 105/81 (06/06 0432) SpO2:  [90 %-100 %] 90 % (06/06 0600)  Intake/Output from previous day:  Intake/Output Summary (Last 24 hours) at 01/15/2022 0716 Last data filed at 01/14/2022 1915 Gross per 24 hour  Intake 1050 ml  Output 250 ml  Net 800 ml     Intake/Output this shift: No intake/output data recorded.  Labs: Recent Labs    01/13/22 2340 01/15/22 0219  HGB 12.5 10.6*   Recent Labs    01/13/22 2340 01/15/22 0219  WBC 8.3 8.9  RBC 3.75* 3.26*  HCT 38.9 33.4*  PLT 164 167   Recent Labs    01/13/22 2340 01/15/22 0219  NA 139 139  K 3.8 5.3*  CL 106 107  CO2 23 27  BUN 22 15  CREATININE 1.32* 1.19*  GLUCOSE 103* 135*  CALCIUM 9.0 8.8*   Recent Labs    01/13/22 2340  INR 1.0    Exam: General - Patient is Alert and Appropriate Extremity - Neurologically intact Sensation intact distally Intact pulses distally Dorsiflexion/Plantar flexion intact Dressing - dressing C/D/I Motor Function - intact, moving foot and toes well on exam.   Past Medical History:  Diagnosis Date   GERD (gastroesophageal reflux disease)    Hypertension     Assessment/Plan: 1 Day Post-Op Procedure(s) (LRB): HEMI HIP ARTHROPLASTY (Left) Principal Problem:   Closed left hip fracture (HCC) Active Problems:   Essential hypertension   Hypothyroidism   AKI (acute kidney injury) ([place])   Dementia due to  Alzheimer's disease (HCC)   S/P left hip hemiarthroplasty  Estimated body mass index is 25.33 kg/m as calculated from the following:   Height as of 12/26/21: '5\' 3"'$  (1.6 m).   Weight as of 12/26/21: 64.9 kg. Advance diet Up with therapy  DVT Prophylaxis - Aspirin Weight bearing as tolerated.  ABLA: Hgb stable at 10.6 this AM.   Plan to get up with PT today to work on mobilization.   Rx printed in case of d/c to facility.   Griffith Citron, PA-C Orthopedic Surgery [phone] 01/15/2022, 7:16 AM

## 2022-01-15 NOTE — Progress Notes (Signed)
[facility]                                                                               Tranesha Lessner, is a 88 y.o. female, DOB - 1930/02/08, HMC:[id number] Admit date - 01/13/2022    Outpatient Primary MD for the patient is Maury Dus, MD  LOS - 1  days    Brief summary    88 year old with a history of dementia, HTN, HLD, hypothyroidism, and anxiety/depression who fell when standing without the use of her usual walker.  In the ER x-rays noted a left comminuted subcapital femoral fracture with superior subluxation of the distal fracture fragment.  CT head and cervical spine were without acute findings. She underwent left hip hemiarthroplasty  by Dr Alvan Dame on 6.5.2023. therapy sessions to start today.   Assessment & Plan    Assessment and Plan: * Closed left hip fracture (HCC) -s/p likely mechanic fall from increasing weakness and not using her walker -Left hip X-ray shows slightly comminuted subcapital femoral fracture on the left with superior subluxation of the distal fracture fragment. -She underwent left hip hemiarthroplasty  by Dr Alvan Dame on 6.5.2023. therapy sessions to start today.  - pain control with IV morphine, oral vicodin and robaxin.   Dementia due to Alzheimer's disease ([place]) No behavioral abnormalities.  Alert and oriented to place and person.   AKI (acute kidney injury) ([place]) Creatinine improved to 1.1 with IV fluids.   Hyperkalemia Recheck K as pt's sample is hemolyzed.   Hypothyroidism continue levothyroxine  Essential hypertension BP parameters are optimal.     Acute anemia of blood loss;  Hemoglobin dropped from 12 to 10. 6        Malnutrition Type:  Nutrition Problem: Increased nutrient needs Etiology: hip fracture, post-op healing   Malnutrition Characteristics:  Signs/Symptoms: estimated needs   Nutrition Interventions:  Interventions: Ensure Enlive (each supplement provides 350kcal and 20 grams of protein),  MVI  Estimated body mass index is 25.33 kg/m as calculated from the following:   Height as of 12/26/21: '5\' 3"'$  (1.6 m).   Weight as of 12/26/21: 64.9 kg.  Code Status: full code.  DVT Prophylaxis:  SCDs Start: 01/14/22 2040 Place TED hose Start: 01/14/22 2040 SCDs Start: 01/14/22 0039   Level of Care: Level of care: Telemetry Medical Family Communication: son at bedside. Updated CSW and son.   Disposition Plan:     Remains inpatient appropriate:  Pending THERAPY EVAL.   Procedures:  She underwent left hip hemiarthroplasty  by Dr Alvan Dame on 6.5.2023.   Consultants:   ORTHOPEDICS.   Antimicrobials:   Anti-infectives (From admission, onward)    Start     Dose/Rate Route Frequency Ordered Stop   01/15/22 0600  ceFAZolin (ANCEF) IVPB 2g/100 mL premix  Status:  Discontinued        2 g 200 mL/hr over 30 Minutes Intravenous On call to O.R. 01/14/22 1848 01/14/22 1959   01/14/22 2330  ceFAZolin (ANCEF) IVPB 2g/100 mL premix        2 g 200 mL/hr over 30 Minutes Intravenous Every 6 hours 01/14/22 2039 01/15/22 1129   01/14/22 1537  ceFAZolin (ANCEF) 2-4 GM/100ML-% IVPB  Note to Pharmacy: Alycia Patten: cabinet override      01/14/22 1537 01/15/22 0344        Medications  Scheduled Meds:  acetaminophen  500 mg Oral Q6H   aspirin  81 mg Oral BID   buPROPion  300 mg Oral q morning   Chlorhexidine Gluconate Cloth  6 each Topical Q0600   docusate sodium  100 mg Oral BID   DULoxetine  60 mg Oral q morning   feeding supplement  237 mL Oral TID BM   ferrous sulfate  325 mg Oral TID PC   levothyroxine  100 mcg Oral Q0600   memantine  5 mg Oral QHS   metoprolol succinate  100 mg Oral q morning   multivitamin with minerals  1 tablet Oral Daily   mupirocin ointment  1 application. Nasal BID   oxybutynin  5 mg Oral BID   [START ON 01/18/2022] rosuvastatin  10 mg Oral Q Fri   senna  1 tablet Oral BID   Continuous Infusions:  sodium chloride 75 mL/hr at 01/14/22 2214    ceFAZolin  (ANCEF) IV 2 g (01/15/22 0500)   methocarbamol (ROBAXIN) IV     PRN Meds:.acetaminophen, bisacodyl, diphenhydrAMINE, HYDROcodone-acetaminophen, menthol-cetylpyridinium **OR** phenol, methocarbamol **OR** methocarbamol (ROBAXIN) IV, metoCLOPramide **OR** metoCLOPramide (REGLAN) injection, morphine injection, ondansetron **OR** ondansetron (ZOFRAN) IV, polyethylene glycol    Subjective:   Kissa Bibian was seen and examined today.  No new complaints Objective:   Vitals:   01/15/22 0022 01/15/22 0432 01/15/22 0600 01/15/22 0838  BP: (!) 115/40 105/81  (!) 127/45  Pulse: 89 70  65  Resp: '14 17  17  '$ Temp: 97.7 F (36.5 C) 97.7 F (36.5 C)  97.8 F (36.6 C)  TempSrc: Axillary Oral  Oral  SpO2: 92% 93% 90% 94%    Intake/Output Summary (Last 24 hours) at 01/15/2022 1048 Last data filed at 01/14/2022 1915 Gross per 24 hour  Intake 1050 ml  Output 250 ml  Net 800 ml   There were no vitals filed for this visit.   Exam General exam: Appears calm and comfortable  Respiratory system: Clear to auscultation. Respiratory effort normal. Cardiovascular system: S1 & S2 heard, RRR. No JVD,  No pedal edema. Gastrointestinal system: Abdomen is nondistended, soft and nontender. Normal bowel sounds heard. Central nervous system: Alert and oriented. No focal neurological deficits. Extremities:  painful rom of the LLE.  Skin: No rashes, lesions or ulcers Psychiatry: . Mood & affect appropriate.    Data Reviewed:  I have personally reviewed following labs and imaging studies   CBC Lab Results  Component Value Date   WBC 8.9 01/15/2022   RBC 3.26 (L) 01/15/2022   HGB 10.6 (L) 01/15/2022   HCT 33.4 (L) 01/15/2022   MCV 102.5 (H) 01/15/2022   MCH 32.5 01/15/2022   PLT 167 01/15/2022   MCHC 31.7 01/15/2022   RDW 12.2 01/15/2022   LYMPHSABS 1.5 01/13/2022   MONOABS 0.5 01/13/2022   EOSABS 0.2 01/13/2022   BASOSABS 0.0 09/32/6712     Last metabolic panel Lab Results  Component Value  Date   NA 139 01/15/2022   K 5.3 (H) 01/15/2022   CL 107 01/15/2022   CO2 27 01/15/2022   BUN 15 01/15/2022   CREATININE 1.19 (H) 01/15/2022   GLUCOSE 135 (H) 01/15/2022   GFRNONAA 43 (L) 01/15/2022   GFRAA 53 (L) 09/20/2018   CALCIUM 8.8 (L) 01/15/2022   PHOS 2.3 (L) 05/07/2015   PROT 6.4 (L)  12/18/2021   ALBUMIN 3.7 12/18/2021   BILITOT 0.4 12/18/2021   ALKPHOS 37 (L) 12/18/2021   AST 18 12/18/2021   ALT 16 12/18/2021   ANIONGAP 5 01/15/2022    CBG (last 3)  No results for input(s): GLUCAP in the last 72 hours.    Coagulation Profile: Recent Labs  Lab 01/13/22 2340  INR 1.0     Radiology Studies: DG Chest 1 View  Result Date: 01/13/2022 CLINICAL DATA:  Fall. EXAM: CHEST  1 VIEW COMPARISON:  12/01/2015. FINDINGS: The heart size and mediastinal contours are within normal limits. There is atherosclerotic calcification of the aorta. No consolidation, effusion, or pneumothorax. No acute osseous abnormality. Surgical clips are present in the right upper quadrant. IMPRESSION: No active disease. Electronically Signed   By: Brett Fairy M.D.   On: 01/13/2022 23:40   CT HEAD WO CONTRAST  Result Date: 01/14/2022 CLINICAL DATA:  Mechanical fall EXAM: CT HEAD WITHOUT CONTRAST CT CERVICAL SPINE WITHOUT CONTRAST TECHNIQUE: Multidetector CT imaging of the head and cervical spine was performed following the standard protocol without intravenous contrast. Multiplanar CT image reconstructions of the cervical spine were also generated. RADIATION DOSE REDUCTION: This exam was performed according to the departmental dose-optimization program which includes automated exposure control, adjustment of the mA and/or kV according to patient size and/or use of iterative reconstruction technique. COMPARISON:  CT brain 12/18/2021 FINDINGS: CT HEAD FINDINGS Brain: No acute territorial infarction, hemorrhage or intracranial mass. Advanced atrophy. Mild chronic small vessel ischemic changes of the white  matter. Stable ventricle size Vascular: No hyperdense vessels. Vertebral and carotid vascular calcification Skull: No fracture Sinuses/Orbits: No acute finding. Other: None CT CERVICAL SPINE FINDINGS Alignment: Mild reversal of cervical lordosis. Trace anterolisthesis C3 on C4 and C4 on C5. Facet alignment is maintained. Skull base and vertebrae: Minimal superior endplate deformity at T1 and T2 of uncertain chronicity. Cervical vertebral bodies show no fracture Soft tissues and spinal canal: No prevertebral fluid or swelling. No visible canal hematoma. Disc levels: Advanced multilevel degenerative change C4 through C7. Facet degenerative changes at multiple levels with foraminal narrowing Upper chest: Negative. Other: None IMPRESSION: 1. No CT evidence for acute intracranial abnormality. Atrophy and chronic small vessel ischemic changes of the white matter 2. Multilevel degenerative changes of the cervical spine. No acute osseous abnormality. Minimal superior endplate deformity at T0-G2 of uncertain chronicity, but probably chronic. Electronically Signed   By: Donavan Foil M.D.   On: 01/14/2022 00:52   CT CERVICAL SPINE WO CONTRAST  Result Date: 01/14/2022 CLINICAL DATA:  Mechanical fall EXAM: CT HEAD WITHOUT CONTRAST CT CERVICAL SPINE WITHOUT CONTRAST TECHNIQUE: Multidetector CT imaging of the head and cervical spine was performed following the standard protocol without intravenous contrast. Multiplanar CT image reconstructions of the cervical spine were also generated. RADIATION DOSE REDUCTION: This exam was performed according to the departmental dose-optimization program which includes automated exposure control, adjustment of the mA and/or kV according to patient size and/or use of iterative reconstruction technique. COMPARISON:  CT brain 12/18/2021 FINDINGS: CT HEAD FINDINGS Brain: No acute territorial infarction, hemorrhage or intracranial mass. Advanced atrophy. Mild chronic small vessel ischemic changes  of the white matter. Stable ventricle size Vascular: No hyperdense vessels. Vertebral and carotid vascular calcification Skull: No fracture Sinuses/Orbits: No acute finding. Other: None CT CERVICAL SPINE FINDINGS Alignment: Mild reversal of cervical lordosis. Trace anterolisthesis C3 on C4 and C4 on C5. Facet alignment is maintained. Skull base and vertebrae: Minimal superior endplate deformity at T1 and T2  of uncertain chronicity. Cervical vertebral bodies show no fracture Soft tissues and spinal canal: No prevertebral fluid or swelling. No visible canal hematoma. Disc levels: Advanced multilevel degenerative change C4 through C7. Facet degenerative changes at multiple levels with foraminal narrowing Upper chest: Negative. Other: None IMPRESSION: 1. No CT evidence for acute intracranial abnormality. Atrophy and chronic small vessel ischemic changes of the white matter 2. Multilevel degenerative changes of the cervical spine. No acute osseous abnormality. Minimal superior endplate deformity at Z6-X0 of uncertain chronicity, but probably chronic. Electronically Signed   By: Donavan Foil M.D.   On: 01/14/2022 00:52   DG Pelvis Portable  Result Date: 01/14/2022 CLINICAL DATA:  Postop. EXAM: PORTABLE PELVIS 1-2 VIEWS COMPARISON:  Preoperative radiograph yesterday. FINDINGS: Left hip arthroplasty in expected alignment. No periprosthetic lucency or fracture. Recent postsurgical change includes air and edema in the soft tissues. IMPRESSION: Left hip arthroplasty without immediate postoperative complication. Electronically Signed   By: Keith Rake M.D.   On: 01/14/2022 19:49   DG HIP UNILAT WITH PELVIS 2-3 VIEWS LEFT  Result Date: 01/13/2022 CLINICAL DATA:  Fall, left hip pain. EXAM: DG HIP (WITH OR WITHOUT PELVIS) 2-3V LEFT COMPARISON:  10/11/2021. FINDINGS: There is a slightly comminuted subcapital femoral fracture on the left with superior subluxation of the distal fracture fragment. The remaining bony  structures are intact. Mild degenerative changes are noted at the hips bilaterally and in the lower lumbar spine. Surgical clips are present in the pelvis. Vascular calcifications are noted in the lower extremities bilaterally. IMPRESSION: Slightly comminuted subcapital femoral fracture on the left with superior subluxation of the distal fracture fragment. Electronically Signed   By: Brett Fairy M.D.   On: 01/13/2022 23:40       Hosie Poisson M.D. [facility] 01/15/2022, 10:48 AM  Available via Epic secure chat 7am-7pm After 7 pm, please refer to night coverage provider listed on amion.

## 2022-01-16 DIAGNOSIS — M6259 Muscle wasting and atrophy, not elsewhere classified, multiple sites: Secondary | ICD-10-CM | POA: Diagnosis not present

## 2022-01-16 DIAGNOSIS — Z741 Need for assistance with personal care: Secondary | ICD-10-CM | POA: Diagnosis not present

## 2022-01-16 DIAGNOSIS — F32A Depression, unspecified: Secondary | ICD-10-CM | POA: Diagnosis not present

## 2022-01-16 DIAGNOSIS — Z96649 Presence of unspecified artificial hip joint: Secondary | ICD-10-CM

## 2022-01-16 DIAGNOSIS — R2681 Unsteadiness on feet: Secondary | ICD-10-CM | POA: Diagnosis not present

## 2022-01-16 DIAGNOSIS — R4182 Altered mental status, unspecified: Secondary | ICD-10-CM | POA: Diagnosis not present

## 2022-01-16 DIAGNOSIS — D649 Anemia, unspecified: Secondary | ICD-10-CM | POA: Diagnosis not present

## 2022-01-16 DIAGNOSIS — Z9181 History of falling: Secondary | ICD-10-CM | POA: Diagnosis not present

## 2022-01-16 DIAGNOSIS — F419 Anxiety disorder, unspecified: Secondary | ICD-10-CM | POA: Diagnosis not present

## 2022-01-16 DIAGNOSIS — G309 Alzheimer's disease, unspecified: Secondary | ICD-10-CM | POA: Diagnosis not present

## 2022-01-16 DIAGNOSIS — S72002D Fracture of unspecified part of neck of left femur, subsequent encounter for closed fracture with routine healing: Secondary | ICD-10-CM | POA: Diagnosis not present

## 2022-01-16 DIAGNOSIS — N179 Acute kidney failure, unspecified: Secondary | ICD-10-CM | POA: Diagnosis not present

## 2022-01-16 DIAGNOSIS — S72002S Fracture of unspecified part of neck of left femur, sequela: Secondary | ICD-10-CM | POA: Diagnosis not present

## 2022-01-16 DIAGNOSIS — I10 Essential (primary) hypertension: Secondary | ICD-10-CM | POA: Diagnosis not present

## 2022-01-16 DIAGNOSIS — S72002A Fracture of unspecified part of neck of left femur, initial encounter for closed fracture: Secondary | ICD-10-CM | POA: Diagnosis not present

## 2022-01-16 DIAGNOSIS — R41841 Cognitive communication deficit: Secondary | ICD-10-CM | POA: Diagnosis not present

## 2022-01-16 DIAGNOSIS — G301 Alzheimer's disease with late onset: Secondary | ICD-10-CM | POA: Diagnosis not present

## 2022-01-16 DIAGNOSIS — F0280 Dementia in other diseases classified elsewhere without behavioral disturbance: Secondary | ICD-10-CM | POA: Diagnosis not present

## 2022-01-16 DIAGNOSIS — E039 Hypothyroidism, unspecified: Secondary | ICD-10-CM | POA: Diagnosis not present

## 2022-01-16 DIAGNOSIS — E440 Moderate protein-calorie malnutrition: Secondary | ICD-10-CM | POA: Diagnosis not present

## 2022-01-16 DIAGNOSIS — Z7401 Bed confinement status: Secondary | ICD-10-CM | POA: Diagnosis not present

## 2022-01-16 DIAGNOSIS — M6281 Muscle weakness (generalized): Secondary | ICD-10-CM | POA: Diagnosis not present

## 2022-01-16 DIAGNOSIS — Z471 Aftercare following joint replacement surgery: Secondary | ICD-10-CM | POA: Diagnosis not present

## 2022-01-16 LAB — BASIC METABOLIC PANEL
Anion gap: 5 (ref 5–15)
BUN: 20 mg/dL (ref 8–23)
CO2: 25 mmol/L (ref 22–32)
Calcium: 8.6 mg/dL — ABNORMAL LOW (ref 8.9–10.3)
Chloride: 109 mmol/L (ref 98–111)
Creatinine, Ser: 1.15 mg/dL — ABNORMAL HIGH (ref 0.44–1.00)
GFR, Estimated: 45 mL/min — ABNORMAL LOW (ref >60)
Glucose, Bld: 115 mg/dL — ABNORMAL HIGH (ref 70–99)
Potassium: 4.7 mmol/L (ref 3.5–5.1)
Sodium: 139 mmol/L (ref 135–145)

## 2022-01-16 LAB — CBC WITH DIFFERENTIAL/PLATELET
Abs Immature Granulocytes: 0.04 10*3/uL (ref 0.00–0.07)
Basophils Absolute: 0 10*3/uL (ref 0.0–0.1)
Basophils Relative: 0 %
Eosinophils Absolute: 0.1 10*3/uL (ref 0.0–0.5)
Eosinophils Relative: 1 %
HCT: 28.9 % — ABNORMAL LOW (ref 36.0–46.0)
Hemoglobin: 9.3 g/dL — ABNORMAL LOW (ref 12.0–15.0)
Immature Granulocytes: 0 %
Lymphocytes Relative: 14 %
Lymphs Abs: 1.4 10*3/uL (ref 0.7–4.0)
MCH: 32.9 pg (ref 26.0–34.0)
MCHC: 32.2 g/dL (ref 30.0–36.0)
MCV: 102.1 fL — ABNORMAL HIGH (ref 80.0–100.0)
Monocytes Absolute: 0.9 10*3/uL (ref 0.1–1.0)
Monocytes Relative: 8 %
Neutro Abs: 8.1 10*3/uL — ABNORMAL HIGH (ref 1.7–7.7)
Neutrophils Relative %: 77 %
Platelets: 143 10*3/uL — ABNORMAL LOW (ref 150–400)
RBC: 2.83 MIL/uL — ABNORMAL LOW (ref 3.87–5.11)
RDW: 12.4 % (ref 11.5–15.5)
WBC: 10.5 10*3/uL (ref 4.0–10.5)
nRBC: 0 % (ref 0.0–0.2)

## 2022-01-16 MED ORDER — DOCUSATE SODIUM 100 MG PO CAPS: 100.0000 mg | ORAL_CAPSULE | Freq: Two times a day (BID) | ORAL | 0 refills | Status: DC

## 2022-01-16 NOTE — TOC Progression Note (Addendum)
Transition of Care [facility]) - Progression Note    Patient Details  Name: Michelle Jones MRN: [id number] Date of Birth: 1930/04/18  Transition [facility] CM/SW Contact  Joanne Chars, LCSW Phone Number: 01/16/2022, 9:44 AM  Clinical Narrative:   Bed offers provided to pt and to son, by phone.   0955: Call back from son, would like to choose [facility].  CSW confirmed with Kitty/[facility] that she can accept pt today. MD informed.    Expected Discharge Plan: [place] Barriers to Discharge: Continued Medical Work up, SNF Pending bed offer  Expected Discharge Plan and Services Expected Discharge Plan: [place] In-house Referral: Clinical Social Work   Post Acute Care Choice: [place] Living arrangements for the past 2 months: [place] ([place])                                       Social Determinants of Health (SDOH) Interventions    Readmission Risk Interventions     View : No data to display.

## 2022-01-16 NOTE — Plan of Care (Signed)
  Problem: Education: Goal: Knowledge of General Education information will improve Description: Including pain rating scale, medication(s)/side effects and non-pharmacologic comfort measures 01/16/2022 1413 by Roetta Sessions D, LPN Outcome: Adequate for Discharge 01/16/2022 1413 by Roetta Sessions D, LPN Outcome: Adequate for Discharge   Problem: Health Behavior/Discharge Planning: Goal: Ability to manage health-related needs will improve 01/16/2022 1413 by Roetta Sessions D, LPN Outcome: Adequate for Discharge 01/16/2022 1413 by Roetta Sessions D, LPN Outcome: Adequate for Discharge   Problem: Clinical Measurements: Goal: Ability to maintain clinical measurements within normal limits will improve 01/16/2022 1413 by Roetta Sessions D, LPN Outcome: Adequate for Discharge 01/16/2022 1413 by Roetta Sessions D, LPN Outcome: Adequate for Discharge Goal: Will remain free from infection 01/16/2022 1413 by Roetta Sessions D, LPN Outcome: Adequate for Discharge 01/16/2022 1413 by Roetta Sessions D, LPN Outcome: Adequate for Discharge Goal: Diagnostic test results will improve 01/16/2022 1413 by Roetta Sessions D, LPN Outcome: Adequate for Discharge 01/16/2022 1413 by Roetta Sessions D, LPN Outcome: Adequate for Discharge Goal: Respiratory complications will improve 01/16/2022 1413 by Roetta Sessions D, LPN Outcome: Adequate for Discharge 01/16/2022 1413 by Roetta Sessions D, LPN Outcome: Adequate for Discharge Goal: Cardiovascular complication will be avoided 01/16/2022 1413 by Roetta Sessions D, LPN Outcome: Adequate for Discharge 01/16/2022 1413 by Roetta Sessions D, LPN Outcome: Adequate for Discharge   Problem: Activity: Goal: Risk for activity intolerance will decrease 01/16/2022 1413 by Roetta Sessions D, LPN Outcome: Adequate for Discharge 01/16/2022 1413 by Roetta Sessions D, LPN Outcome: Adequate for Discharge   Problem: Nutrition: Goal: Adequate nutrition will be maintained 01/16/2022 1413 by Roetta Sessions D, LPN Outcome:  Adequate for Discharge 01/16/2022 1413 by Roetta Sessions D, LPN Outcome: Adequate for Discharge   Problem: Coping: Goal: Level of anxiety will decrease 01/16/2022 1413 by Roetta Sessions D, LPN Outcome: Adequate for Discharge 01/16/2022 1413 by Roetta Sessions D, LPN Outcome: Adequate for Discharge   Problem: Elimination: Goal: Will not experience complications related to bowel motility 01/16/2022 1413 by Roetta Sessions D, LPN Outcome: Adequate for Discharge 01/16/2022 1413 by Roetta Sessions D, LPN Outcome: Adequate for Discharge Goal: Will not experience complications related to urinary retention 01/16/2022 1413 by Roetta Sessions D, LPN Outcome: Adequate for Discharge 01/16/2022 1413 by Roetta Sessions D, LPN Outcome: Adequate for Discharge   Problem: Pain Managment: Goal: General experience of comfort will improve 01/16/2022 1413 by Roetta Sessions D, LPN Outcome: Adequate for Discharge 01/16/2022 1413 by Roetta Sessions D, LPN Outcome: Adequate for Discharge   Problem: Safety: Goal: Ability to remain free from injury will improve 01/16/2022 1413 by Roetta Sessions D, LPN Outcome: Adequate for Discharge 01/16/2022 1413 by Roetta Sessions D, LPN Outcome: Adequate for Discharge   Problem: Skin Integrity: Goal: Risk for impaired skin integrity will decrease 01/16/2022 1413 by Roetta Sessions D, LPN Outcome: Adequate for Discharge 01/16/2022 1413 by Roetta Sessions D, LPN Outcome: Adequate for Discharge

## 2022-01-16 NOTE — Progress Notes (Signed)
Attempted to call report to [facility]. Unable to reach.

## 2022-01-16 NOTE — Evaluation (Signed)
Occupational Therapy Evaluation Patient Details Name: Michelle Jones MRN: [id number] DOB: 1930-06-17 Today's Date: 01/16/2022   History of Present Illness Michelle Jones is a 88 y.o. female with medical history significant of dementia, HTN, HLD, hypothyroidism, anxiety and depression who presents with a fall. S/P L femur fx, s/p L hemiarthroplasty. WBAT, posterior hip precautions.   Clinical Impression   PTA, pt recently transitioned to ALF. Per chart, pt ambulatory with Rollator though unsure of ADL functional abilities due to pt confusion today. Pt with difficulty following commands, motor planning to initiate tasks and questionable coordination vs visual perception difficulties. Pt requiring increased assist today in comparison to PT eval yesterday with overall Total A needed for bed mobility and unable to successfully stand with +1 assist this AM. Pt requires up to Max A for UB ADL and Total A for LB ADLs bed level. Alerted staff to the functional status decline observed today. Recommend SNF rehab at DC as ALF likely unable to provide the heavier physical assist pt currently requires.       Recommendations for follow up therapy are one component of a multi-disciplinary discharge planning process, led by the attending physician.  Recommendations may be updated based on patient status, additional functional criteria and insurance authorization.   Follow Up Recommendations  Skilled nursing-short term rehab (<3 hours/day)    Assistance Recommended at Discharge Frequent or constant Supervision/Assistance  Patient can return home with the following Two people to help with walking and/or transfers;Two people to help with bathing/dressing/bathroom    Functional Status Assessment  Patient has had a recent decline in their functional status and demonstrates the ability to make significant improvements in function in a reasonable and predictable amount of time.  Equipment Recommendations  Other  (comment) (Rolling walker)    Recommendations for Other Services       Precautions / Restrictions Precautions Precautions: Fall Restrictions Weight Bearing Restrictions: Yes LLE Weight Bearing: Weight bearing as tolerated      Mobility Bed Mobility Overal bed mobility: Needs Assistance Bed Mobility: Supine to Sit, Sit to Supine     Supine to sit: Total assist, HOB elevated Sit to supine: Total assist   General bed mobility comments: difficulty following directions despite multimodal cues, difficulty initiating tasks requiring Total A in all areas of bed mobility. When returning back to bed, pt attempting to lay horizontally in bed requiring Total A and max cues to guide trunk and heavy assist to lift LEs back into bed    Transfers Overall transfer level: Needs assistance Equipment used: Rolling walker (2 wheels)               General transfer comment: attempted to stand with RW at bedside, poor following of instructions for hand placement, unable to successfully stand with +1 assist today      Balance Overall balance assessment: Needs assistance, History of Falls Sitting-balance support: Feet supported, Bilateral upper extremity supported Sitting balance-Leahy Scale: Poor Sitting balance - Comments: reliant on UE support EOB Postural control: Posterior lean                                 ADL either performed or assessed with clinical judgement   ADL Overall ADL's : Needs assistance/impaired Eating/Feeding: Supervision/ safety;Sitting   Grooming: Minimal assistance;Sitting   Upper Body Bathing: Maximal assistance;Sitting   Lower Body Bathing: Total assistance;Bed level   Upper Body Dressing : Maximal assistance;Sitting  Lower Body Dressing: Total assistance       Toileting- Clothing Manipulation and Hygiene: Total assistance;Bed level         General ADL Comments: Appearing more confused than per PT's eval with speech difficulties,  impaired following commands and requiring increased physical assist     Vision Baseline Vision/History: 1 Wears glasses Ability to See in Adequate Light: 2 Moderately impaired Patient Visual Report: Other (comment) (pt unable to accurately answer; endorses some blurriness "sometimes") Vision Assessment?: Vision impaired- to be further tested in functional context Additional Comments: overshooting when reaching for bedrail, referencing "rollator over there" though no walker in the direction pt was pointing     Perception     Praxis      Pertinent Vitals/Pain Pain Assessment Pain Assessment: Faces Faces Pain Scale: Hurts even more Pain Location: L LE Pain Descriptors / Indicators: Discomfort, Grimacing, Guarding, Moaning Pain Intervention(s): Monitored during session, Limited activity within patient's tolerance, Repositioned     Hand Dominance Right   Extremity/Trunk Assessment Upper Extremity Assessment Upper Extremity Assessment: Generalized weakness   Lower Extremity Assessment Lower Extremity Assessment: Defer to PT evaluation   Cervical / Trunk Assessment Cervical / Trunk Assessment: Kyphotic   Communication Communication Communication: Other (comment);Expressive difficulties (slurred speech, dsyarthric, unsure if baseline - RN aware)   Cognition Arousal/Alertness: Awake/alert Behavior During Therapy: WFL for tasks assessed/performed Overall Cognitive Status: No family/caregiver present to determine baseline cognitive functioning                                 General Comments: pt with slurred speech, dysarthric and difficult to understand at times. difficulty following directions, appeared more confused than per PT eval yesterday. requires repetition and multimodal cues     General Comments  HR WFL, noted increased cognitive/physical difficulties compared to documentation yesterday - RN aware    Exercises     Shoulder Instructions      Home  Living Family/patient expects to be discharged to:: Assisted living                                        Prior Functioning/Environment Prior Level of Function : Patient poor historian/Family not available;History of Falls (last six months);Needs assist             Mobility Comments: patient just moved to assisted living, uses rollator at baseline ADLs Comments: poor historian, reports toileting self, assist with showers at ALF        OT Problem List: Decreased strength;Decreased activity tolerance;Impaired balance (sitting and/or standing);Decreased cognition;Decreased safety awareness;Decreased knowledge of use of DME or AE;Decreased knowledge of precautions;Pain      OT Treatment/Interventions: Self-care/ADL training;Therapeutic exercise;Energy conservation;DME and/or AE instruction;Therapeutic activities;Patient/family education    OT Goals(Current goals can be found in the care plan section) Acute Rehab OT Goals Patient Stated Goal: agreeable to sit EOB OT Goal Formulation: Patient unable to participate in goal setting Time For Goal Achievement: 01/30/22 Potential to Achieve Goals: Fair  OT Frequency: Min 2X/week    Co-evaluation              AM-PAC OT "6 Clicks" Daily Activity     Outcome Measure Help from another person eating meals?: A Little Help from another person taking care of personal grooming?: A Little Help from another person toileting, which includes using toliet,  bedpan, or urinal?: Total Help from another person bathing (including washing, rinsing, drying)?: A Lot Help from another person to put on and taking off regular upper body clothing?: A Lot Help from another person to put on and taking off regular lower body clothing?: Total 6 Click Score: 12   End of Session Equipment Utilized During Treatment: Rolling walker (2 wheels) Nurse Communication: Mobility status;Other (comment) (speech appearance, cognitive/physical  appearance)  Activity Tolerance: Other (comment) (limited by cognition) Patient left: in bed;with call bell/phone within reach;with bed alarm set  OT Visit Diagnosis: Unsteadiness on feet (R26.81);Other abnormalities of gait and mobility (R26.89);Muscle weakness (generalized) (M62.81);Other symptoms and signs involving cognitive function                Time: 1050-1113 OT Time Calculation (min): 23 min Charges:  OT General Charges $OT Visit: 1 Visit OT Evaluation $OT Eval Moderate Complexity: 1 Mod  Malachy Chamber, OTR/L Acute Rehab Services Office: [phone]   Layla Maw 01/16/2022, 11:40 AM

## 2022-01-16 NOTE — Discharge Summary (Signed)
Physician Discharge Summary  Michelle Jones ESP:[id number] DOB: 1929-11-28 DOA: 01/13/2022  PCP: Maury Dus, MD  Admit date: 01/13/2022 Discharge date: 01/16/2022  Admitted From: Home Disposition:  [facility]  Recommendations for Outpatient Follow-up:  Follow up with PCP in 1-2 weeks Follow-up with Orthopedics, Dr. Alvan Dame  Recommend BMP/CBC 1 week  Discharge Condition: Stable CODE STATUS: Full code Diet recommendation: Heart healthy diet  History of present illness:  Michelle Jones is a 88 year old female with past medical history significant for dementia, HTN, HLD, hypothyroidism, anxiety/depression who presented to [facility] ED on 6/4 with complaints of left hip pain.  Patient reports mechanical fall at home without loss of consciousness.  Utilizes a walker at baseline.  Evaluation in the ED notable for a left comminuted subcapital femoral fracture.  CT head/C-spine without acute findings.  Orthopedics was consulted.  Hospitalist service consulted for further evaluation management of left hip fracture.  Hospital course:  Left hip fracture, closed Patient presenting to the ED following mechanical fall with left hip pain.  Imaging notable for left comminuted subcapital femoral fracture.  Orthopedics was consulted and patient underwent left hip hemiarthroplasty by Dr. Alvan Dame on 01/14/2022.  Continue aspirin 81 mg p.o. twice daily for DVT prophylaxis per orthopedics.  Discharging to SNF for further rehabilitation.  Dementia due to Alzheimer's disease Supportive care.  Acute renal failure Creatinine 1.32 on admission, likely due to poor oral intake and prolonged n.p.o. status pending surgical invention as above.  Patient's furosemide was initially held and supported with IV fluid hydration with improvement of creatinine to 1.15 at time of discharge.  May resume furosemide on discharge and recommend repeat BMP 1 week.  Encourage increased oral intake.   Hyperkalemia Etiology likely hemolyzed  blood sample.  Improved with IV fluid hydration.  Potassium 4.7 at time of discharge.  Hypothyroidism Levothyroxine 100 mcg p.o. daily  Essential hypertension Continue metoprolol succinate 100 mg p.o. daily, furosemide 20 mg p.o. daily  Anxiety/depression Continue Wellbutrin 3 mg every morning  Acute blood loss anemia, postoperatively Hemoglobin 12 on admission, trended down to 9.3 at time of discharge.  Continue ferrous sulfate.  Recommend repeat CBC 1 week  Malnutrition Nutrition Status: Nutrition Problem: Increased nutrient needs Etiology: hip fracture, post-op healing Signs/Symptoms: estimated needs Interventions: Ensure Enlive (each supplement provides 350kcal and 20 grams of protein), MVI    Discharge Diagnoses:  Principal Problem:   Closed left hip fracture ([place]) Active Problems:   Essential hypertension   Hypothyroidism   AKI (acute kidney injury) ([place])   Dementia due to Alzheimer's disease ([place])   S/P left hip hemiarthroplasty    Discharge Instructions  Discharge Instructions     Call MD for:  difficulty breathing, headache or visual disturbances   Complete by: As directed    Call MD for:  extreme fatigue   Complete by: As directed    Call MD for:  persistant dizziness or light-headedness   Complete by: As directed    Call MD for:  persistant nausea and vomiting   Complete by: As directed    Call MD for:  severe uncontrolled pain   Complete by: As directed    Call MD for:  temperature >100.4   Complete by: As directed    Diet - low sodium heart healthy   Complete by: As directed    Increase activity slowly   Complete by: As directed    No wound care   Complete by: As directed       Allergies  as of 01/16/2022       Reactions   Nsaids Other (See Comments)   Unknown reaction - listed on [facility] 01/13/22   Penicillins Other (See Comments)   Has patient had a PCN reaction causing immediate rash, facial/tongue/throat swelling, SOB or lightheadedness with  hypotension: no throat swelling or SOB Has patient had a PCN reaction causing severe rash involving mucus membranes or skin necrosis: unknown, patients states she had sores on her mouth Has patient had a PCN reaction that required hospitalization: no Has patient had a PCN reaction occurring within the last 10 years: no If all of the above answers are "NO", then may proceed with Cephalosp   Septra [sulfamethoxazole-trimethoprim] Other (See Comments)   Unknown reaction - listed on [facility] 01/13/2022        Medication List     TAKE these medications    acetaminophen 650 MG CR tablet Commonly known as: TYLENOL Take 650 mg by mouth 2 (two) times daily.   aspirin 81 MG chewable tablet Chew 1 tablet (81 mg total) by mouth 2 (two) times daily for 28 days.   buPROPion 300 MG 24 hr tablet Commonly known as: WELLBUTRIN XL 300 mg every morning. Take one tablet by mouth once daily for depression   CoQ10 200 MG Caps Take 200 mg by mouth every morning.   diclofenac Sodium 1 % Gel Commonly known as: VOLTAREN Apply 4 g topically 4 (four) times daily. What changed:  how much to take when to take this reasons to take this   docusate sodium 100 MG capsule Commonly known as: COLACE Take 1 capsule (100 mg total) by mouth 2 (two) times daily.   DULoxetine 60 MG capsule Commonly known as: CYMBALTA 60 mg every morning.   famotidine 40 MG tablet Commonly known as: PEPCID Take 40 mg by mouth every morning.   ferrous sulfate 325 (65 FE) MG tablet Take 325 mg by mouth every morning.   furosemide 20 MG tablet Commonly known as: LASIX Take 20 mg by mouth every morning.   guaiFENesin 600 MG 12 hr tablet Commonly known as: MUCINEX Take 600 mg by mouth 2 (two) times daily.   HYDROcodone-acetaminophen 5-325 MG tablet Commonly known as: NORCO/VICODIN Take 1 tablet by mouth every 6 (six) hours as needed.   levothyroxine 100 MCG tablet Commonly known as: SYNTHROID Take 100 mcg by mouth every  morning.   Loperamide HCl 1 MG/7.5ML Liqd Take 2 mg by mouth every 6 (six) hours as needed (diarrhea).   memantine 5 MG tablet Commonly known as: NAMENDA . Take 1 tablet (5 mg at night) for 2 weeks, then increase to 1 tablet (5 mg) twice a day What changed:  how much to take how to take this when to take this additional instructions   methocarbamol 500 MG tablet Commonly known as: ROBAXIN Take 1 tablet (500 mg total) by mouth every 8 (eight) hours as needed for muscle spasms.   metoprolol succinate 100 MG 24 hr tablet Commonly known as: TOPROL-XL Take 100 mg by mouth every morning.   multivitamin with minerals Tabs tablet Take 1 tablet by mouth every morning. Centrum Silver Women   oxybutynin 5 MG tablet Commonly known as: DITROPAN Take 5 mg by mouth 2 (two) times daily.   raloxifene 60 MG tablet Commonly known as: EVISTA Take 60 mg by mouth every morning.   rosuvastatin 10 MG tablet Commonly known as: CRESTOR Take 10 mg by mouth every Friday.   senna-docusate 8.6-50 MG tablet  Commonly known as: Senokot-S Take 1 tablet by mouth daily as needed (constipation).   SIMETHICONE PO Take 2 tablets by mouth every 12 (twelve) hours as needed (heartburn/indigestion). Chewable - unknown strength   SORE THROAT LOZENGES MT Use as directed 1 lozenge in the mouth or throat every 4 (four) hours as needed (sore throat).        Follow-up Information     Paralee Cancel, MD. Schedule an appointment as soon as possible for a visit in 2 day(s).   Specialty: Orthopedic Surgery Contact information: [address] [place] [address] [phone]         Maury Dus, MD. Schedule an appointment as soon as possible for a visit in 1 week(s).   Specialty: Family Medicine Contact information: [place] [address] [phone]                Allergies  Allergen Reactions   Nsaids Other (See Comments)    Unknown  reaction - listed on [facility] 01/13/22   Penicillins Other (See Comments)    Has patient had a PCN reaction causing immediate rash, facial/tongue/throat swelling, SOB or lightheadedness with hypotension: no throat swelling or SOB Has patient had a PCN reaction causing severe rash involving mucus membranes or skin necrosis: unknown, patients states she had sores on her mouth Has patient had a PCN reaction that required hospitalization: no Has patient had a PCN reaction occurring within the last 10 years: no If all of the above answers are "NO", then may proceed with Cephalosp   Septra [Sulfamethoxazole-Trimethoprim] Other (See Comments)    Unknown reaction - listed on [facility] 01/13/2022    Consultations: Orthopedics, Dr. Alvan Dame   Procedures/Studies: DG Chest 1 View  Result Date: 01/13/2022 CLINICAL DATA:  Fall. EXAM: CHEST  1 VIEW COMPARISON:  12/01/2015. FINDINGS: The heart size and mediastinal contours are within normal limits. There is atherosclerotic calcification of the aorta. No consolidation, effusion, or pneumothorax. No acute osseous abnormality. Surgical clips are present in the right upper quadrant. IMPRESSION: No active disease. Electronically Signed   By: Brett Fairy M.D.   On: 01/13/2022 23:40   CT HEAD WO CONTRAST  Result Date: 01/14/2022 CLINICAL DATA:  Mechanical fall EXAM: CT HEAD WITHOUT CONTRAST CT CERVICAL SPINE WITHOUT CONTRAST TECHNIQUE: Multidetector CT imaging of the head and cervical spine was performed following the standard protocol without intravenous contrast. Multiplanar CT image reconstructions of the cervical spine were also generated. RADIATION DOSE REDUCTION: This exam was performed according to the departmental dose-optimization program which includes automated exposure control, adjustment of the mA and/or kV according to patient size and/or use of iterative reconstruction technique. COMPARISON:  CT brain 12/18/2021 FINDINGS: CT HEAD FINDINGS Brain: No acute territorial  infarction, hemorrhage or intracranial mass. Advanced atrophy. Mild chronic small vessel ischemic changes of the white matter. Stable ventricle size Vascular: No hyperdense vessels. Vertebral and carotid vascular calcification Skull: No fracture Sinuses/Orbits: No acute finding. Other: None CT CERVICAL SPINE FINDINGS Alignment: Mild reversal of cervical lordosis. Trace anterolisthesis C3 on C4 and C4 on C5. Facet alignment is maintained. Skull base and vertebrae: Minimal superior endplate deformity at T1 and T2 of uncertain chronicity. Cervical vertebral bodies show no fracture Soft tissues and spinal canal: No prevertebral fluid or swelling. No visible canal hematoma. Disc levels: Advanced multilevel degenerative change C4 through C7. Facet degenerative changes at multiple levels with foraminal narrowing Upper chest: Negative. Other: None IMPRESSION: 1. No CT evidence for acute intracranial  abnormality. Atrophy and chronic small vessel ischemic changes of the white matter 2. Multilevel degenerative changes of the cervical spine. No acute osseous abnormality. Minimal superior endplate deformity at X4-I0 of uncertain chronicity, but probably chronic. Electronically Signed   By: Donavan Foil M.D.   On: 01/14/2022 00:52   CT Head Wo Contrast  Result Date: 12/18/2021 CLINICAL DATA:  Provided history: Head trauma, minor. EXAM: CT HEAD WITHOUT CONTRAST TECHNIQUE: Contiguous axial images were obtained from the base of the skull through the vertex without intravenous contrast. RADIATION DOSE REDUCTION: This exam was performed according to the departmental dose-optimization program which includes automated exposure control, adjustment of the mA and/or kV according to patient size and/or use of iterative reconstruction technique. COMPARISON:  No pertinent prior exams available for comparison. FINDINGS: Brain: Moderate cerebral atrophy. Comparatively mild cerebellar atrophy. There is no acute intracranial hemorrhage. No  demarcated cortical infarct. No extra-axial fluid collection. No evidence of an intracranial mass. No midline shift. Vascular: No hyperdense vessel.  Atherosclerotic calcifications. Skull: Normal. Negative for fracture or focal lesion. Sinuses/Orbits: No mass or acute finding within the imaged orbits. No significant paranasal sinus disease at the imaged levels. Other: Small right mastoid effusion. IMPRESSION: No evidence of acute intracranial abnormality. Moderate cerebral atrophy. Comparatively mild cerebellar atrophy. Small right mastoid effusion. Electronically Signed   By: Kellie Simmering D.O.   On: 12/18/2021 09:21   CT CERVICAL SPINE WO CONTRAST  Result Date: 01/14/2022 CLINICAL DATA:  Mechanical fall EXAM: CT HEAD WITHOUT CONTRAST CT CERVICAL SPINE WITHOUT CONTRAST TECHNIQUE: Multidetector CT imaging of the head and cervical spine was performed following the standard protocol without intravenous contrast. Multiplanar CT image reconstructions of the cervical spine were also generated. RADIATION DOSE REDUCTION: This exam was performed according to the departmental dose-optimization program which includes automated exposure control, adjustment of the mA and/or kV according to patient size and/or use of iterative reconstruction technique. COMPARISON:  CT brain 12/18/2021 FINDINGS: CT HEAD FINDINGS Brain: No acute territorial infarction, hemorrhage or intracranial mass. Advanced atrophy. Mild chronic small vessel ischemic changes of the white matter. Stable ventricle size Vascular: No hyperdense vessels. Vertebral and carotid vascular calcification Skull: No fracture Sinuses/Orbits: No acute finding. Other: None CT CERVICAL SPINE FINDINGS Alignment: Mild reversal of cervical lordosis. Trace anterolisthesis C3 on C4 and C4 on C5. Facet alignment is maintained. Skull base and vertebrae: Minimal superior endplate deformity at T1 and T2 of uncertain chronicity. Cervical vertebral bodies show no fracture Soft tissues  and spinal canal: No prevertebral fluid or swelling. No visible canal hematoma. Disc levels: Advanced multilevel degenerative change C4 through C7. Facet degenerative changes at multiple levels with foraminal narrowing Upper chest: Negative. Other: None IMPRESSION: 1. No CT evidence for acute intracranial abnormality. Atrophy and chronic small vessel ischemic changes of the white matter 2. Multilevel degenerative changes of the cervical spine. No acute osseous abnormality. Minimal superior endplate deformity at X6-P5 of uncertain chronicity, but probably chronic. Electronically Signed   By: Donavan Foil M.D.   On: 01/14/2022 00:52   DG Pelvis Portable  Result Date: 01/14/2022 CLINICAL DATA:  Postop. EXAM: PORTABLE PELVIS 1-2 VIEWS COMPARISON:  Preoperative radiograph yesterday. FINDINGS: Left hip arthroplasty in expected alignment. No periprosthetic lucency or fracture. Recent postsurgical change includes air and edema in the soft tissues. IMPRESSION: Left hip arthroplasty without immediate postoperative complication. Electronically Signed   By: Keith Rake M.D.   On: 01/14/2022 19:49   DG HIP UNILAT WITH PELVIS 2-3 VIEWS LEFT  Result Date:  01/13/2022 CLINICAL DATA:  Fall, left hip pain. EXAM: DG HIP (WITH OR WITHOUT PELVIS) 2-3V LEFT COMPARISON:  10/11/2021. FINDINGS: There is a slightly comminuted subcapital femoral fracture on the left with superior subluxation of the distal fracture fragment. The remaining bony structures are intact. Mild degenerative changes are noted at the hips bilaterally and in the lower lumbar spine. Surgical clips are present in the pelvis. Vascular calcifications are noted in the lower extremities bilaterally. IMPRESSION: Slightly comminuted subcapital femoral fracture on the left with superior subluxation of the distal fracture fragment. Electronically Signed   By: Brett Fairy M.D.   On: 01/13/2022 23:40     Subjective: Patient seen examined bedside, resting currently.   Pleasantly confused.  No family present.  Discharging to SNF.  No specific complaints this morning.  Denies headache, no chest pain, no shortness of breath, no abdominal pain.  No acute concerns overnight per nursing staff.  Discharge Exam: Vitals:   01/16/22 0500 01/16/22 0742  BP: 138/71 (!) 153/50  Pulse: 96 100  Resp: 14 (!) 21  Temp: 98.5 F (36.9 C) 98 F (36.7 C)  SpO2: 90% 99%   Vitals:   01/15/22 1555 01/15/22 2002 01/16/22 0500 01/16/22 0742  BP: (!) 98/46 (!) 133/52 138/71 (!) 153/50  Pulse: 82 68 96 100  Resp:   14 (!) 21  Temp: 99.4 F (37.4 C) 99.5 F (37.5 C) 98.5 F (36.9 C) 98 F (36.7 C)  TempSrc: Oral Oral Oral Oral  SpO2: 96% 92% 90% 99%    Physical Exam: GEN: NAD, alert, pleasantly confused HEENT: NCAT, PERRL, EOMI, sclera clear, MMM PULM: CTAB w/o wheezes/crackles, normal respiratory effort, on room air CV: RRR w/o M/G/R GI: abd soft, NTND, NABS, no R/G/M MSK: no peripheral edema, moves all extremities independently, noted surgical dressing in place, clean/dry/intact NEURO: CN II-XII intact, no focal deficits, sensation to light touch intact PSYCH: normal mood/affect Integumentary: Surgical dressing noted to left hip, clean/dry/intact, no other concerning lesions/rashes/wounds    The results of significant diagnostics from this hospitalization (including imaging, microbiology, ancillary and laboratory) are listed below for reference.     Microbiology: Recent Results (from the past 240 hour(s))  Surgical pcr screen     Status: Abnormal   Collection Time: 01/14/22 12:13 PM   Specimen: Nasal Mucosa; Nasal Swab  Result Value Ref Range Status   MRSA, PCR NEGATIVE NEGATIVE Final   Staphylococcus aureus POSITIVE (A) NEGATIVE Final    Comment: (NOTE) The Xpert SA Assay (FDA approved for NASAL specimens in patients 3 years of age and older), is one component of a comprehensive surveillance program. It is not intended to diagnose infection nor  to guide or monitor treatment. Performed at [facility], [place] [address], [place], [place] [address]      Labs: BNP (last 3 results) No results for input(s): BNP in the last 8760 hours. Basic Metabolic Panel: Recent Labs  Lab 01/13/22 2340 01/15/22 0219 01/15/22 1250 01/16/22 0314  NA 139 139 138 139  K 3.8 5.3* 4.1 4.7  CL 106 107 103 109  CO2 '23 27 24 25  '$ GLUCOSE 103* 135* 110* 115*  BUN '22 15 19 20  '$ CREATININE 1.32* 1.19* 1.23* 1.15*  CALCIUM 9.0 8.8* 8.7* 8.6*   Liver Function Tests: No results for input(s): AST, ALT, ALKPHOS, BILITOT, PROT, ALBUMIN in the last 168 hours. No results for input(s): LIPASE, AMYLASE in the last 168 hours. No results for input(s): AMMONIA in the last 168 hours.  CBC: Recent Labs  Lab 01/13/22 2340 01/15/22 0219 01/16/22 0314  WBC 8.3 8.9 10.5  NEUTROABS 6.0  --  8.1*  HGB 12.5 10.6* 9.3*  HCT 38.9 33.4* 28.9*  MCV 103.7* 102.5* 102.1*  PLT 164 167 143*   Cardiac Enzymes: No results for input(s): CKTOTAL, CKMB, CKMBINDEX, TROPONINI in the last 168 hours. BNP: Invalid input(s): POCBNP CBG: No results for input(s): GLUCAP in the last 168 hours. D-Dimer No results for input(s): DDIMER in the last 72 hours. Hgb A1c No results for input(s): HGBA1C in the last 72 hours. Lipid Profile No results for input(s): CHOL, HDL, LDLCALC, TRIG, CHOLHDL, LDLDIRECT in the last 72 hours. Thyroid function studies No results for input(s): TSH, T4TOTAL, T3FREE, THYROIDAB in the last 72 hours.  Invalid input(s): FREET3 Anemia work up No results for input(s): VITAMINB12, FOLATE, FERRITIN, TIBC, IRON, RETICCTPCT in the last 72 hours. Urinalysis    Component Value Date/Time   COLORURINE STRAW (A) 12/18/2021 1030   APPEARANCEUR CLEAR 12/18/2021 1030   LABSPEC 1.020 12/18/2021 1030   PHURINE 6.0 12/18/2021 1030   GLUCOSEU NEGATIVE 12/18/2021 1030   HGBUR NEGATIVE 12/18/2021 1030   BILIRUBINUR NEGATIVE 12/18/2021 1030   KETONESUR NEGATIVE  12/18/2021 1030   PROTEINUR NEGATIVE 12/18/2021 1030   UROBILINOGEN 0.2 05/05/2015 1043   NITRITE NEGATIVE 12/18/2021 1030   LEUKOCYTESUR TRACE (A) 12/18/2021 1030   Sepsis Labs Invalid input(s): PROCALCITONIN,  WBC,  LACTICIDVEN Microbiology Recent Results (from the past 240 hour(s))  Surgical pcr screen     Status: Abnormal   Collection Time: 01/14/22 12:13 PM   Specimen: Nasal Mucosa; Nasal Swab  Result Value Ref Range Status   MRSA, PCR NEGATIVE NEGATIVE Final   Staphylococcus aureus POSITIVE (A) NEGATIVE Final    Comment: (NOTE) The Xpert SA Assay (FDA approved for NASAL specimens in patients 57 years of age and older), is one component of a comprehensive surveillance program. It is not intended to diagnose infection nor to guide or monitor treatment. Performed at [facility], [place] [address] [place], [place] [address]      Time coordinating discharge: Over 30 minutes  SIGNED:   Alysa Duca J [place] DO  [facility] 01/16/2022, 10:43 AM

## 2022-01-16 NOTE — TOC Transition Note (Signed)
Transition of Care [facility]) - CM/SW Discharge Note   Patient Details  Name: Michelle Jones MRN: [id number] Date of Birth: 03/06/1930  [facility] CM/SW Contact:  Joanne Chars, LCSW Phone Number: 01/16/2022, 12:25 PM   Clinical Narrative:   Pt discharging to [facility].  RN call report to [phone].    Final next level of care: Skilled Nursing Facility Barriers to Discharge: Barriers Resolved   Patient Goals and CMS Choice Patient states their goals for this hospitalization and ongoing recovery are:: be able to walk CMS Medicare.gov Compare Post Acute Care list provided to:: Patient Represenative (must comment) Choice offered to / list presented to : Adult Children (son Alvester Chou)  Discharge Placement              Patient [facility] bed at: [facility] Patient to be transferred to facility by: [place] Name of family member notified: son Alvester Chou Patient and family notified of of transfer: 01/16/22  Discharge Plan and Services In-house Referral: Clinical Social Work   Post Acute Care Choice: [place]                               Social Determinants of Health (SDOH) Interventions     Readmission Risk Interventions     View : No data to display.

## 2022-01-16 NOTE — Progress Notes (Signed)
OT concerned with patient slurred speech while working with patient today. Patient assessed, smile symmetrical, motor skills and strength are good. Grips are strong. Speech is somewhat garbled but improved after patient brushed her teeth independently. Dr. [place] aware and at bedside assessing patient along with rapid response RN Kelli Churn. Dr. [place] assessed patient and states patient is clear to discharge, no further concerns.

## 2022-01-17 DIAGNOSIS — E440 Moderate protein-calorie malnutrition: Secondary | ICD-10-CM | POA: Diagnosis not present

## 2022-01-17 DIAGNOSIS — F419 Anxiety disorder, unspecified: Secondary | ICD-10-CM | POA: Diagnosis not present

## 2022-01-17 DIAGNOSIS — Z9181 History of falling: Secondary | ICD-10-CM | POA: Diagnosis not present

## 2022-01-17 DIAGNOSIS — F32A Depression, unspecified: Secondary | ICD-10-CM | POA: Diagnosis not present

## 2022-01-18 DIAGNOSIS — S72002A Fracture of unspecified part of neck of left femur, initial encounter for closed fracture: Secondary | ICD-10-CM | POA: Diagnosis not present

## 2022-01-18 DIAGNOSIS — G301 Alzheimer's disease with late onset: Secondary | ICD-10-CM | POA: Diagnosis not present

## 2022-01-23 DIAGNOSIS — F32A Depression, unspecified: Secondary | ICD-10-CM | POA: Diagnosis not present

## 2022-01-23 DIAGNOSIS — G309 Alzheimer's disease, unspecified: Secondary | ICD-10-CM | POA: Diagnosis not present

## 2022-01-23 DIAGNOSIS — D649 Anemia, unspecified: Secondary | ICD-10-CM | POA: Diagnosis not present

## 2022-01-23 DIAGNOSIS — S72002S Fracture of unspecified part of neck of left femur, sequela: Secondary | ICD-10-CM | POA: Diagnosis not present

## 2022-01-23 DIAGNOSIS — Z9181 History of falling: Secondary | ICD-10-CM | POA: Diagnosis not present

## 2022-01-23 DIAGNOSIS — M6281 Muscle weakness (generalized): Secondary | ICD-10-CM | POA: Diagnosis not present

## 2022-01-23 DIAGNOSIS — E440 Moderate protein-calorie malnutrition: Secondary | ICD-10-CM | POA: Diagnosis not present

## 2022-01-23 DIAGNOSIS — R2681 Unsteadiness on feet: Secondary | ICD-10-CM | POA: Diagnosis not present

## 2022-01-30 DIAGNOSIS — S72002S Fracture of unspecified part of neck of left femur, sequela: Secondary | ICD-10-CM | POA: Diagnosis not present

## 2022-01-30 DIAGNOSIS — D649 Anemia, unspecified: Secondary | ICD-10-CM | POA: Diagnosis not present

## 2022-01-30 DIAGNOSIS — M6281 Muscle weakness (generalized): Secondary | ICD-10-CM | POA: Diagnosis not present

## 2022-01-30 DIAGNOSIS — E440 Moderate protein-calorie malnutrition: Secondary | ICD-10-CM | POA: Diagnosis not present

## 2022-01-30 DIAGNOSIS — G309 Alzheimer's disease, unspecified: Secondary | ICD-10-CM | POA: Diagnosis not present

## 2022-01-30 DIAGNOSIS — Z9181 History of falling: Secondary | ICD-10-CM | POA: Diagnosis not present

## 2022-01-30 DIAGNOSIS — F32A Depression, unspecified: Secondary | ICD-10-CM | POA: Diagnosis not present

## 2022-01-30 DIAGNOSIS — R2681 Unsteadiness on feet: Secondary | ICD-10-CM | POA: Diagnosis not present

## 2022-02-04 ENCOUNTER — Other Ambulatory Visit: Payer: Self-pay | Admitting: *Deleted

## 2022-02-06 DIAGNOSIS — D649 Anemia, unspecified: Secondary | ICD-10-CM | POA: Diagnosis not present

## 2022-02-06 DIAGNOSIS — E440 Moderate protein-calorie malnutrition: Secondary | ICD-10-CM | POA: Diagnosis not present

## 2022-02-06 DIAGNOSIS — S72002S Fracture of unspecified part of neck of left femur, sequela: Secondary | ICD-10-CM | POA: Diagnosis not present

## 2022-02-06 DIAGNOSIS — Z9181 History of falling: Secondary | ICD-10-CM | POA: Diagnosis not present

## 2022-02-06 DIAGNOSIS — F32A Depression, unspecified: Secondary | ICD-10-CM | POA: Diagnosis not present

## 2022-02-06 DIAGNOSIS — G309 Alzheimer's disease, unspecified: Secondary | ICD-10-CM | POA: Diagnosis not present

## 2022-02-06 DIAGNOSIS — M6281 Muscle weakness (generalized): Secondary | ICD-10-CM | POA: Diagnosis not present

## 2022-02-06 DIAGNOSIS — R2681 Unsteadiness on feet: Secondary | ICD-10-CM | POA: Diagnosis not present

## 2022-02-08 DIAGNOSIS — Z9181 History of falling: Secondary | ICD-10-CM | POA: Diagnosis not present

## 2022-02-08 DIAGNOSIS — F419 Anxiety disorder, unspecified: Secondary | ICD-10-CM | POA: Diagnosis not present

## 2022-02-08 DIAGNOSIS — E440 Moderate protein-calorie malnutrition: Secondary | ICD-10-CM | POA: Diagnosis not present

## 2022-02-08 DIAGNOSIS — F32A Depression, unspecified: Secondary | ICD-10-CM | POA: Diagnosis not present

## 2022-02-13 DIAGNOSIS — R238 Other skin changes: Secondary | ICD-10-CM | POA: Diagnosis not present

## 2022-02-13 DIAGNOSIS — R2231 Localized swelling, mass and lump, right upper limb: Secondary | ICD-10-CM | POA: Diagnosis not present

## 2022-02-14 DIAGNOSIS — M545 Low back pain, unspecified: Secondary | ICD-10-CM | POA: Diagnosis not present

## 2022-02-14 DIAGNOSIS — Z9181 History of falling: Secondary | ICD-10-CM | POA: Diagnosis not present

## 2022-02-14 DIAGNOSIS — N189 Chronic kidney disease, unspecified: Secondary | ICD-10-CM | POA: Diagnosis not present

## 2022-02-14 DIAGNOSIS — K219 Gastro-esophageal reflux disease without esophagitis: Secondary | ICD-10-CM | POA: Diagnosis not present

## 2022-02-14 DIAGNOSIS — F0283 Dementia in other diseases classified elsewhere, unspecified severity, with mood disturbance: Secondary | ICD-10-CM | POA: Diagnosis not present

## 2022-02-14 DIAGNOSIS — N179 Acute kidney failure, unspecified: Secondary | ICD-10-CM | POA: Diagnosis not present

## 2022-02-14 DIAGNOSIS — S72012D Unspecified intracapsular fracture of left femur, subsequent encounter for closed fracture with routine healing: Secondary | ICD-10-CM | POA: Diagnosis not present

## 2022-02-14 DIAGNOSIS — Z96642 Presence of left artificial hip joint: Secondary | ICD-10-CM | POA: Diagnosis not present

## 2022-02-14 DIAGNOSIS — F0284 Dementia in other diseases classified elsewhere, unspecified severity, with anxiety: Secondary | ICD-10-CM | POA: Diagnosis not present

## 2022-02-14 DIAGNOSIS — M7060 Trochanteric bursitis, unspecified hip: Secondary | ICD-10-CM | POA: Diagnosis not present

## 2022-02-14 DIAGNOSIS — E039 Hypothyroidism, unspecified: Secondary | ICD-10-CM | POA: Diagnosis not present

## 2022-02-14 DIAGNOSIS — E785 Hyperlipidemia, unspecified: Secondary | ICD-10-CM | POA: Diagnosis not present

## 2022-02-14 DIAGNOSIS — F32A Depression, unspecified: Secondary | ICD-10-CM | POA: Diagnosis not present

## 2022-02-14 DIAGNOSIS — I129 Hypertensive chronic kidney disease with stage 1 through stage 4 chronic kidney disease, or unspecified chronic kidney disease: Secondary | ICD-10-CM | POA: Diagnosis not present

## 2022-02-14 DIAGNOSIS — G309 Alzheimer's disease, unspecified: Secondary | ICD-10-CM | POA: Diagnosis not present

## 2022-02-14 DIAGNOSIS — G4700 Insomnia, unspecified: Secondary | ICD-10-CM | POA: Diagnosis not present

## 2022-02-14 DIAGNOSIS — G8929 Other chronic pain: Secondary | ICD-10-CM | POA: Diagnosis not present

## 2022-02-15 DIAGNOSIS — M20021 Boutonniere deformity of right finger(s): Secondary | ICD-10-CM | POA: Diagnosis not present

## 2022-02-15 DIAGNOSIS — M79644 Pain in right finger(s): Secondary | ICD-10-CM | POA: Diagnosis not present

## 2022-02-19 DIAGNOSIS — N179 Acute kidney failure, unspecified: Secondary | ICD-10-CM | POA: Diagnosis not present

## 2022-02-19 DIAGNOSIS — N189 Chronic kidney disease, unspecified: Secondary | ICD-10-CM | POA: Diagnosis not present

## 2022-02-19 DIAGNOSIS — S72002A Fracture of unspecified part of neck of left femur, initial encounter for closed fracture: Secondary | ICD-10-CM | POA: Diagnosis not present

## 2022-02-19 DIAGNOSIS — N1831 Chronic kidney disease, stage 3a: Secondary | ICD-10-CM | POA: Diagnosis not present

## 2022-02-19 DIAGNOSIS — F02A0 Dementia in other diseases classified elsewhere, mild, without behavioral disturbance, psychotic disturbance, mood disturbance, and anxiety: Secondary | ICD-10-CM | POA: Diagnosis not present

## 2022-02-19 DIAGNOSIS — F339 Major depressive disorder, recurrent, unspecified: Secondary | ICD-10-CM | POA: Diagnosis not present

## 2022-02-19 DIAGNOSIS — M6281 Muscle weakness (generalized): Secondary | ICD-10-CM | POA: Diagnosis not present

## 2022-02-19 DIAGNOSIS — I129 Hypertensive chronic kidney disease with stage 1 through stage 4 chronic kidney disease, or unspecified chronic kidney disease: Secondary | ICD-10-CM | POA: Diagnosis not present

## 2022-02-19 DIAGNOSIS — M545 Low back pain, unspecified: Secondary | ICD-10-CM | POA: Diagnosis not present

## 2022-02-19 DIAGNOSIS — G301 Alzheimer's disease with late onset: Secondary | ICD-10-CM | POA: Diagnosis not present

## 2022-02-19 DIAGNOSIS — M7060 Trochanteric bursitis, unspecified hip: Secondary | ICD-10-CM | POA: Diagnosis not present

## 2022-02-19 DIAGNOSIS — S72012D Unspecified intracapsular fracture of left femur, subsequent encounter for closed fracture with routine healing: Secondary | ICD-10-CM | POA: Diagnosis not present

## 2022-02-20 DIAGNOSIS — R300 Dysuria: Secondary | ICD-10-CM | POA: Diagnosis not present

## 2022-02-21 DIAGNOSIS — N179 Acute kidney failure, unspecified: Secondary | ICD-10-CM | POA: Diagnosis not present

## 2022-02-21 DIAGNOSIS — S72012D Unspecified intracapsular fracture of left femur, subsequent encounter for closed fracture with routine healing: Secondary | ICD-10-CM | POA: Diagnosis not present

## 2022-02-21 DIAGNOSIS — M7060 Trochanteric bursitis, unspecified hip: Secondary | ICD-10-CM | POA: Diagnosis not present

## 2022-02-21 DIAGNOSIS — N189 Chronic kidney disease, unspecified: Secondary | ICD-10-CM | POA: Diagnosis not present

## 2022-02-21 DIAGNOSIS — L905 Scar conditions and fibrosis of skin: Secondary | ICD-10-CM | POA: Diagnosis not present

## 2022-02-21 DIAGNOSIS — M545 Low back pain, unspecified: Secondary | ICD-10-CM | POA: Diagnosis not present

## 2022-02-21 DIAGNOSIS — L821 Other seborrheic keratosis: Secondary | ICD-10-CM | POA: Diagnosis not present

## 2022-02-21 DIAGNOSIS — I129 Hypertensive chronic kidney disease with stage 1 through stage 4 chronic kidney disease, or unspecified chronic kidney disease: Secondary | ICD-10-CM | POA: Diagnosis not present

## 2022-02-25 DIAGNOSIS — N189 Chronic kidney disease, unspecified: Secondary | ICD-10-CM | POA: Diagnosis not present

## 2022-02-25 DIAGNOSIS — M79644 Pain in right finger(s): Secondary | ICD-10-CM | POA: Diagnosis not present

## 2022-02-25 DIAGNOSIS — I129 Hypertensive chronic kidney disease with stage 1 through stage 4 chronic kidney disease, or unspecified chronic kidney disease: Secondary | ICD-10-CM | POA: Diagnosis not present

## 2022-02-25 DIAGNOSIS — S72012D Unspecified intracapsular fracture of left femur, subsequent encounter for closed fracture with routine healing: Secondary | ICD-10-CM | POA: Diagnosis not present

## 2022-02-26 DIAGNOSIS — M7060 Trochanteric bursitis, unspecified hip: Secondary | ICD-10-CM | POA: Diagnosis not present

## 2022-02-26 DIAGNOSIS — N179 Acute kidney failure, unspecified: Secondary | ICD-10-CM | POA: Diagnosis not present

## 2022-02-26 DIAGNOSIS — S72012D Unspecified intracapsular fracture of left femur, subsequent encounter for closed fracture with routine healing: Secondary | ICD-10-CM | POA: Diagnosis not present

## 2022-02-26 DIAGNOSIS — I129 Hypertensive chronic kidney disease with stage 1 through stage 4 chronic kidney disease, or unspecified chronic kidney disease: Secondary | ICD-10-CM | POA: Diagnosis not present

## 2022-02-26 DIAGNOSIS — N189 Chronic kidney disease, unspecified: Secondary | ICD-10-CM | POA: Diagnosis not present

## 2022-02-26 DIAGNOSIS — M545 Low back pain, unspecified: Secondary | ICD-10-CM | POA: Diagnosis not present

## 2022-02-28 DIAGNOSIS — M545 Low back pain, unspecified: Secondary | ICD-10-CM | POA: Diagnosis not present

## 2022-02-28 DIAGNOSIS — N189 Chronic kidney disease, unspecified: Secondary | ICD-10-CM | POA: Diagnosis not present

## 2022-02-28 DIAGNOSIS — M7060 Trochanteric bursitis, unspecified hip: Secondary | ICD-10-CM | POA: Diagnosis not present

## 2022-02-28 DIAGNOSIS — N179 Acute kidney failure, unspecified: Secondary | ICD-10-CM | POA: Diagnosis not present

## 2022-02-28 DIAGNOSIS — S72012D Unspecified intracapsular fracture of left femur, subsequent encounter for closed fracture with routine healing: Secondary | ICD-10-CM | POA: Diagnosis not present

## 2022-02-28 DIAGNOSIS — I129 Hypertensive chronic kidney disease with stage 1 through stage 4 chronic kidney disease, or unspecified chronic kidney disease: Secondary | ICD-10-CM | POA: Diagnosis not present

## 2022-03-04 DIAGNOSIS — S72012D Unspecified intracapsular fracture of left femur, subsequent encounter for closed fracture with routine healing: Secondary | ICD-10-CM | POA: Diagnosis not present

## 2022-03-04 DIAGNOSIS — N179 Acute kidney failure, unspecified: Secondary | ICD-10-CM | POA: Diagnosis not present

## 2022-03-04 DIAGNOSIS — M7060 Trochanteric bursitis, unspecified hip: Secondary | ICD-10-CM | POA: Diagnosis not present

## 2022-03-04 DIAGNOSIS — I129 Hypertensive chronic kidney disease with stage 1 through stage 4 chronic kidney disease, or unspecified chronic kidney disease: Secondary | ICD-10-CM | POA: Diagnosis not present

## 2022-03-04 DIAGNOSIS — M545 Low back pain, unspecified: Secondary | ICD-10-CM | POA: Diagnosis not present

## 2022-03-04 DIAGNOSIS — N189 Chronic kidney disease, unspecified: Secondary | ICD-10-CM | POA: Diagnosis not present

## 2022-03-05 DIAGNOSIS — G301 Alzheimer's disease with late onset: Secondary | ICD-10-CM | POA: Diagnosis not present

## 2022-03-05 DIAGNOSIS — M20021 Boutonniere deformity of right finger(s): Secondary | ICD-10-CM | POA: Diagnosis not present

## 2022-03-05 DIAGNOSIS — F339 Major depressive disorder, recurrent, unspecified: Secondary | ICD-10-CM | POA: Diagnosis not present

## 2022-03-05 DIAGNOSIS — F02A0 Dementia in other diseases classified elsewhere, mild, without behavioral disturbance, psychotic disturbance, mood disturbance, and anxiety: Secondary | ICD-10-CM | POA: Diagnosis not present

## 2022-03-05 DIAGNOSIS — M159 Polyosteoarthritis, unspecified: Secondary | ICD-10-CM | POA: Diagnosis not present

## 2022-03-05 DIAGNOSIS — M79644 Pain in right finger(s): Secondary | ICD-10-CM | POA: Diagnosis not present

## 2022-03-06 DIAGNOSIS — M7060 Trochanteric bursitis, unspecified hip: Secondary | ICD-10-CM | POA: Diagnosis not present

## 2022-03-06 DIAGNOSIS — S72032A Displaced midcervical fracture of left femur, initial encounter for closed fracture: Secondary | ICD-10-CM | POA: Diagnosis not present

## 2022-03-06 DIAGNOSIS — N189 Chronic kidney disease, unspecified: Secondary | ICD-10-CM | POA: Diagnosis not present

## 2022-03-06 DIAGNOSIS — N179 Acute kidney failure, unspecified: Secondary | ICD-10-CM | POA: Diagnosis not present

## 2022-03-06 DIAGNOSIS — M7061 Trochanteric bursitis, right hip: Secondary | ICD-10-CM | POA: Diagnosis not present

## 2022-03-06 DIAGNOSIS — Z9181 History of falling: Secondary | ICD-10-CM | POA: Diagnosis not present

## 2022-03-06 DIAGNOSIS — I129 Hypertensive chronic kidney disease with stage 1 through stage 4 chronic kidney disease, or unspecified chronic kidney disease: Secondary | ICD-10-CM | POA: Diagnosis not present

## 2022-03-06 DIAGNOSIS — M545 Low back pain, unspecified: Secondary | ICD-10-CM | POA: Diagnosis not present

## 2022-03-06 DIAGNOSIS — F331 Major depressive disorder, recurrent, moderate: Secondary | ICD-10-CM | POA: Diagnosis not present

## 2022-03-06 DIAGNOSIS — S72012D Unspecified intracapsular fracture of left femur, subsequent encounter for closed fracture with routine healing: Secondary | ICD-10-CM | POA: Diagnosis not present

## 2022-03-06 DIAGNOSIS — E038 Other specified hypothyroidism: Secondary | ICD-10-CM | POA: Diagnosis not present

## 2022-03-06 DIAGNOSIS — Z4789 Encounter for other orthopedic aftercare: Secondary | ICD-10-CM | POA: Diagnosis not present

## 2022-03-06 DIAGNOSIS — M170 Bilateral primary osteoarthritis of knee: Secondary | ICD-10-CM | POA: Diagnosis not present

## 2022-03-08 DIAGNOSIS — N189 Chronic kidney disease, unspecified: Secondary | ICD-10-CM | POA: Diagnosis not present

## 2022-03-08 DIAGNOSIS — M545 Low back pain, unspecified: Secondary | ICD-10-CM | POA: Diagnosis not present

## 2022-03-08 DIAGNOSIS — N179 Acute kidney failure, unspecified: Secondary | ICD-10-CM | POA: Diagnosis not present

## 2022-03-08 DIAGNOSIS — I129 Hypertensive chronic kidney disease with stage 1 through stage 4 chronic kidney disease, or unspecified chronic kidney disease: Secondary | ICD-10-CM | POA: Diagnosis not present

## 2022-03-08 DIAGNOSIS — M7060 Trochanteric bursitis, unspecified hip: Secondary | ICD-10-CM | POA: Diagnosis not present

## 2022-03-08 DIAGNOSIS — S72012D Unspecified intracapsular fracture of left femur, subsequent encounter for closed fracture with routine healing: Secondary | ICD-10-CM | POA: Diagnosis not present

## 2022-03-11 DIAGNOSIS — I129 Hypertensive chronic kidney disease with stage 1 through stage 4 chronic kidney disease, or unspecified chronic kidney disease: Secondary | ICD-10-CM | POA: Diagnosis not present

## 2022-03-11 DIAGNOSIS — M7060 Trochanteric bursitis, unspecified hip: Secondary | ICD-10-CM | POA: Diagnosis not present

## 2022-03-11 DIAGNOSIS — N179 Acute kidney failure, unspecified: Secondary | ICD-10-CM | POA: Diagnosis not present

## 2022-03-11 DIAGNOSIS — S72012D Unspecified intracapsular fracture of left femur, subsequent encounter for closed fracture with routine healing: Secondary | ICD-10-CM | POA: Diagnosis not present

## 2022-03-11 DIAGNOSIS — N189 Chronic kidney disease, unspecified: Secondary | ICD-10-CM | POA: Diagnosis not present

## 2022-03-11 DIAGNOSIS — M545 Low back pain, unspecified: Secondary | ICD-10-CM | POA: Diagnosis not present

## 2022-03-12 DIAGNOSIS — M7060 Trochanteric bursitis, unspecified hip: Secondary | ICD-10-CM | POA: Diagnosis not present

## 2022-03-12 DIAGNOSIS — N189 Chronic kidney disease, unspecified: Secondary | ICD-10-CM | POA: Diagnosis not present

## 2022-03-12 DIAGNOSIS — M545 Low back pain, unspecified: Secondary | ICD-10-CM | POA: Diagnosis not present

## 2022-03-12 DIAGNOSIS — S72012D Unspecified intracapsular fracture of left femur, subsequent encounter for closed fracture with routine healing: Secondary | ICD-10-CM | POA: Diagnosis not present

## 2022-03-12 DIAGNOSIS — I129 Hypertensive chronic kidney disease with stage 1 through stage 4 chronic kidney disease, or unspecified chronic kidney disease: Secondary | ICD-10-CM | POA: Diagnosis not present

## 2022-03-12 DIAGNOSIS — M79644 Pain in right finger(s): Secondary | ICD-10-CM | POA: Diagnosis not present

## 2022-03-12 DIAGNOSIS — N179 Acute kidney failure, unspecified: Secondary | ICD-10-CM | POA: Diagnosis not present

## 2022-03-16 DIAGNOSIS — K219 Gastro-esophageal reflux disease without esophagitis: Secondary | ICD-10-CM | POA: Diagnosis not present

## 2022-03-16 DIAGNOSIS — N189 Chronic kidney disease, unspecified: Secondary | ICD-10-CM | POA: Diagnosis not present

## 2022-03-16 DIAGNOSIS — G8929 Other chronic pain: Secondary | ICD-10-CM | POA: Diagnosis not present

## 2022-03-16 DIAGNOSIS — Z9181 History of falling: Secondary | ICD-10-CM | POA: Diagnosis not present

## 2022-03-16 DIAGNOSIS — I129 Hypertensive chronic kidney disease with stage 1 through stage 4 chronic kidney disease, or unspecified chronic kidney disease: Secondary | ICD-10-CM | POA: Diagnosis not present

## 2022-03-16 DIAGNOSIS — E039 Hypothyroidism, unspecified: Secondary | ICD-10-CM | POA: Diagnosis not present

## 2022-03-16 DIAGNOSIS — Z96642 Presence of left artificial hip joint: Secondary | ICD-10-CM | POA: Diagnosis not present

## 2022-03-16 DIAGNOSIS — F0283 Dementia in other diseases classified elsewhere, unspecified severity, with mood disturbance: Secondary | ICD-10-CM | POA: Diagnosis not present

## 2022-03-16 DIAGNOSIS — M545 Low back pain, unspecified: Secondary | ICD-10-CM | POA: Diagnosis not present

## 2022-03-16 DIAGNOSIS — F0284 Dementia in other diseases classified elsewhere, unspecified severity, with anxiety: Secondary | ICD-10-CM | POA: Diagnosis not present

## 2022-03-16 DIAGNOSIS — G4700 Insomnia, unspecified: Secondary | ICD-10-CM | POA: Diagnosis not present

## 2022-03-16 DIAGNOSIS — F32A Depression, unspecified: Secondary | ICD-10-CM | POA: Diagnosis not present

## 2022-03-16 DIAGNOSIS — S72012D Unspecified intracapsular fracture of left femur, subsequent encounter for closed fracture with routine healing: Secondary | ICD-10-CM | POA: Diagnosis not present

## 2022-03-16 DIAGNOSIS — E785 Hyperlipidemia, unspecified: Secondary | ICD-10-CM | POA: Diagnosis not present

## 2022-03-16 DIAGNOSIS — M7060 Trochanteric bursitis, unspecified hip: Secondary | ICD-10-CM | POA: Diagnosis not present

## 2022-03-16 DIAGNOSIS — N179 Acute kidney failure, unspecified: Secondary | ICD-10-CM | POA: Diagnosis not present

## 2022-03-16 DIAGNOSIS — G309 Alzheimer's disease, unspecified: Secondary | ICD-10-CM | POA: Diagnosis not present

## 2022-03-18 DIAGNOSIS — M25561 Pain in right knee: Secondary | ICD-10-CM | POA: Diagnosis not present

## 2022-03-18 DIAGNOSIS — M79651 Pain in right thigh: Secondary | ICD-10-CM | POA: Diagnosis not present

## 2022-03-18 DIAGNOSIS — M25551 Pain in right hip: Secondary | ICD-10-CM | POA: Diagnosis not present

## 2022-03-18 DIAGNOSIS — N179 Acute kidney failure, unspecified: Secondary | ICD-10-CM | POA: Diagnosis not present

## 2022-03-18 DIAGNOSIS — M7060 Trochanteric bursitis, unspecified hip: Secondary | ICD-10-CM | POA: Diagnosis not present

## 2022-03-18 DIAGNOSIS — M545 Low back pain, unspecified: Secondary | ICD-10-CM | POA: Diagnosis not present

## 2022-03-18 DIAGNOSIS — S72012D Unspecified intracapsular fracture of left femur, subsequent encounter for closed fracture with routine healing: Secondary | ICD-10-CM | POA: Diagnosis not present

## 2022-03-18 DIAGNOSIS — N189 Chronic kidney disease, unspecified: Secondary | ICD-10-CM | POA: Diagnosis not present

## 2022-03-18 DIAGNOSIS — M79661 Pain in right lower leg: Secondary | ICD-10-CM | POA: Diagnosis not present

## 2022-03-18 DIAGNOSIS — I129 Hypertensive chronic kidney disease with stage 1 through stage 4 chronic kidney disease, or unspecified chronic kidney disease: Secondary | ICD-10-CM | POA: Diagnosis not present

## 2022-03-18 DIAGNOSIS — M79644 Pain in right finger(s): Secondary | ICD-10-CM | POA: Diagnosis not present

## 2022-03-19 DIAGNOSIS — R300 Dysuria: Secondary | ICD-10-CM | POA: Diagnosis not present

## 2022-03-19 DIAGNOSIS — F02A0 Dementia in other diseases classified elsewhere, mild, without behavioral disturbance, psychotic disturbance, mood disturbance, and anxiety: Secondary | ICD-10-CM | POA: Diagnosis not present

## 2022-03-19 DIAGNOSIS — G301 Alzheimer's disease with late onset: Secondary | ICD-10-CM | POA: Diagnosis not present

## 2022-03-19 DIAGNOSIS — W010XXA Fall on same level from slipping, tripping and stumbling without subsequent striking against object, initial encounter: Secondary | ICD-10-CM | POA: Diagnosis not present

## 2022-03-19 DIAGNOSIS — M159 Polyosteoarthritis, unspecified: Secondary | ICD-10-CM | POA: Diagnosis not present

## 2022-03-20 DIAGNOSIS — M545 Low back pain, unspecified: Secondary | ICD-10-CM | POA: Diagnosis not present

## 2022-03-20 DIAGNOSIS — I129 Hypertensive chronic kidney disease with stage 1 through stage 4 chronic kidney disease, or unspecified chronic kidney disease: Secondary | ICD-10-CM | POA: Diagnosis not present

## 2022-03-20 DIAGNOSIS — S72012D Unspecified intracapsular fracture of left femur, subsequent encounter for closed fracture with routine healing: Secondary | ICD-10-CM | POA: Diagnosis not present

## 2022-03-20 DIAGNOSIS — N189 Chronic kidney disease, unspecified: Secondary | ICD-10-CM | POA: Diagnosis not present

## 2022-03-20 DIAGNOSIS — N179 Acute kidney failure, unspecified: Secondary | ICD-10-CM | POA: Diagnosis not present

## 2022-03-20 DIAGNOSIS — M7060 Trochanteric bursitis, unspecified hip: Secondary | ICD-10-CM | POA: Diagnosis not present

## 2022-03-25 DIAGNOSIS — M79644 Pain in right finger(s): Secondary | ICD-10-CM | POA: Diagnosis not present

## 2022-03-26 DIAGNOSIS — N3020 Other chronic cystitis without hematuria: Secondary | ICD-10-CM | POA: Diagnosis not present

## 2022-03-26 DIAGNOSIS — F02A0 Dementia in other diseases classified elsewhere, mild, without behavioral disturbance, psychotic disturbance, mood disturbance, and anxiety: Secondary | ICD-10-CM | POA: Diagnosis not present

## 2022-03-26 DIAGNOSIS — N3281 Overactive bladder: Secondary | ICD-10-CM | POA: Diagnosis not present

## 2022-03-26 DIAGNOSIS — G301 Alzheimer's disease with late onset: Secondary | ICD-10-CM | POA: Diagnosis not present

## 2022-03-28 DIAGNOSIS — I129 Hypertensive chronic kidney disease with stage 1 through stage 4 chronic kidney disease, or unspecified chronic kidney disease: Secondary | ICD-10-CM | POA: Diagnosis not present

## 2022-03-28 DIAGNOSIS — N179 Acute kidney failure, unspecified: Secondary | ICD-10-CM | POA: Diagnosis not present

## 2022-03-28 DIAGNOSIS — M7060 Trochanteric bursitis, unspecified hip: Secondary | ICD-10-CM | POA: Diagnosis not present

## 2022-03-28 DIAGNOSIS — M545 Low back pain, unspecified: Secondary | ICD-10-CM | POA: Diagnosis not present

## 2022-03-28 DIAGNOSIS — N189 Chronic kidney disease, unspecified: Secondary | ICD-10-CM | POA: Diagnosis not present

## 2022-03-28 DIAGNOSIS — S72012D Unspecified intracapsular fracture of left femur, subsequent encounter for closed fracture with routine healing: Secondary | ICD-10-CM | POA: Diagnosis not present

## 2022-04-01 ENCOUNTER — Ambulatory Visit: Payer: Medicare Other | Admitting: Physician Assistant

## 2022-04-02 DIAGNOSIS — M159 Polyosteoarthritis, unspecified: Secondary | ICD-10-CM | POA: Diagnosis not present

## 2022-04-02 DIAGNOSIS — N189 Chronic kidney disease, unspecified: Secondary | ICD-10-CM | POA: Diagnosis not present

## 2022-04-02 DIAGNOSIS — F339 Major depressive disorder, recurrent, unspecified: Secondary | ICD-10-CM | POA: Diagnosis not present

## 2022-04-02 DIAGNOSIS — F02A0 Dementia in other diseases classified elsewhere, mild, without behavioral disturbance, psychotic disturbance, mood disturbance, and anxiety: Secondary | ICD-10-CM | POA: Diagnosis not present

## 2022-04-02 DIAGNOSIS — S72012D Unspecified intracapsular fracture of left femur, subsequent encounter for closed fracture with routine healing: Secondary | ICD-10-CM | POA: Diagnosis not present

## 2022-04-02 DIAGNOSIS — E038 Other specified hypothyroidism: Secondary | ICD-10-CM | POA: Diagnosis not present

## 2022-04-02 DIAGNOSIS — F331 Major depressive disorder, recurrent, moderate: Secondary | ICD-10-CM | POA: Diagnosis not present

## 2022-04-02 DIAGNOSIS — G301 Alzheimer's disease with late onset: Secondary | ICD-10-CM | POA: Diagnosis not present

## 2022-04-02 DIAGNOSIS — I129 Hypertensive chronic kidney disease with stage 1 through stage 4 chronic kidney disease, or unspecified chronic kidney disease: Secondary | ICD-10-CM | POA: Diagnosis not present

## 2022-04-02 DIAGNOSIS — M7060 Trochanteric bursitis, unspecified hip: Secondary | ICD-10-CM | POA: Diagnosis not present

## 2022-04-02 DIAGNOSIS — M545 Low back pain, unspecified: Secondary | ICD-10-CM | POA: Diagnosis not present

## 2022-04-02 DIAGNOSIS — M20021 Boutonniere deformity of right finger(s): Secondary | ICD-10-CM | POA: Diagnosis not present

## 2022-04-02 DIAGNOSIS — N179 Acute kidney failure, unspecified: Secondary | ICD-10-CM | POA: Diagnosis not present

## 2022-04-09 DIAGNOSIS — M545 Low back pain, unspecified: Secondary | ICD-10-CM | POA: Diagnosis not present

## 2022-04-09 DIAGNOSIS — I129 Hypertensive chronic kidney disease with stage 1 through stage 4 chronic kidney disease, or unspecified chronic kidney disease: Secondary | ICD-10-CM | POA: Diagnosis not present

## 2022-04-09 DIAGNOSIS — N189 Chronic kidney disease, unspecified: Secondary | ICD-10-CM | POA: Diagnosis not present

## 2022-04-09 DIAGNOSIS — S72012D Unspecified intracapsular fracture of left femur, subsequent encounter for closed fracture with routine healing: Secondary | ICD-10-CM | POA: Diagnosis not present

## 2022-04-09 DIAGNOSIS — N179 Acute kidney failure, unspecified: Secondary | ICD-10-CM | POA: Diagnosis not present

## 2022-04-09 DIAGNOSIS — M7060 Trochanteric bursitis, unspecified hip: Secondary | ICD-10-CM | POA: Diagnosis not present

## 2022-04-16 DIAGNOSIS — F02A0 Dementia in other diseases classified elsewhere, mild, without behavioral disturbance, psychotic disturbance, mood disturbance, and anxiety: Secondary | ICD-10-CM | POA: Diagnosis not present

## 2022-04-16 DIAGNOSIS — M159 Polyosteoarthritis, unspecified: Secondary | ICD-10-CM | POA: Diagnosis not present

## 2022-04-16 DIAGNOSIS — N3281 Overactive bladder: Secondary | ICD-10-CM | POA: Diagnosis not present

## 2022-04-16 DIAGNOSIS — G301 Alzheimer's disease with late onset: Secondary | ICD-10-CM | POA: Diagnosis not present

## 2022-04-27 DIAGNOSIS — R300 Dysuria: Secondary | ICD-10-CM | POA: Diagnosis not present

## 2022-04-30 DIAGNOSIS — E782 Mixed hyperlipidemia: Secondary | ICD-10-CM | POA: Diagnosis not present

## 2022-04-30 DIAGNOSIS — F331 Major depressive disorder, recurrent, moderate: Secondary | ICD-10-CM | POA: Diagnosis not present

## 2022-05-02 DIAGNOSIS — M20021 Boutonniere deformity of right finger(s): Secondary | ICD-10-CM | POA: Diagnosis not present

## 2022-05-14 DIAGNOSIS — I129 Hypertensive chronic kidney disease with stage 1 through stage 4 chronic kidney disease, or unspecified chronic kidney disease: Secondary | ICD-10-CM | POA: Diagnosis not present

## 2022-05-14 DIAGNOSIS — N1831 Chronic kidney disease, stage 3a: Secondary | ICD-10-CM | POA: Diagnosis not present

## 2022-05-14 DIAGNOSIS — F339 Major depressive disorder, recurrent, unspecified: Secondary | ICD-10-CM | POA: Diagnosis not present

## 2022-05-14 DIAGNOSIS — F02B0 Dementia in other diseases classified elsewhere, moderate, without behavioral disturbance, psychotic disturbance, mood disturbance, and anxiety: Secondary | ICD-10-CM | POA: Diagnosis not present

## 2022-05-14 DIAGNOSIS — G301 Alzheimer's disease with late onset: Secondary | ICD-10-CM | POA: Diagnosis not present

## 2022-05-14 DIAGNOSIS — K5900 Constipation, unspecified: Secondary | ICD-10-CM | POA: Diagnosis not present

## 2022-05-16 DIAGNOSIS — R300 Dysuria: Secondary | ICD-10-CM | POA: Diagnosis not present

## 2022-05-21 DIAGNOSIS — N3281 Overactive bladder: Secondary | ICD-10-CM | POA: Diagnosis not present

## 2022-05-22 DIAGNOSIS — M24541 Contracture, right hand: Secondary | ICD-10-CM | POA: Diagnosis not present

## 2022-05-27 DIAGNOSIS — B351 Tinea unguium: Secondary | ICD-10-CM | POA: Diagnosis not present

## 2022-05-27 DIAGNOSIS — M79672 Pain in left foot: Secondary | ICD-10-CM | POA: Diagnosis not present

## 2022-05-27 DIAGNOSIS — L600 Ingrowing nail: Secondary | ICD-10-CM | POA: Diagnosis not present

## 2022-05-27 DIAGNOSIS — M79671 Pain in right foot: Secondary | ICD-10-CM | POA: Diagnosis not present

## 2022-05-30 DIAGNOSIS — F331 Major depressive disorder, recurrent, moderate: Secondary | ICD-10-CM | POA: Diagnosis not present

## 2022-05-30 DIAGNOSIS — E782 Mixed hyperlipidemia: Secondary | ICD-10-CM | POA: Diagnosis not present

## 2022-05-30 DIAGNOSIS — K5904 Chronic idiopathic constipation: Secondary | ICD-10-CM | POA: Diagnosis not present

## 2022-05-30 DIAGNOSIS — E038 Other specified hypothyroidism: Secondary | ICD-10-CM | POA: Diagnosis not present

## 2022-06-10 DIAGNOSIS — E782 Mixed hyperlipidemia: Secondary | ICD-10-CM | POA: Diagnosis not present

## 2022-06-10 DIAGNOSIS — F331 Major depressive disorder, recurrent, moderate: Secondary | ICD-10-CM | POA: Diagnosis not present

## 2022-06-11 DIAGNOSIS — I129 Hypertensive chronic kidney disease with stage 1 through stage 4 chronic kidney disease, or unspecified chronic kidney disease: Secondary | ICD-10-CM | POA: Diagnosis not present

## 2022-06-11 DIAGNOSIS — K2100 Gastro-esophageal reflux disease with esophagitis, without bleeding: Secondary | ICD-10-CM | POA: Diagnosis not present

## 2022-06-11 DIAGNOSIS — E039 Hypothyroidism, unspecified: Secondary | ICD-10-CM | POA: Diagnosis not present

## 2022-06-11 DIAGNOSIS — N1831 Chronic kidney disease, stage 3a: Secondary | ICD-10-CM | POA: Diagnosis not present

## 2022-06-11 DIAGNOSIS — M25641 Stiffness of right hand, not elsewhere classified: Secondary | ICD-10-CM | POA: Diagnosis not present

## 2022-06-11 DIAGNOSIS — G301 Alzheimer's disease with late onset: Secondary | ICD-10-CM | POA: Diagnosis not present

## 2022-06-11 DIAGNOSIS — F02B0 Dementia in other diseases classified elsewhere, moderate, without behavioral disturbance, psychotic disturbance, mood disturbance, and anxiety: Secondary | ICD-10-CM | POA: Diagnosis not present

## 2022-06-18 DIAGNOSIS — M25641 Stiffness of right hand, not elsewhere classified: Secondary | ICD-10-CM | POA: Diagnosis not present

## 2022-06-19 DIAGNOSIS — R300 Dysuria: Secondary | ICD-10-CM | POA: Diagnosis not present

## 2022-07-09 DIAGNOSIS — N1831 Chronic kidney disease, stage 3a: Secondary | ICD-10-CM | POA: Diagnosis not present

## 2022-07-09 DIAGNOSIS — W010XXA Fall on same level from slipping, tripping and stumbling without subsequent striking against object, initial encounter: Secondary | ICD-10-CM | POA: Diagnosis not present

## 2022-07-09 DIAGNOSIS — I129 Hypertensive chronic kidney disease with stage 1 through stage 4 chronic kidney disease, or unspecified chronic kidney disease: Secondary | ICD-10-CM | POA: Diagnosis not present

## 2022-07-09 DIAGNOSIS — N3281 Overactive bladder: Secondary | ICD-10-CM | POA: Diagnosis not present

## 2022-07-09 DIAGNOSIS — K2100 Gastro-esophageal reflux disease with esophagitis, without bleeding: Secondary | ICD-10-CM | POA: Diagnosis not present

## 2022-07-17 DIAGNOSIS — H18523 Epithelial (juvenile) corneal dystrophy, bilateral: Secondary | ICD-10-CM | POA: Diagnosis not present

## 2022-07-30 DIAGNOSIS — K2100 Gastro-esophageal reflux disease with esophagitis, without bleeding: Secondary | ICD-10-CM | POA: Diagnosis not present

## 2022-07-30 DIAGNOSIS — F339 Major depressive disorder, recurrent, unspecified: Secondary | ICD-10-CM | POA: Diagnosis not present

## 2022-07-30 DIAGNOSIS — N1831 Chronic kidney disease, stage 3a: Secondary | ICD-10-CM | POA: Diagnosis not present

## 2022-07-30 DIAGNOSIS — I129 Hypertensive chronic kidney disease with stage 1 through stage 4 chronic kidney disease, or unspecified chronic kidney disease: Secondary | ICD-10-CM | POA: Diagnosis not present

## 2022-10-31 IMAGING — CT CT HEAD W/O CM
3 series · 17 of 37 positions shown, 19 images · non-contrast
Comparison: No pertinent prior exams available for comparison.

CLINICAL DATA: Provided history: Head trauma, minor.



[Series 2: head wo · axial · 0.42mm/px · z∈[-211,-91]mm · 7 of 34 slices shown, 9 images]
[im 5/34  brain]
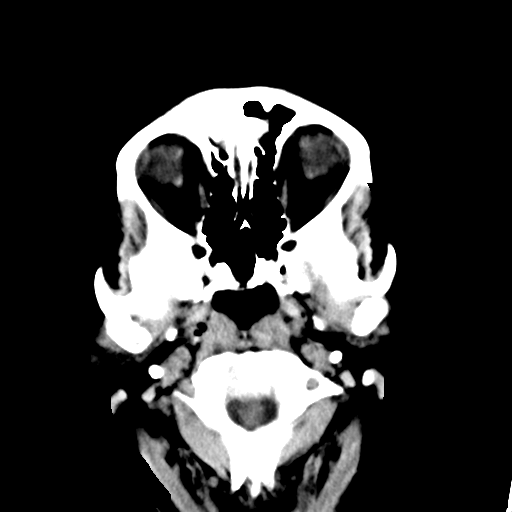
[im 5/34  bone]
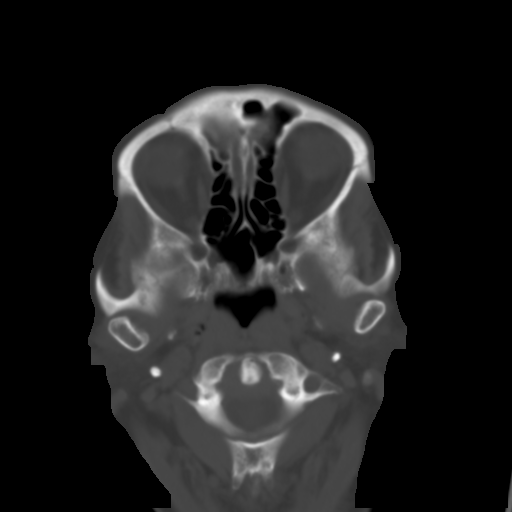
[im 9/34  brain]
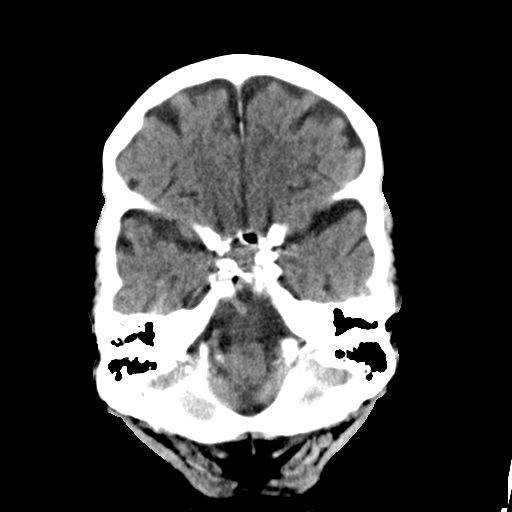
[im 13/34  brain]
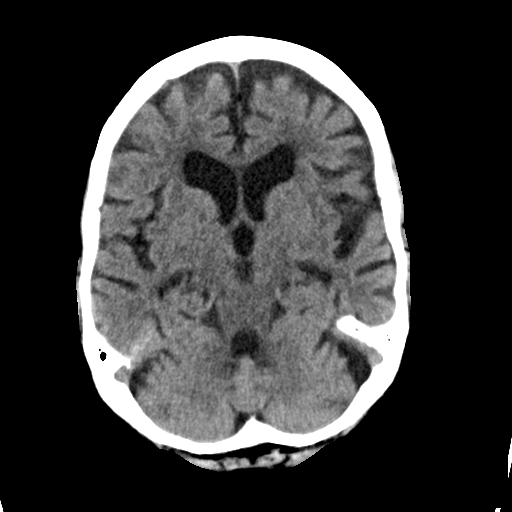
[im 17/34  brain]
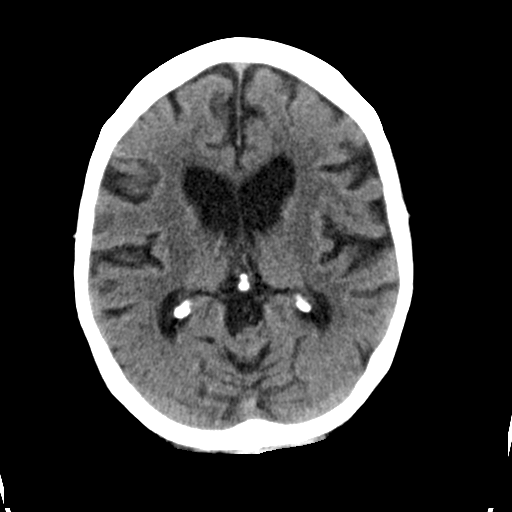
[im 21/34  brain]
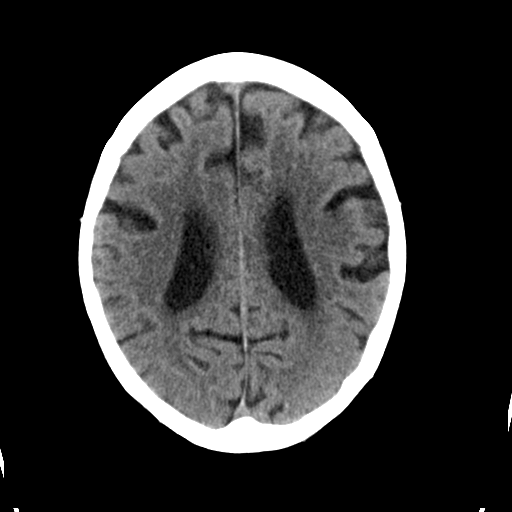
[im 21/34  bone]
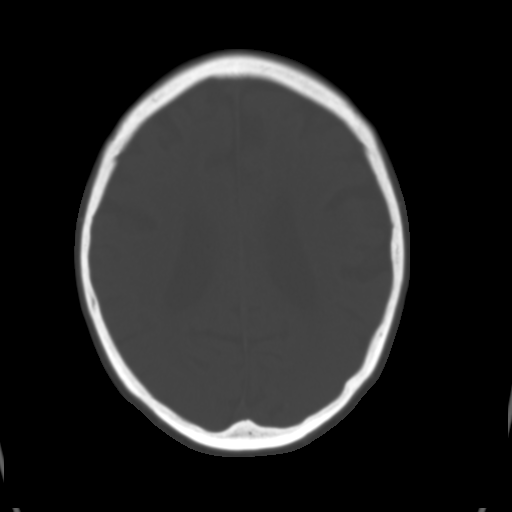
[im 25/34  brain]
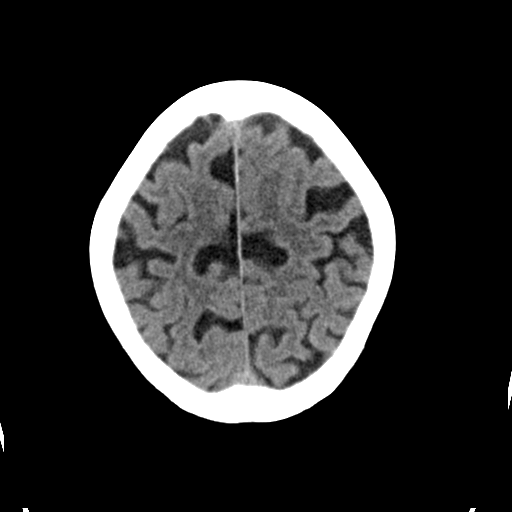
[im 29/34  brain]
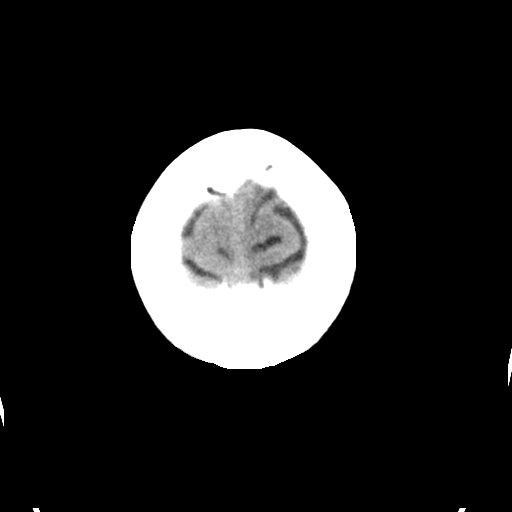

[Series 3: head bone · axial · 0.42mm/px · z∈[-215,-99]mm · 7 of 84 slices shown]
[im 9/84  bone]
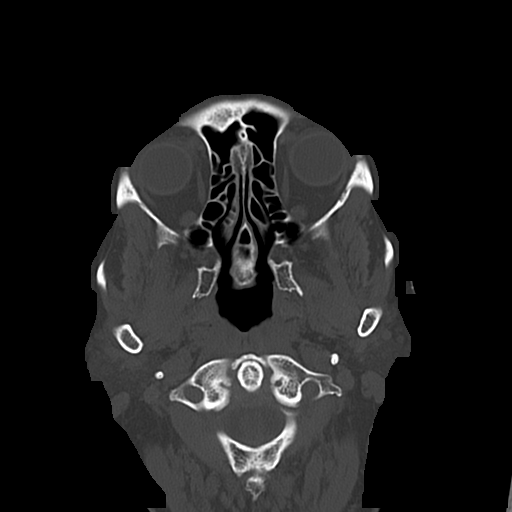
[im 17/84  bone]
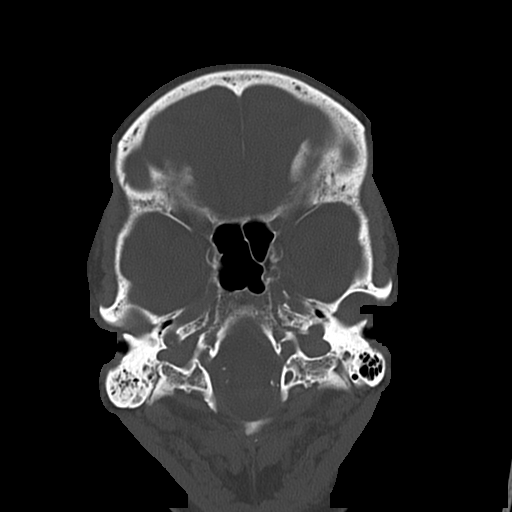
[im 25/84  bone]
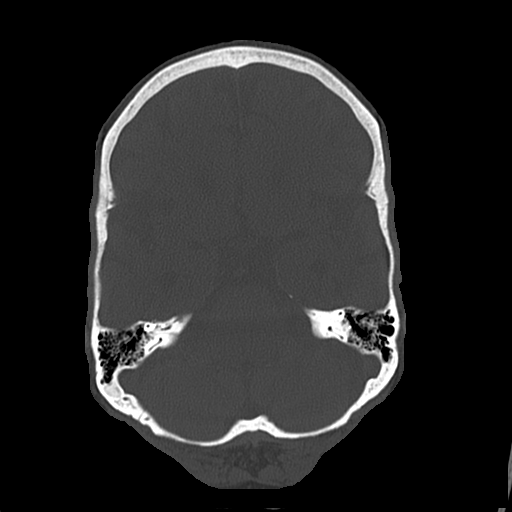
[im 38/84  bone]
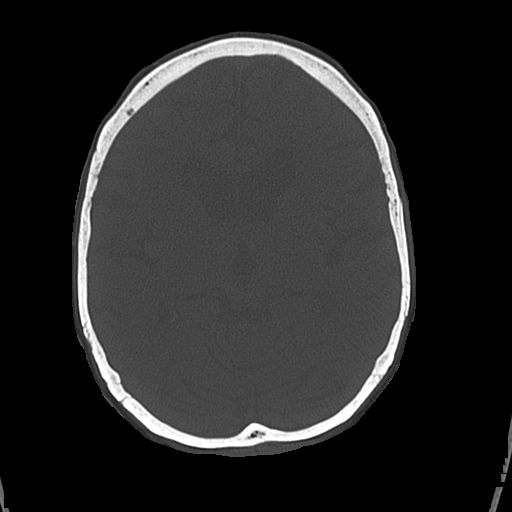
[im 46/84  bone]
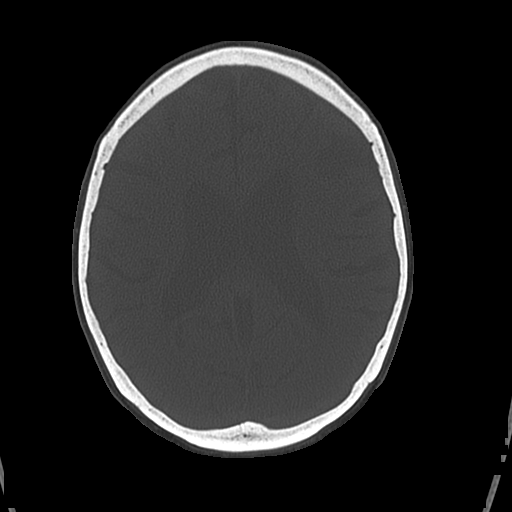
[im 59/84  bone]
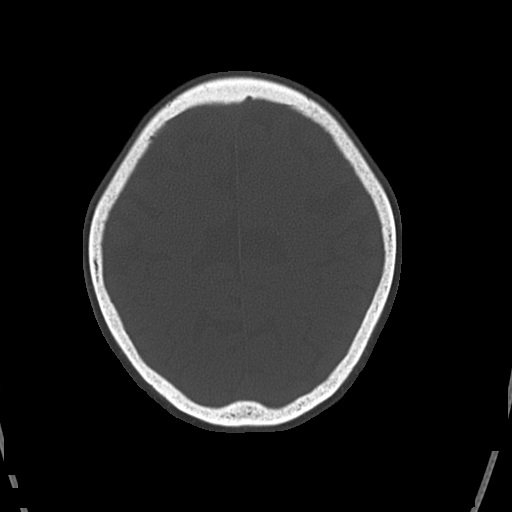
[im 67/84  bone]
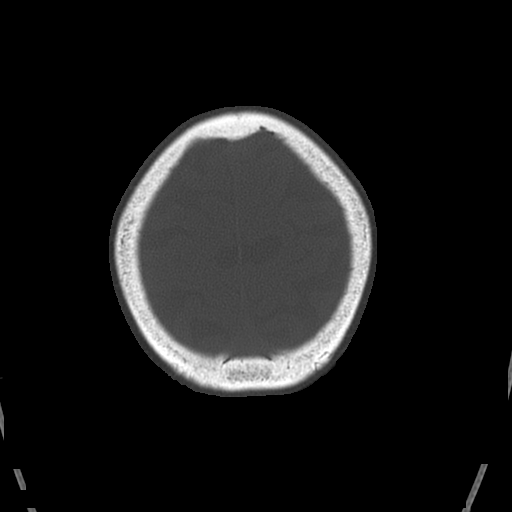

[Series 5: sagittal soft · sagittal · 0.36mm/px · 3 of 52 slices shown]
[im 18/52  brain]
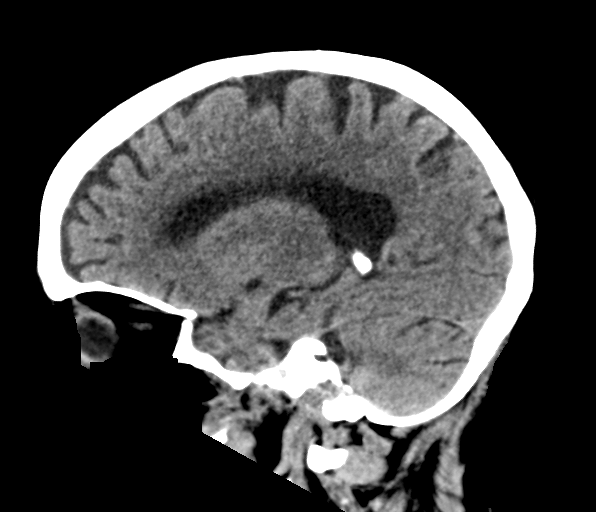
[im 26/52  brain]
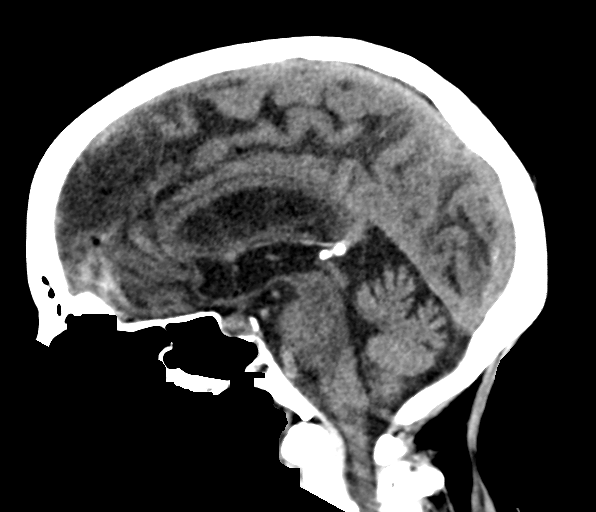
[im 35/52  brain]
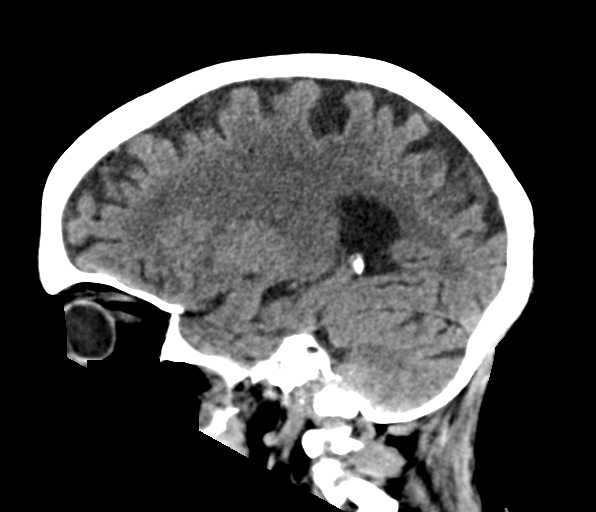

[17 of 37 positions shown; findings below may reference images not displayed]

FINDINGS: Brain:

Moderate cerebral atrophy. Comparatively mild cerebellar atrophy.

There is no acute intracranial hemorrhage.

No demarcated cortical infarct.

No extra-axial fluid collection.

No evidence of an intracranial mass.

No midline shift.

Vascular: No hyperdense vessel.  Atherosclerotic calcifications.

Skull: Normal. Negative for fracture or focal lesion.

Sinuses/Orbits: No mass or acute finding within the imaged orbits.
No significant paranasal sinus disease at the imaged levels.

Other: Small right mastoid effusion.
IMPRESSION: No evidence of acute intracranial abnormality.

Moderate cerebral atrophy. Comparatively mild cerebellar atrophy.

Small right mastoid effusion.

## 2022-12-03 ENCOUNTER — Emergency Department (HOSPITAL_COMMUNITY): Payer: Medicare Other

## 2022-12-03 ENCOUNTER — Other Ambulatory Visit: Payer: Self-pay

## 2022-12-03 ENCOUNTER — Encounter (HOSPITAL_COMMUNITY): Payer: Self-pay

## 2022-12-03 ENCOUNTER — Emergency Department (HOSPITAL_COMMUNITY)
Admission: EM | Admit: 2022-12-03 | Discharge: 2022-12-03 | Disposition: A | Discharge status: Home / Self Care | Payer: Medicare Other | Attending: Emergency Medicine | Admitting: Emergency Medicine

## 2022-12-03 DIAGNOSIS — Z23 Encounter for immunization: Secondary | ICD-10-CM | POA: Insufficient documentation

## 2022-12-03 DIAGNOSIS — S41112A Laceration without foreign body of left upper arm, initial encounter: Secondary | ICD-10-CM | POA: Diagnosis not present

## 2022-12-03 DIAGNOSIS — R8290 Unspecified abnormal findings in urine: Secondary | ICD-10-CM | POA: Insufficient documentation

## 2022-12-03 DIAGNOSIS — N390 Urinary tract infection, site not specified: Secondary | ICD-10-CM

## 2022-12-03 DIAGNOSIS — W010XXA Fall on same level from slipping, tripping and stumbling without subsequent striking against object, initial encounter: Secondary | ICD-10-CM | POA: Insufficient documentation

## 2022-12-03 DIAGNOSIS — I6782 Cerebral ischemia: Secondary | ICD-10-CM | POA: Diagnosis not present

## 2022-12-03 DIAGNOSIS — W19XXXA Unspecified fall, initial encounter: Secondary | ICD-10-CM

## 2022-12-03 DIAGNOSIS — M79632 Pain in left forearm: Secondary | ICD-10-CM | POA: Diagnosis present

## 2022-12-03 DIAGNOSIS — M47812 Spondylosis without myelopathy or radiculopathy, cervical region: Secondary | ICD-10-CM | POA: Diagnosis not present

## 2022-12-03 DIAGNOSIS — I10 Essential (primary) hypertension: Secondary | ICD-10-CM | POA: Insufficient documentation

## 2022-12-03 LAB — URINALYSIS, W/ REFLEX TO CULTURE (INFECTION SUSPECTED)
Bilirubin Urine: NEGATIVE
Glucose, UA: NEGATIVE mg/dL
Ketones, ur: NEGATIVE mg/dL
Nitrite: NEGATIVE
Protein, ur: 100 mg/dL — AB
RBC / HPF: >50 RBC/hpf (ref 0–5)
Specific Gravity, Urine: 1.025 (ref 1.005–1.030)
WBC, UA: >50 WBC/hpf (ref 0–5)
pH: 6 (ref 5.0–8.0)

## 2022-12-03 MED ORDER — TETANUS-DIPHTH-ACELL PERTUSSIS 5-2.5-18.5 LF-MCG/0.5 IM SUSY
0.5000 mL | PREFILLED_SYRINGE | Freq: Once | INTRAMUSCULAR | Status: AC
  Administered 2022-12-03: 0.5 mL via INTRAMUSCULAR
  Filled 2022-12-03: qty 0.5

## 2022-12-03 MED ORDER — LIDOCAINE-EPINEPHRINE (PF) 2 %-1:200000 IJ SOLN
20.0000 mL | Freq: Once | INTRAMUSCULAR | Status: AC
  Administered 2022-12-03: 20 mL
  Filled 2022-12-03: qty 20

## 2022-12-03 MED ORDER — CEPHALEXIN 500 MG PO CAPS: 500.0000 mg | ORAL_CAPSULE | Freq: Four times a day (QID) | ORAL | 0 refills | Status: DC

## 2022-12-03 MED ORDER — CEPHALEXIN 500 MG PO CAPS
500.0000 mg | ORAL_CAPSULE | Freq: Once | ORAL | Status: AC
  Administered 2022-12-03: 500 mg via ORAL
  Filled 2022-12-03: qty 1

## 2022-12-03 NOTE — Discharge Instructions (Addendum)
Return for any problem.  ?

## 2022-12-03 NOTE — ED Triage Notes (Signed)
[facility] [place] and cut Lt arm, no blood thinners, hit head has hematoma to back of head denies LOC, has neck bace for neck pain

## 2022-12-03 NOTE — ED Provider Notes (Signed)
[place] EMERGENCY DEPARTMENT AT [facility] Provider Note   CSN: [id number] Arrival date & time: 12/03/22  0309     History  Chief Complaint  Patient presents with   Michelle Jones is a 89 y.o. female.  HPI     This is a 89 year old female who presents following a fall.  Patient reports she was standing up when she turned around and lost her balance.  She fell hitting her arm.  She is unsure what she hit her arm arm.  She reports she did not hit her head or lose consciousness.  However, EMS reports she did hit her head.  She is not on any blood thinners.  She is only complaining of left forearm pain.  Of note, EMS noted a hematoma to the back of the head and neck pain although the patient denies this to me.  Home Medications Prior to Admission medications   Medication Sig Start Date End Date Taking? Authorizing Provider  acetaminophen (TYLENOL) 650 MG CR tablet Take 650 mg by mouth 2 (two) times daily.    [provider]  Benzocaine-Menthol (SORE THROAT LOZENGES MT) Use as directed 1 lozenge in the mouth or throat every 4 (four) hours as needed (sore throat).    [provider]  buPROPion (WELLBUTRIN XL) 300 MG 24 hr tablet 300 mg every morning. Take one tablet by mouth once daily for depression 10/03/14   [provider]  Coenzyme Q10 (COQ10) 200 MG CAPS Take 200 mg by mouth every morning.    [provider]  diclofenac Sodium (VOLTAREN) 1 % GEL Apply 4 g topically 4 (four) times daily. Patient taking differently: Apply 2 g topically every 8 (eight) hours as needed (painful joints). 09/28/21   Melene Plan, DO  docusate sodium (COLACE) 100 MG capsule Take 1 capsule (100 mg total) by mouth 2 (two) times daily. 01/16/22   [place], Alvira Philips, DO  DULoxetine (CYMBALTA) 60 MG capsule 60 mg every morning. 10/03/14   [provider]  famotidine (PEPCID) 40 MG tablet Take 40 mg by mouth every morning.    [provider]   ferrous sulfate 325 (65 FE) MG tablet Take 325 mg by mouth every morning.    [provider]  furosemide (LASIX) 20 MG tablet Take 20 mg by mouth every morning.    [provider]  guaiFENesin (MUCINEX) 600 MG 12 hr tablet Take 600 mg by mouth 2 (two) times daily.    [provider]  HYDROcodone-acetaminophen (NORCO/VICODIN) 5-325 MG tablet Take 1 tablet by mouth every 6 (six) hours as needed. 01/15/22   Cassandria Anger, PA-C  levothyroxine (SYNTHROID, LEVOTHROID) 100 MCG tablet Take 100 mcg by mouth every morning. 10/22/14   [provider]  Loperamide HCl 1 MG/7.5ML LIQD Take 2 mg by mouth every 6 (six) hours as needed (diarrhea).    [provider]  memantine (NAMENDA) 5 MG tablet . Take 1 tablet (5 mg at night) for 2 weeks, then increase to 1 tablet (5 mg) twice a day Patient taking differently: Take 5 mg by mouth See admin instructions. Take one tablet (5 mg) by mouth daily at bedtime for 2 weeks, then take 1 tablet (5 mg) twice daily 12/26/21   Gwynneth Munson, Sung Amabile, PA-C  methocarbamol (ROBAXIN) 500 MG tablet Take 1 tablet (500 mg total) by mouth every 8 (eight) hours as needed for muscle spasms. 01/15/22   Cassandria Anger, PA-C  metoprolol  succinate (TOPROL-XL) 100 MG 24 hr tablet Take 100 mg by mouth every morning. 12/31/21   [provider]  Multiple Vitamin (MULTIVITAMIN WITH MINERALS) TABS tablet Take 1 tablet by mouth every morning. Centrum Silver Women    [provider]  oxybutynin (DITROPAN) 5 MG tablet Take 5 mg by mouth 2 (two) times daily.    [provider]  raloxifene (EVISTA) 60 MG tablet Take 60 mg by mouth every morning. 12/31/21   [provider]  rosuvastatin (CRESTOR) 10 MG tablet Take 10 mg by mouth every Friday.    [provider]  senna-docusate (SENOKOT-S) 8.6-50 MG tablet Take 1 tablet by mouth daily as needed (constipation).    [provider]  SIMETHICONE PO Take 2 tablets by  mouth every 12 (twelve) hours as needed (heartburn/indigestion). Chewable - unknown strength    [provider]      Allergies    Nsaids, Penicillins, and Septra [sulfamethoxazole-trimethoprim]    Review of Systems   Review of Systems  Skin:  Positive for wound.  All other systems reviewed and are negative.   Physical Exam Updated Vital Signs BP 120/78   Pulse 73   Temp (!) 97.4 F (36.3 C) (Oral)   Resp 20   SpO2 95%  Physical Exam Vitals and nursing note reviewed.  Constitutional:      Appearance: She is well-developed. She is not ill-appearing.  HENT:     Head: Normocephalic and atraumatic.     Comments: No noted hematomas Eyes:     Pupils: Pupils are equal, round, and reactive to light.  Cardiovascular:     Rate and Rhythm: Normal rate and regular rhythm.     Heart sounds: Normal heart sounds.  Pulmonary:     Effort: Pulmonary effort is normal. No respiratory distress.     Breath sounds: No wheezing.  Abdominal:     Palpations: Abdomen is soft.  Musculoskeletal:     Cervical back: Neck supple.     Comments: Deep laceration to the right forearm approximately 8 to 10 cm and 1 cm in depth, no active bleeding  Skin:    General: Skin is warm and dry.  Neurological:     Mental Status: She is alert and oriented to person, place, and time.  Psychiatric:        Mood and Affect: Mood normal.     ED Results / Procedures / Treatments   Labs (all labs ordered are listed, but only abnormal results are displayed) Labs Reviewed  URINALYSIS, W/ REFLEX TO CULTURE (INFECTION SUSPECTED)    EKG None  Radiology CT Head Wo Contrast  Result Date: 12/03/2022 CLINICAL DATA:  Status post fall. EXAM: CT HEAD WITHOUT CONTRAST CT CERVICAL SPINE WITHOUT CONTRAST TECHNIQUE: Multidetector CT imaging of the head and cervical spine was performed following the standard protocol without intravenous contrast. Multiplanar CT image reconstructions of the cervical spine were also  generated. RADIATION DOSE REDUCTION: This exam was performed according to the departmental dose-optimization program which includes automated exposure control, adjustment of the mA and/or kV according to patient size and/or use of iterative reconstruction technique. COMPARISON:  01/14/2022 FINDINGS: CT HEAD FINDINGS Brain: There is no evidence for acute hemorrhage, hydrocephalus, mass lesion, or abnormal extra-axial fluid collection. No definite CT evidence for acute infarction. Diffuse loss of parenchymal volume is consistent with atrophy. Patchy low attenuation in the deep hemispheric and periventricular white matter is nonspecific, but likely reflects chronic microvascular ischemic demyelination. Vascular: No hyperdense vessel or  unexpected calcification. Skull: No evidence for fracture. No worrisome lytic or sclerotic lesion. Sinuses/Orbits: The visualized paranasal sinuses and mastoid air cells are clear. Visualized portions of the globes and intraorbital fat are unremarkable. Other: None. CT CERVICAL SPINE FINDINGS Alignment: Degenerative anterolisthesis noted at C3 on 4 and C4 on 5, stable in the interval. Straightening of normal cervical lordosis evident. Skull base and vertebrae: No acute fracture. No primary bone lesion or focal pathologic process. Soft tissues and spinal canal: No prevertebral fluid or swelling. No visible canal hematoma. Disc levels: Loss of disc height with endplate degeneration noted C5-6 and C6-7. Diffuse facet osteoarthritis noted bilaterally. Upper chest: Unremarkable. Other: None. IMPRESSION: 1. No acute intracranial abnormality. 2. Atrophy with chronic small vessel ischemic disease. 3. No evidence for cervical spine fracture or traumatic subluxation. 4. Degenerative changes in the cervical spine as above. Electronically Signed   By: Kennith Center M.D.   On: 12/03/2022 06:40   CT Cervical Spine Wo Contrast  Result Date: 12/03/2022 CLINICAL DATA:  Status post fall. EXAM: CT HEAD  WITHOUT CONTRAST CT CERVICAL SPINE WITHOUT CONTRAST TECHNIQUE: Multidetector CT imaging of the head and cervical spine was performed following the standard protocol without intravenous contrast. Multiplanar CT image reconstructions of the cervical spine were also generated. RADIATION DOSE REDUCTION: This exam was performed according to the departmental dose-optimization program which includes automated exposure control, adjustment of the mA and/or kV according to patient size and/or use of iterative reconstruction technique. COMPARISON:  01/14/2022 FINDINGS: CT HEAD FINDINGS Brain: There is no evidence for acute hemorrhage, hydrocephalus, mass lesion, or abnormal extra-axial fluid collection. No definite CT evidence for acute infarction. Diffuse loss of parenchymal volume is consistent with atrophy. Patchy low attenuation in the deep hemispheric and periventricular white matter is nonspecific, but likely reflects chronic microvascular ischemic demyelination. Vascular: No hyperdense vessel or unexpected calcification. Skull: No evidence for fracture. No worrisome lytic or sclerotic lesion. Sinuses/Orbits: The visualized paranasal sinuses and mastoid air cells are clear. Visualized portions of the globes and intraorbital fat are unremarkable. Other: None. CT CERVICAL SPINE FINDINGS Alignment: Degenerative anterolisthesis noted at C3 on 4 and C4 on 5, stable in the interval. Straightening of normal cervical lordosis evident. Skull base and vertebrae: No acute fracture. No primary bone lesion or focal pathologic process. Soft tissues and spinal canal: No prevertebral fluid or swelling. No visible canal hematoma. Disc levels: Loss of disc height with endplate degeneration noted C5-6 and C6-7. Diffuse facet osteoarthritis noted bilaterally. Upper chest: Unremarkable. Other: None. IMPRESSION: 1. No acute intracranial abnormality. 2. Atrophy with chronic small vessel ischemic disease. 3. No evidence for cervical spine  fracture or traumatic subluxation. 4. Degenerative changes in the cervical spine as above. Electronically Signed   By: Kennith Center M.D.   On: 12/03/2022 06:40   DG Forearm Left  Result Date: 12/03/2022 CLINICAL DATA:  Laceration after fall EXAM: LEFT FOREARM - 2 VIEW COMPARISON:  None Available. FINDINGS: Skin defect at the mid forearm correlating with the history. No opaque foreign body, fracture, or dislocation. Subjective osteopenia. IMPRESSION: No fracture or opaque foreign body at the forearm wound. Electronically Signed   By: Tiburcio Pea M.D.   On: 12/03/2022 04:28    Procedures .Marland KitchenLaceration Repair  Date/Time: 12/03/2022 5:55 AM  Performed by: Shon Baton, MD Authorized by: Shon Baton, MD   Consent:    Consent obtained:  Verbal   Consent given by:  Patient   Risks, benefits, and alternatives were discussed: yes  Risks discussed:  Infection, pain, poor cosmetic result and poor wound healing   Alternatives discussed:  No treatment Anesthesia:    Anesthesia method:  Local infiltration   Local anesthetic:  Lidocaine 2% WITH epi Laceration details:    Location:  Shoulder/arm   Shoulder/arm location:  L lower arm   Length (cm):  10   Depth (mm):  10 Pre-procedure details:    Preparation:  Patient was prepped and draped in usual sterile fashion Exploration:    Limited defect created (wound extended): no     Imaging obtained: x-ray     Imaging outcome: foreign body not noted     Wound exploration: wound explored through full range of motion     Wound extent: areolar tissue not violated, fascia not violated, no foreign body, no signs of injury, no nerve damage, no tendon damage, no underlying fracture and no vascular damage     Contaminated: no   Treatment:    Area cleansed with:  Povidone-iodine   Amount of cleaning:  Standard   Irrigation solution:  Sterile saline   Irrigation method:  Pressure wash   Visualized foreign bodies/material removed: no      Debridement:  None   Undermining:  None   Layers/structures repaired:  Deep subcutaneous Deep subcutaneous:    Suture size:  3-0   Suture material:  Vicryl   Suture technique:  Simple interrupted   Number of sutures:  2 Skin repair:    Repair method:  Sutures   Suture size:  4-0   Suture material:  Nylon   Suture technique:  Simple interrupted and horizontal mattress   Number of sutures:  7 Approximation:    Approximation:  Close Repair type:    Repair type:  Intermediate Post-procedure details:    Dressing:  Bulky dressing   Procedure completion:  Tolerated     Medications Ordered in ED Medications  lidocaine-EPINEPHrine (XYLOCAINE W/EPI) 2 %-1:200000 (PF) injection 20 mL (20 mLs Infiltration Given 12/03/22 0449)  Tdap (BOOSTRIX) injection 0.5 mL (0.5 mLs Intramuscular Given 12/03/22 0448)    ED Course/ Medical Decision Making/ A&P Clinical Course as of 12/03/22 0645  Tue Dec 03, 2022  0600 Son at bedside concerned about possible UTI.  Will obtain urinalysis. [CH]    Clinical Course User Index [CH] Jad Johansson, Mayer Masker, MD                             Medical Decision Making Amount and/or Complexity of Data Reviewed Radiology: ordered.  Risk Prescription drug management.   This patient presents to the ED for concern of fall, this involves an extensive number of treatment options, and is a complaint that carries with it a high risk of complications and morbidity.  I considered the following differential and admission for this acute, potentially life threatening condition.  The differential diagnosis includes laceration, fracture, hematoma  MDM:    This is a 89 year old female who presents following an unwitnessed fall.  She reports that she did not hit her head.  She is awake and alert and provides history; however, EMS reports that they are concerned she did hit her head.  I do not feel an obvious hematoma.  She does have an obvious laceration to the left forearm.   X-rays do not show deformity or foreign body.  CT head and neck obtained and reviewed.  No significant traumatic head or cervical spine injury.  Laceration was repaired at  the bedside.  She will need follow-up for suture removal.  Son is concerned about a possible UTI.  Urinalysis was added on.  Anticipate she will be discharged home. (Labs, imaging, consults)  Labs: I Ordered, and personally interpreted labs.  The pertinent results include: Urinalysis pending  Imaging Studies ordered: I ordered imaging studies including x-ray, CT head neck I independently visualized and interpreted imaging. I agree with the radiologist interpretation  Additional history obtained from son, EMS.  External records from outside source obtained and reviewed including prior evaluations  Cardiac Monitoring: The patient was not maintained on a cardiac monitor.  If on the cardiac monitor, I personally viewed and interpreted the cardiac monitored which showed an underlying rhythm of: N/A  Reevaluation: After the interventions noted above, I reevaluated the patient and found that they have :improved  Social Determinants of Health:  lives in assisted living  Disposition: Likely discharge  Co morbidities that complicate the patient evaluation  Past Medical History:  Diagnosis Date   GERD (gastroesophageal reflux disease)    Hypertension      Medicines Meds ordered this encounter  Medications   lidocaine-EPINEPHrine (XYLOCAINE W/EPI) 2 %-1:200000 (PF) injection 20 mL   Tdap (BOOSTRIX) injection 0.5 mL    I have reviewed the patients home medicines and have made adjustments as needed  Problem List / ED Course: Problem List Items Addressed This Visit   None               Final Clinical Impression(s) / ED Diagnoses Final diagnoses:  None    Rx / DC Orders ED Discharge Orders     None         Beauden Tremont, Mayer Masker, MD 12/03/22 [phone]

## 2022-12-03 NOTE — ED Provider Notes (Signed)
Seen after prior ED provider.  Patient is comfortable.  Patient's family understands results of ED evaluation.  Patient with urine suggestive of UTI.  Will provide with prescription to treat.  Importance of close follow-up was stressed.  Strict return precautions given and understood.   Wynetta Fines, MD 12/03/22 [phone]

## 2022-12-04 LAB — URINE CULTURE

## 2022-12-09 ENCOUNTER — Emergency Department (HOSPITAL_COMMUNITY): Payer: Medicare Other

## 2022-12-09 ENCOUNTER — Emergency Department (HOSPITAL_COMMUNITY)
Admission: EM | Admit: 2022-12-09 | Discharge: 2022-12-10 | Disposition: A | Discharge status: Home / Self Care | Payer: Medicare Other | Attending: Emergency Medicine | Admitting: Emergency Medicine

## 2022-12-09 DIAGNOSIS — W19XXXA Unspecified fall, initial encounter: Secondary | ICD-10-CM | POA: Insufficient documentation

## 2022-12-09 DIAGNOSIS — F0390 Unspecified dementia without behavioral disturbance: Secondary | ICD-10-CM | POA: Diagnosis not present

## 2022-12-09 DIAGNOSIS — S0003XA Contusion of scalp, initial encounter: Secondary | ICD-10-CM | POA: Diagnosis not present

## 2022-12-09 DIAGNOSIS — S51812A Laceration without foreign body of left forearm, initial encounter: Secondary | ICD-10-CM | POA: Diagnosis not present

## 2022-12-09 DIAGNOSIS — S065XAA Traumatic subdural hemorrhage with loss of consciousness status unknown, initial encounter: Secondary | ICD-10-CM

## 2022-12-09 DIAGNOSIS — I10 Essential (primary) hypertension: Secondary | ICD-10-CM | POA: Diagnosis not present

## 2022-12-09 DIAGNOSIS — S0990XA Unspecified injury of head, initial encounter: Secondary | ICD-10-CM | POA: Diagnosis present

## 2022-12-09 DIAGNOSIS — Z79899 Other long term (current) drug therapy: Secondary | ICD-10-CM | POA: Diagnosis not present

## 2022-12-09 LAB — CBC WITH DIFFERENTIAL/PLATELET
Abs Immature Granulocytes: 0.04 10*3/uL (ref 0.00–0.07)
Basophils Absolute: 0 10*3/uL (ref 0.0–0.1)
Basophils Relative: 0 %
Eosinophils Absolute: 0.1 10*3/uL (ref 0.0–0.5)
Eosinophils Relative: 1 %
HCT: 38.1 % (ref 36.0–46.0)
Hemoglobin: 12.5 g/dL (ref 12.0–15.0)
Immature Granulocytes: 0 %
Lymphocytes Relative: 20 %
Lymphs Abs: 1.8 10*3/uL (ref 0.7–4.0)
MCH: 33.3 pg (ref 26.0–34.0)
MCHC: 32.8 g/dL (ref 30.0–36.0)
MCV: 101.6 fL — ABNORMAL HIGH (ref 80.0–100.0)
Monocytes Absolute: 0.8 10*3/uL (ref 0.1–1.0)
Monocytes Relative: 8 %
Neutro Abs: 6.6 10*3/uL (ref 1.7–7.7)
Neutrophils Relative %: 71 %
Platelets: 203 10*3/uL (ref 150–400)
RBC: 3.75 MIL/uL — ABNORMAL LOW (ref 3.87–5.11)
RDW: 12.3 % (ref 11.5–15.5)
WBC: 9.3 10*3/uL (ref 4.0–10.5)
nRBC: 0 % (ref 0.0–0.2)

## 2022-12-09 LAB — COMPREHENSIVE METABOLIC PANEL
ALT: 35 U/L (ref 0–44)
AST: 39 U/L (ref 15–41)
Albumin: 3.4 g/dL — ABNORMAL LOW (ref 3.5–5.0)
Alkaline Phosphatase: 45 U/L (ref 38–126)
Anion gap: 12 (ref 5–15)
BUN: 24 mg/dL — ABNORMAL HIGH (ref 8–23)
CO2: 25 mmol/L (ref 22–32)
Calcium: 9.2 mg/dL (ref 8.9–10.3)
Chloride: 99 mmol/L (ref 98–111)
Creatinine, Ser: 1.29 mg/dL — ABNORMAL HIGH (ref 0.44–1.00)
GFR, Estimated: 39 mL/min — ABNORMAL LOW (ref >60)
Glucose, Bld: 135 mg/dL — ABNORMAL HIGH (ref 70–99)
Potassium: 4.2 mmol/L (ref 3.5–5.1)
Sodium: 136 mmol/L (ref 135–145)
Total Bilirubin: 0.6 mg/dL (ref 0.3–1.2)
Total Protein: 6.5 g/dL (ref 6.5–8.1)

## 2022-12-09 MED ORDER — ONDANSETRON HCL 4 MG/2ML IJ SOLN
4.0000 mg | Freq: Once | INTRAMUSCULAR | Status: AC
  Administered 2022-12-09: 4 mg via INTRAVENOUS
  Filled 2022-12-09: qty 2

## 2022-12-09 NOTE — ED Provider Notes (Signed)
[place] EMERGENCY DEPARTMENT AT [facility] Provider Note   CSN: [id number] Arrival date & time: 12/09/22  2016     History  Chief Complaint  Patient presents with   Ria Comment is a 89 y.o. female history of dementia, hypertension, recurrent UTI here presenting with fall.  Patient is from a nursing home and had an unwitnessed fall.  She was seen here on 4/23 after a fall and had left forearm laceration that was repaired.  Patient cannot tell me how she fell.  She states that she did hit her head.  The history is provided by the patient.       Home Medications Prior to Admission medications   Medication Sig Start Date End Date Taking? Authorizing Provider  acetaminophen (TYLENOL) 650 MG CR tablet Take 650 mg by mouth 2 (two) times daily.    [provider]  Benzocaine-Menthol (SORE THROAT LOZENGES MT) Use as directed 1 lozenge in the mouth or throat every 4 (four) hours as needed (sore throat).    [provider]  buPROPion (WELLBUTRIN XL) 300 MG 24 hr tablet 300 mg every morning. Take one tablet by mouth once daily for depression 10/03/14   [provider]  cephALEXin (KEFLEX) 500 MG capsule Take 1 capsule (500 mg total) by mouth 4 (four) times daily. 12/03/22   Wynetta Fines, MD  Coenzyme Q10 (COQ10) 200 MG CAPS Take 200 mg by mouth every morning.    [provider]  diclofenac Sodium (VOLTAREN) 1 % GEL Apply 4 g topically 4 (four) times daily. Patient taking differently: Apply 2 g topically every 8 (eight) hours as needed (painful joints). 09/28/21   Melene Plan, DO  docusate sodium (COLACE) 100 MG capsule Take 1 capsule (100 mg total) by mouth 2 (two) times daily. 01/16/22   [place], Alvira Philips, DO  DULoxetine (CYMBALTA) 60 MG capsule 60 mg every morning. 10/03/14   [provider]  famotidine (PEPCID) 40 MG tablet Take 40 mg by mouth every morning.    [provider]  ferrous sulfate 325 (65 FE) MG tablet  Take 325 mg by mouth every morning.    [provider]  furosemide (LASIX) 20 MG tablet Take 20 mg by mouth every morning.    [provider]  guaiFENesin (MUCINEX) 600 MG 12 hr tablet Take 600 mg by mouth 2 (two) times daily.    [provider]  HYDROcodone-acetaminophen (NORCO/VICODIN) 5-325 MG tablet Take 1 tablet by mouth every 6 (six) hours as needed. 01/15/22   Cassandria Anger, PA-C  levothyroxine (SYNTHROID, LEVOTHROID) 100 MCG tablet Take 100 mcg by mouth every morning. 10/22/14   [provider]  Loperamide HCl 1 MG/7.5ML LIQD Take 2 mg by mouth every 6 (six) hours as needed (diarrhea).    [provider]  memantine (NAMENDA) 5 MG tablet . Take 1 tablet (5 mg at night) for 2 weeks, then increase to 1 tablet (5 mg) twice a day Patient taking differently: Take 5 mg by mouth See admin instructions. Take one tablet (5 mg) by mouth daily at bedtime for 2 weeks, then take 1 tablet (5 mg) twice daily 12/26/21   Gwynneth Munson, Sung Amabile, PA-C  methocarbamol (ROBAXIN) 500 MG tablet Take 1 tablet (500 mg total) by mouth every 8 (eight) hours as needed for muscle spasms. 01/15/22   Cassandria Anger, PA-C  metoprolol succinate (TOPROL-XL) 100 MG 24 hr tablet Take 100 mg by mouth every  morning. 12/31/21   [provider]  Multiple Vitamin (MULTIVITAMIN WITH MINERALS) TABS tablet Take 1 tablet by mouth every morning. Centrum Silver Women    [provider]  oxybutynin (DITROPAN) 5 MG tablet Take 5 mg by mouth 2 (two) times daily.    [provider]  raloxifene (EVISTA) 60 MG tablet Take 60 mg by mouth every morning. 12/31/21   [provider]  rosuvastatin (CRESTOR) 10 MG tablet Take 10 mg by mouth every Friday.    [provider]  senna-docusate (SENOKOT-S) 8.6-50 MG tablet Take 1 tablet by mouth daily as needed (constipation).    [provider]  SIMETHICONE PO Take 2 tablets by mouth every 12 (twelve) hours as needed  (heartburn/indigestion). Chewable - unknown strength    [provider]      Allergies    Nsaids, Penicillins, and Septra [sulfamethoxazole-trimethoprim]    Review of Systems   Review of Systems  Psychiatric/Behavioral:  Positive for confusion.   All other systems reviewed and are negative.   Physical Exam Updated Vital Signs BP (!) 153/55 (BP Location: Left Arm)   Pulse (!) 57   Temp 97.6 F (36.4 C) (Oral)   Resp 15   SpO2 100%  Physical Exam Vitals and nursing note reviewed.  HENT:     Head:     Comments: Left scalp hematoma    Nose: Nose normal.     Mouth/Throat:     Mouth: Mucous membranes are moist.  Eyes:     Extraocular Movements: Extraocular movements intact.     Pupils: Pupils are equal, round, and reactive to light.  Neck:     Comments: C-collar in place Cardiovascular:     Rate and Rhythm: Normal rate and regular rhythm.     Pulses: Normal pulses.     Heart sounds: Normal heart sounds.  Pulmonary:     Effort: Pulmonary effort is normal.     Breath sounds: Normal breath sounds.  Abdominal:     General: Abdomen is flat.     Palpations: Abdomen is soft.  Musculoskeletal:     Comments: Normal range of motion bilateral hips.  No spinal tenderness.  Patient does have multiple sutures in the left forearm.  They are healing well.  No signs of wound infection  Skin:    General: Skin is warm.     Capillary Refill: Capillary refill takes less than 2 seconds.  Neurological:     Comments: Demented but moving all extremities  Psychiatric:     Comments: Calm      ED Results / Procedures / Treatments   Labs (all labs ordered are listed, but only abnormal results are displayed) Labs Reviewed  CBC WITH DIFFERENTIAL/PLATELET  COMPREHENSIVE METABOLIC PANEL    EKG None  Radiology No results found.  Procedures Procedures    Medications Ordered in ED Medications - No data to display  ED Course/ Medical Decision Making/ A&P                              Medical Decision Making ELOYCE BULTMAN is a 89 y.o. female history of dementia here presenting with a fall.  Patient had an unwitnessed fall at the facility.  Patient also had a laceration that was repaired 6 days ago in the left forearm.  There is no signs of wound infection and patient will need to return in 4 days for suture removal.  Since she  has a follow-up with a head injury, will get CT head and cervical spine and chest and pelvis x-rays.  Will also get basic blood work.  10 pm Patient had a small left subdural hematoma.  Discussed case with Dr. Dutch Quint from neurosurgery.  He states that is very small in size and recommend repeat CT head in 6 hours.  11:20 PM Labs unremarkable. CT head ordered for 3:30 am. Signed out to Dr. Bebe Shaggy in the ED.   Amount and/or Complexity of Data Reviewed Labs: ordered. Radiology: ordered.  Risk Prescription drug management.    Final Clinical Impression(s) / ED Diagnoses Final diagnoses:  None    Rx / DC Orders ED Discharge Orders     None         Charlynne Pander, MD 12/09/22 2321

## 2022-12-09 NOTE — ED Notes (Signed)
Patient transported to CT 

## 2022-12-09 NOTE — ED Triage Notes (Signed)
Pt to ED via EMS from [facility]. Pt has unwitnessed fall, unsure of how she fell, not on blood thinners. Facility staff states she was sitting in a chair and when staff looked away and looked back she was on the floor. Unsure of LOC. Pt in c-collar upon arrival to ED. Pt has hematoma to left forehead. Pt AAOx4. Pt denies pain.   EMS Vitals: 155/66 60 HR 95% RA 98 CBG

## 2022-12-10 ENCOUNTER — Encounter (HOSPITAL_COMMUNITY): Payer: Self-pay

## 2022-12-10 ENCOUNTER — Other Ambulatory Visit: Payer: Self-pay

## 2022-12-10 ENCOUNTER — Observation Stay (HOSPITAL_COMMUNITY): Payer: Medicare Other

## 2022-12-10 ENCOUNTER — Observation Stay (HOSPITAL_COMMUNITY)
Admission: EM | Admit: 2022-12-10 | Discharge: 2022-12-12 | Disposition: A | Discharge status: Skilled Nursing Facility | Payer: Medicare Other | Attending: Family Medicine | Admitting: Family Medicine

## 2022-12-10 ENCOUNTER — Emergency Department (HOSPITAL_COMMUNITY): Payer: Medicare Other

## 2022-12-10 DIAGNOSIS — R4182 Altered mental status, unspecified: Secondary | ICD-10-CM | POA: Diagnosis present

## 2022-12-10 DIAGNOSIS — Z96642 Presence of left artificial hip joint: Secondary | ICD-10-CM | POA: Diagnosis not present

## 2022-12-10 DIAGNOSIS — I951 Orthostatic hypotension: Secondary | ICD-10-CM | POA: Insufficient documentation

## 2022-12-10 DIAGNOSIS — R531 Weakness: Secondary | ICD-10-CM | POA: Diagnosis not present

## 2022-12-10 DIAGNOSIS — F0280 Dementia in other diseases classified elsewhere without behavioral disturbance: Secondary | ICD-10-CM | POA: Diagnosis not present

## 2022-12-10 DIAGNOSIS — S41112A Laceration without foreign body of left upper arm, initial encounter: Secondary | ICD-10-CM | POA: Insufficient documentation

## 2022-12-10 DIAGNOSIS — G309 Alzheimer's disease, unspecified: Secondary | ICD-10-CM | POA: Insufficient documentation

## 2022-12-10 DIAGNOSIS — S065XAA Traumatic subdural hemorrhage with loss of consciousness status unknown, initial encounter: Secondary | ICD-10-CM | POA: Diagnosis not present

## 2022-12-10 DIAGNOSIS — E039 Hypothyroidism, unspecified: Secondary | ICD-10-CM | POA: Insufficient documentation

## 2022-12-10 DIAGNOSIS — I10 Essential (primary) hypertension: Secondary | ICD-10-CM | POA: Insufficient documentation

## 2022-12-10 DIAGNOSIS — Z79899 Other long term (current) drug therapy: Secondary | ICD-10-CM | POA: Diagnosis not present

## 2022-12-10 DIAGNOSIS — W19XXXA Unspecified fall, initial encounter: Secondary | ICD-10-CM | POA: Insufficient documentation

## 2022-12-10 DIAGNOSIS — G9340 Encephalopathy, unspecified: Secondary | ICD-10-CM

## 2022-12-10 DIAGNOSIS — F32A Depression, unspecified: Secondary | ICD-10-CM | POA: Diagnosis present

## 2022-12-10 DIAGNOSIS — I6200 Nontraumatic subdural hemorrhage, unspecified: Principal | ICD-10-CM | POA: Insufficient documentation

## 2022-12-10 DIAGNOSIS — R262 Difficulty in walking, not elsewhere classified: Secondary | ICD-10-CM

## 2022-12-10 LAB — COMPREHENSIVE METABOLIC PANEL
ALT: 35 U/L (ref 0–44)
AST: 36 U/L (ref 15–41)
Albumin: 3.6 g/dL (ref 3.5–5.0)
Alkaline Phosphatase: 46 U/L (ref 38–126)
Anion gap: 11 (ref 5–15)
BUN: 22 mg/dL (ref 8–23)
CO2: 28 mmol/L (ref 22–32)
Calcium: 9.6 mg/dL (ref 8.9–10.3)
Chloride: 99 mmol/L (ref 98–111)
Creatinine, Ser: 1.49 mg/dL — ABNORMAL HIGH (ref 0.44–1.00)
GFR, Estimated: 33 mL/min — ABNORMAL LOW (ref >60)
Glucose, Bld: 109 mg/dL — ABNORMAL HIGH (ref 70–99)
Potassium: 4.2 mmol/L (ref 3.5–5.1)
Sodium: 138 mmol/L (ref 135–145)
Total Bilirubin: 0.5 mg/dL (ref 0.3–1.2)
Total Protein: 7 g/dL (ref 6.5–8.1)

## 2022-12-10 LAB — CBC WITH DIFFERENTIAL/PLATELET
Abs Immature Granulocytes: 0.04 10*3/uL (ref 0.00–0.07)
Basophils Absolute: 0 10*3/uL (ref 0.0–0.1)
Basophils Relative: 0 %
Eosinophils Absolute: 0.1 10*3/uL (ref 0.0–0.5)
Eosinophils Relative: 1 %
HCT: 37.5 % (ref 36.0–46.0)
Hemoglobin: 12.1 g/dL (ref 12.0–15.0)
Immature Granulocytes: 0 %
Lymphocytes Relative: 29 %
Lymphs Abs: 2.7 10*3/uL (ref 0.7–4.0)
MCH: 32.8 pg (ref 26.0–34.0)
MCHC: 32.3 g/dL (ref 30.0–36.0)
MCV: 101.6 fL — ABNORMAL HIGH (ref 80.0–100.0)
Monocytes Absolute: 0.9 10*3/uL (ref 0.1–1.0)
Monocytes Relative: 10 %
Neutro Abs: 5.6 10*3/uL (ref 1.7–7.7)
Neutrophils Relative %: 60 %
Platelets: 227 10*3/uL (ref 150–400)
RBC: 3.69 MIL/uL — ABNORMAL LOW (ref 3.87–5.11)
RDW: 12.7 % (ref 11.5–15.5)
WBC: 9.4 10*3/uL (ref 4.0–10.5)
nRBC: 0 % (ref 0.0–0.2)

## 2022-12-10 LAB — TROPONIN I (HIGH SENSITIVITY)
Troponin I (High Sensitivity): 7 ng/L (ref <18)
Troponin I (High Sensitivity): 7 ng/L (ref <18)

## 2022-12-10 MED ORDER — ACETAMINOPHEN 650 MG RE SUPP: 650.0000 mg | Freq: Four times a day (QID) | RECTAL | Status: DC | PRN

## 2022-12-10 MED ORDER — ONDANSETRON HCL 4 MG/2ML IJ SOLN
4.0000 mg | Freq: Once | INTRAMUSCULAR | Status: DC
  Filled 2022-12-10: qty 2

## 2022-12-10 MED ORDER — ACETAMINOPHEN 325 MG PO TABS: 650.0000 mg | ORAL_TABLET | Freq: Four times a day (QID) | ORAL | Status: DC | PRN

## 2022-12-10 NOTE — H&P (Signed)
History and Physical    Michelle Jones VWU:[id number] DOB: [birth year] DOA: 12/10/2022  PCP: System, Provider Not In  Patient coming from: ALF  Chief Complaint: Altered mental status  HPI: Michelle Jones is a 89 y.o. female with medical history significant of Alzheimer's dementia, hypertension, hyperlipidemia, GERD, hypothyroidism, anxiety/depression.  Recently seen in the ED on 4/23 following a fall and CT head/C-spine were negative for acute traumatic injuries at that time.  She had a left forearm laceration which was repaired.  UA was concerning for UTI and she was prescribed Keflex and discharged.  Urine culture came back showing multiple species.  She returned to the ED yesterday after another unwitnessed fall.  CT head was showing a small acute left convexity subdural hematoma measuring up to 5 mm in thickness with no significant mass effect or midline shift.  Also showing a small left frontal scalp hematoma but no calvarial fracture.  CT C-spine was negative for acute traumatic injuries.  Head CT findings were discussed with neurosurgery who recommended repeat CT head in 6 hours to ensure stability.  She did have a repeat CT done which was showing stable findings.   Patient is not on anticoagulation or antiplatelet agents.  It was noted that she was having difficulty ambulating yesterday but ED physician had a discussion with her family and she was discharged back to her assisted living facility.  Patient returns to the ED today from her assisted living facility due to concern for altered mental status and difficulty walking.  She did not have another fall.  Staff at her facility told family that patient appeared more lethargic and confused than her baseline.  Patient is normally able to ambulate with a walker but today family had to help her get into a wheelchair to get her to the ED.  On repeat CT head today, the left convexity subdural hematoma is only minimally visible and no new site of  hemorrhage.  Vital signs stable.  CBC showing no leukocytosis, hemoglobin stable, and platelet count normal.  No significant abnormalities on CMP, not hyponatremic or hypoglycemic, creatinine 1.4 (baseline around 1.2), LFTs normal.  Troponin negative x 2.  History provided by the patient and her son at bedside.  Patient is not sure how she fell.  She has no complaints.  Denies headaches, dizziness fevers, cough, shortness of breath, chest pain, nausea, vomiting, abdominal pain, or diarrhea.  She denies any change in her appetite.  Per son, patient had COVID infection a couple of weeks ago and has been weak since then and has had multiple falls at her facility.  In addition, she was diagnosed with a UTI during recent ED visit on 4/23 which he thinks is also contributing to her generalized weakness.  Patient is reporting improvement of her urinary symptoms.  Today patient was so weak that family had a difficult time getting her from the car to the wheelchair to come into the emergency room.  She does not take any blood thinners.  Review of Systems:  Review of Systems  All other systems reviewed and are negative.   Past Medical History:  Diagnosis Date   GERD (gastroesophageal reflux disease)    Hypertension     Past Surgical History:  Procedure Laterality Date   APPENDECTOMY     carpel tunnel release  2002   Dr. Molly Maduro Sypher   ERCP N/A 09/20/2018   Procedure: ENDOSCOPIC RETROGRADE CHOLANGIOPANCREATOGRAPHY (ERCP);  Surgeon: Kerin Salen, MD;  Location: [facility];  Service: Gastroenterology;  Laterality: N/A;   PANCREATIC STENT PLACEMENT  09/20/2018   Procedure: PANCREATIC STENT PLACEMENT;  Surgeon: Kerin Salen, MD;  Location: [facility];  Service: Gastroenterology;;   REMOVAL OF STONES  09/20/2018   Procedure: REMOVAL OF STONES;  Surgeon: Kerin Salen, MD;  Location: [facility];  Service: Gastroenterology;;   Dennison Mascot  09/20/2018   Procedure: Dennison Mascot;  Surgeon: Kerin Salen, MD;   Location: [facility];  Service: Gastroenterology;;   TONSILLECTOMY  1940s   TOTAL HIP ARTHROPLASTY Left 01/14/2022   Procedure: HEMI HIP ARTHROPLASTY;  Surgeon: Durene Romans, MD;  Location: [facility] OR;  Service: Orthopedics;  Laterality: Left;   VAGINAL HYSTERECTOMY     ovaries left in place??? patient on premarin postop despite premenopausal age     reports that she has never smoked. She has never used smokeless tobacco. She reports that she does not drink alcohol and does not use drugs.  Allergies  Allergen Reactions   Nsaids Other (See Comments)    Unknown reaction - listed on [facility] 01/13/22   Penicillins Other (See Comments)    Has patient had a PCN reaction causing immediate rash, facial/tongue/throat swelling, SOB or lightheadedness with hypotension: no throat swelling or SOB Has patient had a PCN reaction causing severe rash involving mucus membranes or skin necrosis: unknown, patients states she had sores on her mouth Has patient had a PCN reaction that required hospitalization: no Has patient had a PCN reaction occurring within the last 10 years: no If all of the above answers are "NO", then may proceed with Cephalosp   Septra [Sulfamethoxazole-Trimethoprim] Other (See Comments)    Unknown reaction - listed on [facility] 01/13/2022    Family History  Problem Relation Age of Onset   Melanoma Mother    Breast cancer Sister    Diabetes Brother     Prior to Admission medications   Medication Sig Start Date End Date Taking? Authorizing Provider  acetaminophen (TYLENOL) 650 MG CR tablet Take 650 mg by mouth 2 (two) times daily.    [provider]  Benzocaine-Menthol (SORE THROAT LOZENGES MT) Use as directed 1 lozenge in the mouth or throat every 4 (four) hours as needed (sore throat).    [provider]  buPROPion (WELLBUTRIN XL) 300 MG 24 hr tablet 300 mg every morning. Take one tablet by mouth once daily for depression 10/03/14   [provider]  cephALEXin (KEFLEX)  500 MG capsule Take 1 capsule (500 mg total) by mouth 4 (four) times daily. 12/03/22   Wynetta Fines, MD  Coenzyme Q10 (COQ10) 200 MG CAPS Take 200 mg by mouth every morning.    [provider]  diclofenac Sodium (VOLTAREN) 1 % GEL Apply 4 g topically 4 (four) times daily. Patient taking differently: Apply 2 g topically every 8 (eight) hours as needed (painful joints). 09/28/21   Melene Plan, DO  docusate sodium (COLACE) 100 MG capsule Take 1 capsule (100 mg total) by mouth 2 (two) times daily. 01/16/22   [place], Alvira Philips, DO  DULoxetine (CYMBALTA) 60 MG capsule 60 mg every morning. 10/03/14   [provider]  famotidine (PEPCID) 40 MG tablet Take 40 mg by mouth every morning.    [provider]  ferrous sulfate 325 (65 FE) MG tablet Take 325 mg by mouth every morning.    [provider]  furosemide (LASIX) 20 MG tablet Take 20 mg by mouth every morning.    [provider]  guaiFENesin (MUCINEX) 600 MG 12 hr tablet Take  600 mg by mouth 2 (two) times daily.    [provider]  levothyroxine (SYNTHROID, LEVOTHROID) 100 MCG tablet Take 100 mcg by mouth every morning. 10/22/14   [provider]  Loperamide HCl 1 MG/7.5ML LIQD Take 2 mg by mouth every 6 (six) hours as needed (diarrhea).    [provider]  memantine (NAMENDA) 5 MG tablet . Take 1 tablet (5 mg at night) for 2 weeks, then increase to 1 tablet (5 mg) twice a day Patient taking differently: Take 5 mg by mouth See admin instructions. Take one tablet (5 mg) by mouth daily at bedtime for 2 weeks, then take 1 tablet (5 mg) twice daily 12/26/21   Gwynneth Munson, Sung Amabile, PA-C  methocarbamol (ROBAXIN) 500 MG tablet Take 1 tablet (500 mg total) by mouth every 8 (eight) hours as needed for muscle spasms. 01/15/22   Cassandria Anger, PA-C  metoprolol succinate (TOPROL-XL) 100 MG 24 hr tablet Take 100 mg by mouth every morning. 12/31/21   [provider]  Multiple Vitamin (MULTIVITAMIN  WITH MINERALS) TABS tablet Take 1 tablet by mouth every morning. Centrum Silver Women    [provider]  oxybutynin (DITROPAN) 5 MG tablet Take 5 mg by mouth 2 (two) times daily.    [provider]  raloxifene (EVISTA) 60 MG tablet Take 60 mg by mouth every morning. 12/31/21   [provider]  rosuvastatin (CRESTOR) 10 MG tablet Take 10 mg by mouth every Friday.    [provider]  senna-docusate (SENOKOT-S) 8.6-50 MG tablet Take 1 tablet by mouth daily as needed (constipation).    [provider]  SIMETHICONE PO Take 2 tablets by mouth every 12 (twelve) hours as needed (heartburn/indigestion). Chewable - unknown strength    [provider]    Physical Exam: Vitals:   12/10/22 1741  BP: (!) 140/79  Pulse: 80  Resp: 16  Temp: 98 F (36.7 C)  TempSrc: Oral  SpO2: 96%    Physical Exam Vitals reviewed.  Constitutional:      General: She is not in acute distress. HENT:     Head: Normocephalic.     Comments: Left forehead hematoma, slight left periorbital bruising Eyes:     Extraocular Movements: Extraocular movements intact.  Cardiovascular:     Rate and Rhythm: Normal rate and regular rhythm.     Pulses: Normal pulses.  Pulmonary:     Effort: Pulmonary effort is normal. No respiratory distress.     Breath sounds: Rales present. No wheezing.  Abdominal:     General: Bowel sounds are normal. There is no distension.     Palpations: Abdomen is soft.     Tenderness: There is no abdominal tenderness.  Musculoskeletal:     Cervical back: Normal range of motion.     Right lower leg: No edema.     Left lower leg: No edema.  Skin:    General: Skin is warm and dry.  Neurological:     General: No focal deficit present.     Mental Status: She is alert and oriented to person, place, and time.     Cranial Nerves: No cranial nerve deficit.     Sensory: No sensory deficit.     Motor: No weakness.     Labs on Admission: I have  personally reviewed following labs and imaging studies  CBC: Recent Labs  Lab 12/09/22 2221 12/10/22 1810  WBC 9.3 9.4  NEUTROABS 6.6 5.6  HGB 12.5 12.1  HCT  38.1 37.5  MCV 101.6* 101.6*  PLT 203 227   Basic Metabolic Panel: Recent Labs  Lab 12/09/22 2221 12/10/22 1810  NA 136 138  K 4.2 4.2  CL 99 99  CO2 25 28  GLUCOSE 135* 109*  BUN 24* 22  CREATININE 1.29* 1.49*  CALCIUM 9.2 9.6   GFR: CrCl cannot be calculated (Unknown ideal weight.). Liver Function Tests: Recent Labs  Lab 12/09/22 2221 12/10/22 1810  AST 39 36  ALT 35 35  ALKPHOS 45 46  BILITOT 0.6 0.5  PROT 6.5 7.0  ALBUMIN 3.4* 3.6   No results for input(s): "LIPASE", "AMYLASE" in the last 168 hours. No results for input(s): "AMMONIA" in the last 168 hours. Coagulation Profile: No results for input(s): "INR", "PROTIME" in the last 168 hours. Cardiac Enzymes: No results for input(s): "CKTOTAL", "CKMB", "CKMBINDEX", "TROPONINI" in the last 168 hours. BNP (last 3 results) No results for input(s): "PROBNP" in the last 8760 hours. HbA1C: No results for input(s): "HGBA1C" in the last 72 hours. CBG: No results for input(s): "GLUCAP" in the last 168 hours. Lipid Profile: No results for input(s): "CHOL", "HDL", "LDLCALC", "TRIG", "CHOLHDL", "LDLDIRECT" in the last 72 hours. Thyroid Function Tests: No results for input(s): "TSH", "T4TOTAL", "FREET4", "T3FREE", "THYROIDAB" in the last 72 hours. Anemia Panel: No results for input(s): "VITAMINB12", "FOLATE", "FERRITIN", "TIBC", "IRON", "RETICCTPCT" in the last 72 hours. Urine analysis:    Component Value Date/Time   COLORURINE YELLOW 12/03/2022 0840   APPEARANCEUR CLOUDY (A) 12/03/2022 0840   LABSPEC 1.025 12/03/2022 0840   PHURINE 6.0 12/03/2022 0840   GLUCOSEU NEGATIVE 12/03/2022 0840   HGBUR SMALL (A) 12/03/2022 0840   BILIRUBINUR NEGATIVE 12/03/2022 0840   KETONESUR NEGATIVE 12/03/2022 0840   PROTEINUR 100 (A) 12/03/2022 0840   UROBILINOGEN 0.2  05/05/2015 1043   NITRITE NEGATIVE 12/03/2022 0840   LEUKOCYTESUR LARGE (A) 12/03/2022 0840    Radiological Exams on Admission: CT HEAD WO CONTRAST ( )  Result Date: 12/10/2022 CLINICAL DATA:  Altered mental status EXAM: CT HEAD WITHOUT CONTRAST TECHNIQUE: Contiguous axial images were obtained from the base of the skull through the vertex without intravenous contrast. RADIATION DOSE REDUCTION: This exam was performed according to the departmental dose-optimization program which includes automated exposure control, adjustment of the mA and/or kV according to patient size and/or use of iterative reconstruction technique. COMPARISON:  Head CT 12/10/2022 FINDINGS: Brain: The left convexity subdural hematoma is only minimally visible today. This is likely due to redistribution. No new site of hemorrhage. Unchanged volume loss. There is periventricular hypoattenuation compatible with chronic microvascular disease. Vascular: No abnormal hyperdensity of the major intracranial arteries or dural venous sinuses. No intracranial atherosclerosis. Skull: Left frontal scalp hematoma Sinuses/Orbits: No fluid levels or advanced mucosal thickening of the visualized paranasal sinuses. No mastoid or middle ear effusion. The orbits are normal. IMPRESSION: 1. The left convexity subdural hematoma is only minimally visible today. This is likely due to redistribution. No new site of hemorrhage. 2. Left frontal scalp hematoma. Electronically Signed   By: Deatra Robinson M.D.   On: 12/10/2022 19:38   CT HEAD WO CONTRAST ( )  Result Date: 12/10/2022 CLINICAL DATA:  Head trauma EXAM: CT HEAD WITHOUT CONTRAST TECHNIQUE: Contiguous axial images were obtained from the base of the skull through the vertex without intravenous contrast. RADIATION DOSE REDUCTION: This exam was performed according to the departmental dose-optimization program which includes automated exposure control, adjustment of the mA and/or kV according to patient  size and/or use of iterative  reconstruction technique. COMPARISON:  12/09/2022 FINDINGS: Brain: Unchanged size of 5 mm left convexity subdural hematoma. No new site of hemorrhage. No midline shift or other mass effect. There is periventricular hypoattenuation compatible with chronic microvascular disease. Mild volume loss. Vascular: Atherosclerotic calcification of the major vessels at the skull base. Skull: No fracture.  Left frontal scalp hematoma. Sinuses/Orbits: No acute finding. Other: None. IMPRESSION: 1. Unchanged size of 5 mm left convexity subdural hematoma. No new site of hemorrhage. 2. Left frontal scalp hematoma without skull fracture. Electronically Signed   By: Deatra Robinson M.D.   On: 12/10/2022 03:46   CT HEAD WO CONTRAST ( )  Result Date: 12/09/2022 CLINICAL DATA:  Head trauma, intracranial arterial injury suspected; Ataxia, cervical trauma. EXAM: CT HEAD WITHOUT CONTRAST CT CERVICAL SPINE WITHOUT CONTRAST TECHNIQUE: Multidetector CT imaging of the head and cervical spine was performed following the standard protocol without intravenous contrast. Multiplanar CT image reconstructions of the cervical spine were also generated. RADIATION DOSE REDUCTION: This exam was performed according to the departmental dose-optimization program which includes automated exposure control, adjustment of the mA and/or kV according to patient size and/or use of iterative reconstruction technique. COMPARISON:  CT head and cervical spine 12/03/2022. FINDINGS: CT HEAD FINDINGS Brain: Small, acute left convexity subdural hematoma measuring up to 5 mm in thickness (coronal image 39 series 5). No significant mass effect or midline shift. Cortical gray-white differentiation is preserved. No hydrocephalus. Vascular: No hyperdense vessel or unexpected calcification. Skull: No calvarial fracture or suspicious bone lesion. Skull base is unremarkable. Sinuses/Orbits: Unremarkable. Other: Small left frontal scalp hematoma. CT  CERVICAL SPINE FINDINGS Alignment: Unchanged 3 mm degenerative anterolisthesis of C3 on C4 and C4 on C5. No traumatic malalignment. Skull base and vertebrae: No acute fracture. Normal craniocervical junction. No suspicious bone lesions. Soft tissues and spinal canal: No prevertebral fluid or swelling. No visible canal hematoma. Disc levels: Multilevel cervical spondylosis, worst at C6-7, where there is at least mild spinal canal stenosis. Upper chest: Unremarkable. Other: Atherosclerotic calcifications of the carotid bulbs. IMPRESSION: 1. Small, acute left convexity subdural hematoma measuring up to 5 mm in thickness. No significant mass effect or midline shift. 2. Small left frontal scalp hematoma. No calvarial fracture. 3. No acute fracture or traumatic malalignment of the cervical spine. Critical Value/emergent results were called by telephone at the time of interpretation on 12/09/2022 at 9:25 pm to provider DAVID YAO , who verbally acknowledged these results. Electronically Signed   By: Orvan Falconer M.D.   On: 12/09/2022 21:31   CT Cervical Spine Wo Contrast  Result Date: 12/09/2022 CLINICAL DATA:  Head trauma, intracranial arterial injury suspected; Ataxia, cervical trauma. EXAM: CT HEAD WITHOUT CONTRAST CT CERVICAL SPINE WITHOUT CONTRAST TECHNIQUE: Multidetector CT imaging of the head and cervical spine was performed following the standard protocol without intravenous contrast. Multiplanar CT image reconstructions of the cervical spine were also generated. RADIATION DOSE REDUCTION: This exam was performed according to the departmental dose-optimization program which includes automated exposure control, adjustment of the mA and/or kV according to patient size and/or use of iterative reconstruction technique. COMPARISON:  CT head and cervical spine 12/03/2022. FINDINGS: CT HEAD FINDINGS Brain: Small, acute left convexity subdural hematoma measuring up to 5 mm in thickness (coronal image 39 series 5). No  significant mass effect or midline shift. Cortical gray-white differentiation is preserved. No hydrocephalus. Vascular: No hyperdense vessel or unexpected calcification. Skull: No calvarial fracture or suspicious bone lesion. Skull base is unremarkable. Sinuses/Orbits: Unremarkable. Other: Small left frontal  scalp hematoma. CT CERVICAL SPINE FINDINGS Alignment: Unchanged 3 mm degenerative anterolisthesis of C3 on C4 and C4 on C5. No traumatic malalignment. Skull base and vertebrae: No acute fracture. Normal craniocervical junction. No suspicious bone lesions. Soft tissues and spinal canal: No prevertebral fluid or swelling. No visible canal hematoma. Disc levels: Multilevel cervical spondylosis, worst at C6-7, where there is at least mild spinal canal stenosis. Upper chest: Unremarkable. Other: Atherosclerotic calcifications of the carotid bulbs. IMPRESSION: 1. Small, acute left convexity subdural hematoma measuring up to 5 mm in thickness. No significant mass effect or midline shift. 2. Small left frontal scalp hematoma. No calvarial fracture. 3. No acute fracture or traumatic malalignment of the cervical spine. Critical Value/emergent results were called by telephone at the time of interpretation on 12/09/2022 at 9:25 pm to provider DAVID YAO , who verbally acknowledged these results. Electronically Signed   By: Orvan Falconer M.D.   On: 12/09/2022 21:31   DG Pelvis Portable  Result Date: 12/09/2022 CLINICAL DATA:  Recent fall with pelvic pain, initial encounter EXAM: PORTABLE PELVIS 1 VIEWS COMPARISON:  01/14/2022 FINDINGS: Left hip prosthesis is again noted. Postsurgical changes are noted in the pelvis. Pelvic ring is intact. No acute fracture or dislocation is noted. No soft tissue changes are seen. IMPRESSION: Acute abnormality noted. Electronically Signed   By: Alcide Clever M.D.   On: 12/09/2022 21:12   DG Chest Port 1 View  Result Date: 12/09/2022 CLINICAL DATA:  Recent fall EXAM: PORTABLE CHEST 1  VIEW COMPARISON:  01/13/2022 FINDINGS: Cardiac shadow is within normal limits. Lungs are hypoinflated bilaterally. No focal infiltrate or sizable effusion is seen. No acute bony abnormality is noted. IMPRESSION: No acute abnormality seen. Electronically Signed   By: Alcide Clever M.D.   On: 12/09/2022 21:11    EKG: Independently reviewed.  Sinus rhythm, T wave abnormality in inferior leads.  Assessment and Plan  Small subdural hematoma Secondary to recent fall and head injury.  She is presenting from her assisted living facility with concern for confusion and difficulty ambulating.  No focal neurodeficit on exam.  On repeat CT head today, the left convexity subdural hematoma is only minimally visible and no new site of hemorrhage.  Patient is not on anticoagulation or antiplatelet agents.  Patient does have dementia and does not remember how she fell.  Otherwise, she is answering questions appropriately and does not appear disoriented.  She was recently treated for UTI, will order repeat UA.  Continue neurochecks every 4 hours.  Generalized weakness/falls Generalized weakness in the setting of COVID infection a few weeks ago and recent UTI.  It seems she has had multiple falls at her assisted living facility and some of these falls were unwitnessed. ?Syncope.  Will place on cardiac monitoring and order echocardiogram.  Check orthostatics.  PT/OT eval, fall precautions.  Rales appreciated on exam No hypoxia or respiratory distress.  Lungs clear on chest x-ray.  Hypertension Hold home diuretic/antihypertensives at this time.  Check orthostatics given multiple recent falls.  Alzheimer's dementia: Follow delirium precautions. Hyperlipidemia GERD Hypothyroidism: Check TSH. Anxiety/depression Pharmacy med rec pending.  DVT prophylaxis: SCDs Code Status: DNR/DNI (discussed with the patient and her son) Family Communication: Son at bedside. Level of care: Telemetry bed Admission status: It is my  clinical opinion that referral for OBSERVATION is reasonable and necessary in this patient based on the above information provided. The aforementioned taken together are felt to place the patient at high risk for further clinical deterioration. However, it  is anticipated that the patient may be medically stable for discharge from the hospital within 24 to 48 hours.   John Giovanni MD [facility]  If 7PM-7AM, please contact night-coverage [email or web address]  12/10/2022, 8:24 PM

## 2022-12-10 NOTE — ED Notes (Signed)
Daughter asked to wait to discharge home by [facility] until after son arrives and attempts to assist patient.

## 2022-12-10 NOTE — ED Triage Notes (Signed)
Patient has been resent for re-evaluation from [facility]. Seen yesterday following fall and diagnosed with small subdural. Staff report increased weakness and not as active today. Patient alert and denies pain. Bruising noted to left forehead from fall yesterday. No new trauma. Denies pain, no nausea, no vomiting

## 2022-12-10 NOTE — ED Provider Notes (Signed)
[facility] AT [facility] Provider Note   CSN: [id number] Arrival date & time: 12/10/22  1725     History  No chief complaint on file.   Michelle Jones is a 89 y.o. female who presents emergency department chief complaint of altered mental status and inability to ambulate.  History is given by patient's daughter at bedside and review of EMR.  Patient lives in an assisted living facility.  She according to her daughter was in the cafeteria and had an unwitnessed fall.  Unsure of reason of her fall.  Patient was diagnosed last night with subdural hematoma and laceration of the forehead.  She was held and then reevaluated with a second CT which showed no progression her subdural.  Patient's daughter states that the staff called out to let her know that patient seemed to be more lethargic and confused than her baseline.  Her daughter states that she still has not been able to walk since she was discharged.  She lives in assisted living and normally ambulates with a walker however she states that it "took everything my brother and I had in [place]" to get her into a wheelchair and get her to the ER today  HPI     Home Medications Prior to Admission medications   Medication Sig Start Date End Date Taking? Authorizing Provider  acetaminophen (TYLENOL) 650 MG CR tablet Take 650 mg by mouth 2 (two) times daily.    [provider]  Benzocaine-Menthol (SORE THROAT LOZENGES MT) Use as directed 1 lozenge in the mouth or throat every 4 (four) hours as needed (sore throat).    [provider]  buPROPion (WELLBUTRIN XL) 300 MG 24 hr tablet 300 mg every morning. Take one tablet by mouth once daily for depression 10/03/14   [provider]  cephALEXin (KEFLEX) 500 MG capsule Take 1 capsule (500 mg total) by mouth 4 (four) times daily. 12/03/22   Wynetta Fines, MD  Coenzyme Q10 (COQ10) 200 MG CAPS Take 200 mg by mouth every morning.    [provider]  diclofenac Sodium (VOLTAREN) 1 % GEL Apply 4 g topically 4 (four) times daily. Patient taking differently: Apply 2 g topically every 8 (eight) hours as needed (painful joints). 09/28/21   Melene Plan, DO  docusate sodium (COLACE) 100 MG capsule Take 1 capsule (100 mg total) by mouth 2 (two) times daily. 01/16/22   [place], Alvira Philips, DO  DULoxetine (CYMBALTA) 60 MG capsule 60 mg every morning. 10/03/14   [provider]  famotidine (PEPCID) 40 MG tablet Take 40 mg by mouth every morning.    [provider]  ferrous sulfate 325 (65 FE) MG tablet Take 325 mg by mouth every morning.    [provider]  furosemide (LASIX) 20 MG tablet Take 20 mg by mouth every morning.    [provider]  guaiFENesin (MUCINEX) 600 MG 12 hr tablet Take 600 mg by mouth 2 (two) times daily.    [provider]  levothyroxine (SYNTHROID, LEVOTHROID) 100 MCG tablet Take 100 mcg by mouth every morning. 10/22/14   [provider]  Loperamide HCl 1 MG/7.5ML LIQD Take 2 mg by mouth every 6 (six) hours as needed (diarrhea).    [provider]  memantine (NAMENDA) 5 MG tablet . Take 1 tablet (5 mg at night) for 2 weeks, then increase to 1 tablet (5 mg) twice a day Patient taking differently: Take 5 mg by mouth See  admin instructions. Take one tablet (5 mg) by mouth daily at bedtime for 2 weeks, then take 1 tablet (5 mg) twice daily 12/26/21   Gwynneth Munson, Sung Amabile, PA-C  methocarbamol (ROBAXIN) 500 MG tablet Take 1 tablet (500 mg total) by mouth every 8 (eight) hours as needed for muscle spasms. 01/15/22   Cassandria Anger, PA-C  metoprolol succinate (TOPROL-XL) 100 MG 24 hr tablet Take 100 mg by mouth every morning. 12/31/21   [provider]  Multiple Vitamin (MULTIVITAMIN WITH MINERALS) TABS tablet Take 1 tablet by mouth every morning. Centrum Silver Women    [provider]  oxybutynin (DITROPAN) 5 MG tablet Take 5 mg by mouth 2 (two) times daily.     [provider]  raloxifene (EVISTA) 60 MG tablet Take 60 mg by mouth every morning. 12/31/21   [provider]  rosuvastatin (CRESTOR) 10 MG tablet Take 10 mg by mouth every Friday.    [provider]  senna-docusate (SENOKOT-S) 8.6-50 MG tablet Take 1 tablet by mouth daily as needed (constipation).    [provider]  SIMETHICONE PO Take 2 tablets by mouth every 12 (twelve) hours as needed (heartburn/indigestion). Chewable - unknown strength    [provider]      Allergies    Nsaids, Penicillins, and Septra [sulfamethoxazole-trimethoprim]    Review of Systems   Review of Systems  Physical Exam Updated Vital Signs BP (!) 140/79   Pulse 80   Temp 98 F (36.7 C) (Oral)   Resp 16   SpO2 96%  Physical Exam Vitals and nursing note reviewed.  Constitutional:      General: She is not in acute distress.    Appearance: She is well-developed. She is not diaphoretic.  HENT:     Head: Normocephalic.     Comments: Into the left side of the head with hematoma appears well-healing    Right Ear: External ear normal.     Left Ear: External ear normal.     Nose: Nose normal.     Mouth/Throat:     Mouth: Mucous membranes are moist.  Eyes:     General: No scleral icterus.    Extraocular Movements: Extraocular movements intact.     Conjunctiva/sclera: Conjunctivae normal.     Pupils: Pupils are equal, round, and reactive to light.  Cardiovascular:     Rate and Rhythm: Normal rate and regular rhythm.     Heart sounds: Normal heart sounds. No murmur heard.    No friction rub. No gallop.  Pulmonary:     Effort: Pulmonary effort is normal. No respiratory distress.     Breath sounds: Normal breath sounds.  Abdominal:     General: Bowel sounds are normal. There is no distension.     Palpations: Abdomen is soft. There is no mass.     Tenderness: There is no abdominal tenderness. There is no guarding.  Musculoskeletal:     Cervical back: Normal  range of motion.  Skin:    General: Skin is warm and dry.  Neurological:     Mental Status: She is alert and oriented to person, place, and time.     Comments: Follows all commands.  Equal bilateral grip strength, normal strength with plantar and dorsi flexion, able to lift her legs off of the bed.  Psychiatric:        Behavior: Behavior normal.     ED Results / Procedures / Treatments   Labs (all labs ordered are listed, but only  abnormal results are displayed) Labs Reviewed  CBC WITH DIFFERENTIAL/PLATELET - Abnormal; Notable for the following components:      Result Value   RBC 3.69 (*)    MCV 101.6 (*)    All other components within normal limits  COMPREHENSIVE METABOLIC PANEL  TROPONIN I (HIGH SENSITIVITY)    EKG None  Radiology CT HEAD WO CONTRAST ( )  Result Date: 12/10/2022 CLINICAL DATA:  Head trauma EXAM: CT HEAD WITHOUT CONTRAST TECHNIQUE: Contiguous axial images were obtained from the base of the skull through the vertex without intravenous contrast. RADIATION DOSE REDUCTION: This exam was performed according to the departmental dose-optimization program which includes automated exposure control, adjustment of the mA and/or kV according to patient size and/or use of iterative reconstruction technique. COMPARISON:  12/09/2022 FINDINGS: Brain: Unchanged size of 5 mm left convexity subdural hematoma. No new site of hemorrhage. No midline shift or other mass effect. There is periventricular hypoattenuation compatible with chronic microvascular disease. Mild volume loss. Vascular: Atherosclerotic calcification of the major vessels at the skull base. Skull: No fracture.  Left frontal scalp hematoma. Sinuses/Orbits: No acute finding. Other: None. IMPRESSION: 1. Unchanged size of 5 mm left convexity subdural hematoma. No new site of hemorrhage. 2. Left frontal scalp hematoma without skull fracture. Electronically Signed   By: Deatra Robinson M.D.   On: 12/10/2022 03:46   CT HEAD WO  CONTRAST ( )  Result Date: 12/09/2022 CLINICAL DATA:  Head trauma, intracranial arterial injury suspected; Ataxia, cervical trauma. EXAM: CT HEAD WITHOUT CONTRAST CT CERVICAL SPINE WITHOUT CONTRAST TECHNIQUE: Multidetector CT imaging of the head and cervical spine was performed following the standard protocol without intravenous contrast. Multiplanar CT image reconstructions of the cervical spine were also generated. RADIATION DOSE REDUCTION: This exam was performed according to the departmental dose-optimization program which includes automated exposure control, adjustment of the mA and/or kV according to patient size and/or use of iterative reconstruction technique. COMPARISON:  CT head and cervical spine 12/03/2022. FINDINGS: CT HEAD FINDINGS Brain: Small, acute left convexity subdural hematoma measuring up to 5 mm in thickness (coronal image 39 series 5). No significant mass effect or midline shift. Cortical gray-white differentiation is preserved. No hydrocephalus. Vascular: No hyperdense vessel or unexpected calcification. Skull: No calvarial fracture or suspicious bone lesion. Skull base is unremarkable. Sinuses/Orbits: Unremarkable. Other: Small left frontal scalp hematoma. CT CERVICAL SPINE FINDINGS Alignment: Unchanged 3 mm degenerative anterolisthesis of C3 on C4 and C4 on C5. No traumatic malalignment. Skull base and vertebrae: No acute fracture. Normal craniocervical junction. No suspicious bone lesions. Soft tissues and spinal canal: No prevertebral fluid or swelling. No visible canal hematoma. Disc levels: Multilevel cervical spondylosis, worst at C6-7, where there is at least mild spinal canal stenosis. Upper chest: Unremarkable. Other: Atherosclerotic calcifications of the carotid bulbs. IMPRESSION: 1. Small, acute left convexity subdural hematoma measuring up to 5 mm in thickness. No significant mass effect or midline shift. 2. Small left frontal scalp hematoma. No calvarial fracture. 3. No  acute fracture or traumatic malalignment of the cervical spine. Critical Value/emergent results were called by telephone at the time of interpretation on 12/09/2022 at 9:25 pm to provider DAVID YAO , who verbally acknowledged these results. Electronically Signed   By: Orvan Falconer M.D.   On: 12/09/2022 21:31   CT Cervical Spine Wo Contrast  Result Date: 12/09/2022 CLINICAL DATA:  Head trauma, intracranial arterial injury suspected; Ataxia, cervical trauma. EXAM: CT HEAD WITHOUT CONTRAST CT CERVICAL SPINE WITHOUT CONTRAST TECHNIQUE: Multidetector CT imaging of  the head and cervical spine was performed following the standard protocol without intravenous contrast. Multiplanar CT image reconstructions of the cervical spine were also generated. RADIATION DOSE REDUCTION: This exam was performed according to the departmental dose-optimization program which includes automated exposure control, adjustment of the mA and/or kV according to patient size and/or use of iterative reconstruction technique. COMPARISON:  CT head and cervical spine 12/03/2022. FINDINGS: CT HEAD FINDINGS Brain: Small, acute left convexity subdural hematoma measuring up to 5 mm in thickness (coronal image 39 series 5). No significant mass effect or midline shift. Cortical gray-white differentiation is preserved. No hydrocephalus. Vascular: No hyperdense vessel or unexpected calcification. Skull: No calvarial fracture or suspicious bone lesion. Skull base is unremarkable. Sinuses/Orbits: Unremarkable. Other: Small left frontal scalp hematoma. CT CERVICAL SPINE FINDINGS Alignment: Unchanged 3 mm degenerative anterolisthesis of C3 on C4 and C4 on C5. No traumatic malalignment. Skull base and vertebrae: No acute fracture. Normal craniocervical junction. No suspicious bone lesions. Soft tissues and spinal canal: No prevertebral fluid or swelling. No visible canal hematoma. Disc levels: Multilevel cervical spondylosis, worst at C6-7, where there is at  least mild spinal canal stenosis. Upper chest: Unremarkable. Other: Atherosclerotic calcifications of the carotid bulbs. IMPRESSION: 1. Small, acute left convexity subdural hematoma measuring up to 5 mm in thickness. No significant mass effect or midline shift. 2. Small left frontal scalp hematoma. No calvarial fracture. 3. No acute fracture or traumatic malalignment of the cervical spine. Critical Value/emergent results were called by telephone at the time of interpretation on 12/09/2022 at 9:25 pm to provider DAVID YAO , who verbally acknowledged these results. Electronically Signed   By: Orvan Falconer M.D.   On: 12/09/2022 21:31   DG Pelvis Portable  Result Date: 12/09/2022 CLINICAL DATA:  Recent fall with pelvic pain, initial encounter EXAM: PORTABLE PELVIS 1 VIEWS COMPARISON:  01/14/2022 FINDINGS: Left hip prosthesis is again noted. Postsurgical changes are noted in the pelvis. Pelvic ring is intact. No acute fracture or dislocation is noted. No soft tissue changes are seen. IMPRESSION: Acute abnormality noted. Electronically Signed   By: Alcide Clever M.D.   On: 12/09/2022 21:12   DG Chest Port 1 View  Result Date: 12/09/2022 CLINICAL DATA:  Recent fall EXAM: PORTABLE CHEST 1 VIEW COMPARISON:  01/13/2022 FINDINGS: Cardiac shadow is within normal limits. Lungs are hypoinflated bilaterally. No focal infiltrate or sizable effusion is seen. No acute bony abnormality is noted. IMPRESSION: No acute abnormality seen. Electronically Signed   By: Alcide Clever M.D.   On: 12/09/2022 21:11    Procedures Procedures    Medications Ordered in ED Medications - No data to display  ED Course/ Medical Decision Making/ A&P Clinical Course as of 12/10/22 1900  Tue Dec 10, 2022  1812 Stable  34 YOF with a chief complaint of fall at ALF Has a SDH  Obsed with normal stability scan  [CC]    Clinical Course User Index [CC] Glyn Ade, MD                             Medical Decision Making Patient  here with concinued inability to ambulate after being seen yesterday for fall/ subdural hematoma. I personally visualized and interpreted the images using our PACS system. Acute findings include:  Improvement in hematoma  Labs reassuring.  Suspect concussion   Needs admission , pt/ot and snf placement   Risk Decision regarding hospitalization.  Final Clinical Impression(s) / ED Diagnoses Final diagnoses:  None    Rx / DC Orders ED Discharge Orders     None         Arthor Captain, PA-C 12/11/22 1610    Glyn Ade, MD 12/13/22 1553

## 2022-12-10 NOTE — ED Notes (Signed)
Shift report received assumed care of patient at this time.

## 2022-12-10 NOTE — ED Notes (Signed)
Patient son arrived, patient ambulated with assistance to wheelchair.

## 2022-12-10 NOTE — ED Notes (Signed)
Patient returned from CT

## 2022-12-10 NOTE — ED Notes (Signed)
Patient transported to CT 

## 2022-12-10 NOTE — ED Provider Triage Note (Signed)
Emergency Medicine Provider Triage Evaluation Note  PERLIE STENE , a 89 y.o. female  was evaluated in triage.  Pt complains of altered mental status.  Patient was seen here yesterday and was diagnosed with subdural hemorrhage that was stable upon repeat CT scan.  Patient was sent back to the assisted living.  Patient was noted to be more altered than usual.  Patient did not have another fall.  Review of Systems  Positive: Confusion and weakness Negative: fall  Physical Exam  There were no vitals taken for this visit. Gen:   Awake, no distress, slightly confused, L scalp hematoma   Resp:  Normal effort  MSK:   Moves extremities without difficulty  Other:    Medical Decision Making  Medically screening exam initiated at 5:38 PM.  Appropriate orders placed.  KRISLYNN GRONAU was informed that the remainder of the evaluation will be completed by another provider, this initial triage assessment does not replace that evaluation, and the importance of remaining in the ED until their evaluation is complete.  HARJIT DOUDS is a 89 y.o. female here presenting with altered mental status.  Patient did not have another fall but had a subdural hemorrhage yesterday.  Will repeat a CT head and labs.    Charlynne Pander, MD 12/10/22 [phone]

## 2022-12-10 NOTE — Discharge Instructions (Addendum)
Please avoid all aspirin or ibuprofen products. You have a tiny area of bleeding in your brain that has not changed If there is any worsening headache, vomiting or confusion in the next 2 days please return to the ER  Patient will be need to be upgraded to higher level of care for the next several days due to her difficulty walking

## 2022-12-10 NOTE — ED Provider Notes (Signed)
No new signs of acute traumatic injury, patient did have difficulty walking She currently lives in an assisted living, but could be upgraded for several days per daughter.  After discussion with patient and daughter, they are comfortable with discharge back if she can be upgraded to higher level of care.   Zadie Rhine, MD 12/10/22 [phone]

## 2022-12-10 NOTE — ED Provider Notes (Signed)
I assumed care at signout.  Repeat CT head is unchanged.  Patient has a very small subdural hematoma.  She is not on anticoagulation.  Patient is awake alert, no acute distress. She is able to move all extremities.  Discussed with patient and her daughter.  Per plan, she will be discharged back to her facility   Zadie Rhine, MD 12/10/22 [phone]

## 2022-12-10 NOTE — ED Notes (Signed)
ED TO INPATIENT HANDOFF REPORT  ED Nurse Name and Phone #: Pennie Rushing A. Toshia Larkin, RN [phone]  S Name/Age/Gender Michelle Jones 89 y.o. female Room/Bed: 046C/046C  Code Status   Code Status: DNR  Home/SNF/Other Skilled nursing facility Patient oriented to: self, place, and situation Is this baseline? Yes   Triage Complete: Triage complete  Chief Complaint Subdural hematoma (HCC) [S06.5XAA]  Triage Note Patient has been resent for re-evaluation from [facility]. Seen yesterday following fall and diagnosed with small subdural. Staff report increased weakness and not as active today. Patient alert and denies pain. Bruising noted to left forehead from fall yesterday. No new trauma. Denies pain, no nausea, no vomiting   Allergies Allergies  Allergen Reactions   Nsaids Other (See Comments)    Unknown reaction - listed on [facility] 01/13/22   Penicillins Other (See Comments)    Has patient had a PCN reaction causing immediate rash, facial/tongue/throat swelling, SOB or lightheadedness with hypotension: no throat swelling or SOB Has patient had a PCN reaction causing severe rash involving mucus membranes or skin necrosis: unknown, patients states she had sores on her mouth Has patient had a PCN reaction that required hospitalization: no Has patient had a PCN reaction occurring within the last 10 years: no If all of the above answers are "NO", then may proceed with Cephalosp   Septra [Sulfamethoxazole-Trimethoprim] Other (See Comments)    Unknown reaction - listed on [facility] 01/13/2022    Level of Care/Admitting Diagnosis ED Disposition     ED Disposition  Admit   Condition  --   Comment  Hospital Area: [facility] [[id number]]  Level of Care: Med-Surg [16]  May place patient in observation at [facility] or [facility] if equivalent level of care is available:: Yes  Covid Evaluation: Asymptomatic - no recent exposure (last 10 days) testing not required  Diagnosis:  Subdural hematoma [facility]) [[id number]]  Admitting Physician: John Giovanni [[id number]]  Attending Physician: John Giovanni [[id number]]          B Medical/Surgery History Past Medical History:  Diagnosis Date   GERD (gastroesophageal reflux disease)    Hypertension    Past Surgical History:  Procedure Laterality Date   APPENDECTOMY     carpel tunnel release  2002   Dr. Molly Maduro Sypher   ERCP N/A 09/20/2018   Procedure: ENDOSCOPIC RETROGRADE CHOLANGIOPANCREATOGRAPHY (ERCP);  Surgeon: Kerin Salen, MD;  Location: [facility];  Service: Gastroenterology;  Laterality: N/A;   PANCREATIC STENT PLACEMENT  09/20/2018   Procedure: PANCREATIC STENT PLACEMENT;  Surgeon: Kerin Salen, MD;  Location: [facility];  Service: Gastroenterology;;   REMOVAL OF STONES  09/20/2018   Procedure: REMOVAL OF STONES;  Surgeon: Kerin Salen, MD;  Location: [facility];  Service: Gastroenterology;;   Dennison Mascot  09/20/2018   Procedure: Dennison Mascot;  Surgeon: Kerin Salen, MD;  Location: [facility];  Service: Gastroenterology;;   TONSILLECTOMY  1940s   TOTAL HIP ARTHROPLASTY Left 01/14/2022   Procedure: HEMI HIP ARTHROPLASTY;  Surgeon: Durene Romans, MD;  Location: [facility] OR;  Service: Orthopedics;  Laterality: Left;   VAGINAL HYSTERECTOMY     ovaries left in place??? patient on premarin postop despite premenopausal age     A IV Location/Drains/Wounds Patient Lines/Drains/Airways Status     Active Line/Drains/Airways     Name Placement date Placement time Site Days   Peripheral IV 12/09/22 22 G Left;Posterior Hand 12/09/22  2038  Hand  1   Peripheral IV 12/10/22 20 G Left Antecubital 12/10/22  1811  Antecubital  less than 1            Intake/Output Last 24 hours No intake or output data in the 24 hours ending 12/10/22 2226  Labs/Imaging Results for orders placed or performed during the hospital encounter of 12/10/22 (from the past 48 hour(s))  CBC with Differential     Status: Abnormal    Collection Time: 12/10/22  6:10 PM  Result Value Ref Range   WBC 9.4 4.0 - 10.5 K/uL   RBC 3.69 (L) 3.87 - 5.11 MIL/uL   Hemoglobin 12.1 12.0 - 15.0 g/dL   HCT 40.9 81.1 - 91.4 %   MCV 101.6 (H) 80.0 - 100.0 fL   MCH 32.8 26.0 - 34.0 pg   MCHC 32.3 30.0 - 36.0 g/dL   RDW 78.2 95.6 - 21.3 %   Platelets 227 150 - 400 K/uL   nRBC 0.0 0.0 - 0.2 %   Neutrophils Relative % 60 %   Neutro Abs 5.6 1.7 - 7.7 K/uL   Lymphocytes Relative 29 %   Lymphs Abs 2.7 0.7 - 4.0 K/uL   Monocytes Relative 10 %   Monocytes Absolute 0.9 0.1 - 1.0 K/uL   Eosinophils Relative 1 %   Eosinophils Absolute 0.1 0.0 - 0.5 K/uL   Basophils Relative 0 %   Basophils Absolute 0.0 0.0 - 0.1 K/uL   Immature Granulocytes 0 %   Abs Immature Granulocytes 0.04 0.00 - 0.07 K/uL    Comment: Performed at [facility], [address] [place], [place] [address]  Comprehensive metabolic panel     Status: Abnormal   Collection Time: 12/10/22  6:10 PM  Result Value Ref Range   Sodium 138 135 - 145 mmol/L   Potassium 4.2 3.5 - 5.1 mmol/L   Chloride 99 98 - 111 mmol/L   CO2 28 22 - 32 mmol/L   Glucose, Bld 109 (H) 70 - 99 mg/dL    Comment: Glucose reference range applies only to samples taken after fasting for at least 8 hours.   BUN 22 8 - 23 mg/dL   Creatinine, Ser 8.46 (H) 0.44 - 1.00 mg/dL   Calcium 9.6 8.9 - 96.2 mg/dL   Total Protein 7.0 6.5 - 8.1 g/dL   Albumin 3.6 3.5 - 5.0 g/dL   AST 36 15 - 41 U/L   ALT 35 0 - 44 U/L   Alkaline Phosphatase 46 38 - 126 U/L   Total Bilirubin 0.5 0.3 - 1.2 mg/dL   GFR, Estimated 33 (L) >60 mL/min    Comment: (NOTE) Calculated using the CKD-EPI Creatinine Equation (2021)    Anion gap 11 5 - 15    Comment: Performed at [facility], [address], [place], [place] [address]  Troponin I (High Sensitivity)     Status: None   Collection Time: 12/10/22  6:10 PM  Result Value Ref Range   Troponin I (High Sensitivity) 7 <18 ng/L    Comment: (NOTE) Elevated high  sensitivity troponin I (hsTnI) values and significant  changes across serial measurements may suggest ACS but many other  chronic and acute conditions are known to elevate hsTnI results.  Refer to the "Links" section for chest pain algorithms and additional  guidance. Performed at [facility], [address] [place], [place] [address]   Troponin I (High Sensitivity)     Status: None   Collection Time: 12/10/22  7:55 PM  Result Value Ref Range   Troponin  I (High Sensitivity) 7 <18 ng/L    Comment: (NOTE) Elevated high sensitivity troponin I (hsTnI) values and significant  changes across serial measurements may suggest ACS but many other  chronic and acute conditions are known to elevate hsTnI results.  Refer to the "Links" section for chest pain algorithms and additional  guidance. Performed at [facility], [address], [place], [place] [address]    DG Chest 1 View  Result Date: 12/10/2022 CLINICAL DATA:  Increasing weakness EXAM: PORTABLE CHEST 1 VIEW COMPARISON:  12/09/2022 FINDINGS: Cardiac shadow is stable. Lungs are clear bilaterally. No focal infiltrate or effusion is seen. IMPRESSION: No active disease. Electronically Signed   By: Alcide Clever M.D.   On: 12/10/2022 21:58   CT HEAD WO CONTRAST ( )  Result Date: 12/10/2022 CLINICAL DATA:  Altered mental status EXAM: CT HEAD WITHOUT CONTRAST TECHNIQUE: Contiguous axial images were obtained from the base of the skull through the vertex without intravenous contrast. RADIATION DOSE REDUCTION: This exam was performed according to the departmental dose-optimization program which includes automated exposure control, adjustment of the mA and/or kV according to patient size and/or use of iterative reconstruction technique. COMPARISON:  Head CT 12/10/2022 FINDINGS: Brain: The left convexity subdural hematoma is only minimally visible today. This is likely due to redistribution. No new site of hemorrhage. Unchanged volume  loss. There is periventricular hypoattenuation compatible with chronic microvascular disease. Vascular: No abnormal hyperdensity of the major intracranial arteries or dural venous sinuses. No intracranial atherosclerosis. Skull: Left frontal scalp hematoma Sinuses/Orbits: No fluid levels or advanced mucosal thickening of the visualized paranasal sinuses. No mastoid or middle ear effusion. The orbits are normal. IMPRESSION: 1. The left convexity subdural hematoma is only minimally visible today. This is likely due to redistribution. No new site of hemorrhage. 2. Left frontal scalp hematoma. Electronically Signed   By: Deatra Robinson M.D.   On: 12/10/2022 19:38   CT HEAD WO CONTRAST ( )  Result Date: 12/10/2022 CLINICAL DATA:  Head trauma EXAM: CT HEAD WITHOUT CONTRAST TECHNIQUE: Contiguous axial images were obtained from the base of the skull through the vertex without intravenous contrast. RADIATION DOSE REDUCTION: This exam was performed according to the departmental dose-optimization program which includes automated exposure control, adjustment of the mA and/or kV according to patient size and/or use of iterative reconstruction technique. COMPARISON:  12/09/2022 FINDINGS: Brain: Unchanged size of 5 mm left convexity subdural hematoma. No new site of hemorrhage. No midline shift or other mass effect. There is periventricular hypoattenuation compatible with chronic microvascular disease. Mild volume loss. Vascular: Atherosclerotic calcification of the major vessels at the skull base. Skull: No fracture.  Left frontal scalp hematoma. Sinuses/Orbits: No acute finding. Other: None. IMPRESSION: 1. Unchanged size of 5 mm left convexity subdural hematoma. No new site of hemorrhage. 2. Left frontal scalp hematoma without skull fracture. Electronically Signed   By: Deatra Robinson M.D.   On: 12/10/2022 03:46   CT HEAD WO CONTRAST ( )  Result Date: 12/09/2022 CLINICAL DATA:  Head trauma, intracranial arterial injury  suspected; Ataxia, cervical trauma. EXAM: CT HEAD WITHOUT CONTRAST CT CERVICAL SPINE WITHOUT CONTRAST TECHNIQUE: Multidetector CT imaging of the head and cervical spine was performed following the standard protocol without intravenous contrast. Multiplanar CT image reconstructions of the cervical spine were also generated. RADIATION DOSE REDUCTION: This exam was performed according to the departmental dose-optimization program which includes automated exposure control, adjustment of the mA and/or kV according to patient size and/or use of iterative reconstruction technique.  COMPARISON:  CT head and cervical spine 12/03/2022. FINDINGS: CT HEAD FINDINGS Brain: Small, acute left convexity subdural hematoma measuring up to 5 mm in thickness (coronal image 39 series 5). No significant mass effect or midline shift. Cortical gray-white differentiation is preserved. No hydrocephalus. Vascular: No hyperdense vessel or unexpected calcification. Skull: No calvarial fracture or suspicious bone lesion. Skull base is unremarkable. Sinuses/Orbits: Unremarkable. Other: Small left frontal scalp hematoma. CT CERVICAL SPINE FINDINGS Alignment: Unchanged 3 mm degenerative anterolisthesis of C3 on C4 and C4 on C5. No traumatic malalignment. Skull base and vertebrae: No acute fracture. Normal craniocervical junction. No suspicious bone lesions. Soft tissues and spinal canal: No prevertebral fluid or swelling. No visible canal hematoma. Disc levels: Multilevel cervical spondylosis, worst at C6-7, where there is at least mild spinal canal stenosis. Upper chest: Unremarkable. Other: Atherosclerotic calcifications of the carotid bulbs. IMPRESSION: 1. Small, acute left convexity subdural hematoma measuring up to 5 mm in thickness. No significant mass effect or midline shift. 2. Small left frontal scalp hematoma. No calvarial fracture. 3. No acute fracture or traumatic malalignment of the cervical spine. Critical Value/emergent results were  called by telephone at the time of interpretation on 12/09/2022 at 9:25 pm to provider DAVID YAO , who verbally acknowledged these results. Electronically Signed   By: Orvan Falconer M.D.   On: 12/09/2022 21:31   CT Cervical Spine Wo Contrast  Result Date: 12/09/2022 CLINICAL DATA:  Head trauma, intracranial arterial injury suspected; Ataxia, cervical trauma. EXAM: CT HEAD WITHOUT CONTRAST CT CERVICAL SPINE WITHOUT CONTRAST TECHNIQUE: Multidetector CT imaging of the head and cervical spine was performed following the standard protocol without intravenous contrast. Multiplanar CT image reconstructions of the cervical spine were also generated. RADIATION DOSE REDUCTION: This exam was performed according to the departmental dose-optimization program which includes automated exposure control, adjustment of the mA and/or kV according to patient size and/or use of iterative reconstruction technique. COMPARISON:  CT head and cervical spine 12/03/2022. FINDINGS: CT HEAD FINDINGS Brain: Small, acute left convexity subdural hematoma measuring up to 5 mm in thickness (coronal image 39 series 5). No significant mass effect or midline shift. Cortical gray-white differentiation is preserved. No hydrocephalus. Vascular: No hyperdense vessel or unexpected calcification. Skull: No calvarial fracture or suspicious bone lesion. Skull base is unremarkable. Sinuses/Orbits: Unremarkable. Other: Small left frontal scalp hematoma. CT CERVICAL SPINE FINDINGS Alignment: Unchanged 3 mm degenerative anterolisthesis of C3 on C4 and C4 on C5. No traumatic malalignment. Skull base and vertebrae: No acute fracture. Normal craniocervical junction. No suspicious bone lesions. Soft tissues and spinal canal: No prevertebral fluid or swelling. No visible canal hematoma. Disc levels: Multilevel cervical spondylosis, worst at C6-7, where there is at least mild spinal canal stenosis. Upper chest: Unremarkable. Other: Atherosclerotic calcifications of  the carotid bulbs. IMPRESSION: 1. Small, acute left convexity subdural hematoma measuring up to 5 mm in thickness. No significant mass effect or midline shift. 2. Small left frontal scalp hematoma. No calvarial fracture. 3. No acute fracture or traumatic malalignment of the cervical spine. Critical Value/emergent results were called by telephone at the time of interpretation on 12/09/2022 at 9:25 pm to provider DAVID YAO , who verbally acknowledged these results. Electronically Signed   By: Orvan Falconer M.D.   On: 12/09/2022 21:31   DG Pelvis Portable  Result Date: 12/09/2022 CLINICAL DATA:  Recent fall with pelvic pain, initial encounter EXAM: PORTABLE PELVIS 1 VIEWS COMPARISON:  01/14/2022 FINDINGS: Left hip prosthesis is again noted. Postsurgical changes are noted in  the pelvis. Pelvic ring is intact. No acute fracture or dislocation is noted. No soft tissue changes are seen. IMPRESSION: Acute abnormality noted. Electronically Signed   By: Alcide Clever M.D.   On: 12/09/2022 21:12   DG Chest Port 1 View  Result Date: 12/09/2022 CLINICAL DATA:  Recent fall EXAM: PORTABLE CHEST 1 VIEW COMPARISON:  01/13/2022 FINDINGS: Cardiac shadow is within normal limits. Lungs are hypoinflated bilaterally. No focal infiltrate or sizable effusion is seen. No acute bony abnormality is noted. IMPRESSION: No acute abnormality seen. Electronically Signed   By: Alcide Clever M.D.   On: 12/09/2022 21:11    Pending Labs Unresulted Labs (From admission, onward)    None       Vitals/Pain Today's Vitals   12/10/22 1745 12/10/22 2134 12/10/22 2212 12/10/22 2223  BP:  132/60 (!) 145/47   Pulse:  61  89  Resp:  16    Temp:  97.8 F (36.6 C)  98 F (36.7 C)  TempSrc:  Oral    SpO2:  96%  96%  PainSc: 0-No pain       Isolation Precautions No active isolations  Medications Medications - No data to display  Mobility walks with person assist     Focused Assessments Coming in for PT OT eval and  plan   R Recommendations: See Admitting Provider Note  Report given to:   Additional Notes:  Call or epic message for any additional questions

## 2022-12-10 NOTE — ED Notes (Signed)
Attempted to ambulate patient, patient unable to ambulate.

## 2022-12-11 ENCOUNTER — Observation Stay (HOSPITAL_BASED_OUTPATIENT_CLINIC_OR_DEPARTMENT_OTHER): Payer: Medicare Other

## 2022-12-11 DIAGNOSIS — R531 Weakness: Secondary | ICD-10-CM

## 2022-12-11 DIAGNOSIS — R55 Syncope and collapse: Secondary | ICD-10-CM | POA: Diagnosis not present

## 2022-12-11 DIAGNOSIS — S065XAA Traumatic subdural hemorrhage with loss of consciousness status unknown, initial encounter: Secondary | ICD-10-CM | POA: Diagnosis not present

## 2022-12-11 DIAGNOSIS — F0280 Dementia in other diseases classified elsewhere without behavioral disturbance: Secondary | ICD-10-CM

## 2022-12-11 DIAGNOSIS — F32A Depression, unspecified: Secondary | ICD-10-CM

## 2022-12-11 DIAGNOSIS — I10 Essential (primary) hypertension: Secondary | ICD-10-CM | POA: Diagnosis not present

## 2022-12-11 DIAGNOSIS — E039 Hypothyroidism, unspecified: Secondary | ICD-10-CM

## 2022-12-11 DIAGNOSIS — G309 Alzheimer's disease, unspecified: Secondary | ICD-10-CM

## 2022-12-11 LAB — URINALYSIS, ROUTINE W REFLEX MICROSCOPIC
Bilirubin Urine: NEGATIVE
Glucose, UA: NEGATIVE mg/dL
Hgb urine dipstick: NEGATIVE
Ketones, ur: NEGATIVE mg/dL
Leukocytes,Ua: NEGATIVE
Nitrite: NEGATIVE
Protein, ur: NEGATIVE mg/dL
Specific Gravity, Urine: 1.013 (ref 1.005–1.030)
pH: 6 (ref 5.0–8.0)

## 2022-12-11 LAB — ECHOCARDIOGRAM COMPLETE
Area-P 1/2: 2.38 cm2
Calc EF: 65.6 %
Height: 63 in
S' Lateral: 2.1 cm
Single Plane A2C EF: 66.2 %
Single Plane A4C EF: 65.2 %
Weight: 2194.02 oz

## 2022-12-11 LAB — BASIC METABOLIC PANEL
Anion gap: 11 (ref 5–15)
BUN: 20 mg/dL (ref 8–23)
CO2: 23 mmol/L (ref 22–32)
Calcium: 9.1 mg/dL (ref 8.9–10.3)
Chloride: 104 mmol/L (ref 98–111)
Creatinine, Ser: 1.2 mg/dL — ABNORMAL HIGH (ref 0.44–1.00)
GFR, Estimated: 43 mL/min — ABNORMAL LOW (ref >60)
Glucose, Bld: 94 mg/dL (ref 70–99)
Potassium: 4.2 mmol/L (ref 3.5–5.1)
Sodium: 138 mmol/L (ref 135–145)

## 2022-12-11 LAB — TSH: TSH: 5.548 u[IU]/mL — ABNORMAL HIGH (ref 0.350–4.500)

## 2022-12-11 MED ORDER — METOPROLOL SUCCINATE ER 100 MG PO TB24
100.0000 mg | ORAL_TABLET | Freq: Every morning | ORAL | Status: DC
  Administered 2022-12-11 – 2022-12-12 (×2): 100 mg via ORAL
  Filled 2022-12-11 (×2): qty 1

## 2022-12-11 MED ORDER — FESOTERODINE FUMARATE ER 4 MG PO TB24
4.0000 mg | ORAL_TABLET | Freq: Every day | ORAL | Status: DC
  Administered 2022-12-12: 4 mg via ORAL
  Filled 2022-12-11: qty 1

## 2022-12-11 MED ORDER — BUPROPION HCL ER (XL) 150 MG PO TB24
300.0000 mg | ORAL_TABLET | Freq: Every morning | ORAL | Status: DC
  Administered 2022-12-12: 300 mg via ORAL
  Filled 2022-12-11: qty 2

## 2022-12-11 MED ORDER — LEVOTHYROXINE SODIUM 100 MCG PO TABS
100.0000 ug | ORAL_TABLET | Freq: Every day | ORAL | Status: DC
  Administered 2022-12-12: 100 ug via ORAL
  Filled 2022-12-11: qty 1

## 2022-12-11 MED ORDER — RISPERIDONE 0.25 MG PO TABS
0.2500 mg | ORAL_TABLET | Freq: Every day | ORAL | Status: DC
  Administered 2022-12-11: 0.25 mg via ORAL
  Filled 2022-12-11 (×2): qty 1

## 2022-12-11 MED ORDER — ROSUVASTATIN CALCIUM 5 MG PO TABS: 10.0000 mg | ORAL_TABLET | ORAL | Status: DC

## 2022-12-11 MED ORDER — DULOXETINE HCL 60 MG PO CPEP
60.0000 mg | ORAL_CAPSULE | Freq: Every morning | ORAL | Status: DC
  Administered 2022-12-12: 60 mg via ORAL
  Filled 2022-12-11: qty 1

## 2022-12-11 NOTE — TOC CAGE-AID Note (Signed)
Transition of Care [facility]) - CAGE-AID Screening   Patient Details  Name: Michelle Jones MRN: [id number] Date of Birth: 1930/02/25  Transition of Care [facility]) CM/SW Contact:    Erin Sons, LCSW Phone Number: 12/11/2022, 11:11 AM   Clinical Narrative:  89 y.o. female with medical history significant of Alzheimer's dementia. Pt from ALF. H&P notes that pt denies alcohol and other substance use. Not appropriate for CAGE-AID screen.   CAGE-AID Screening: Substance Abuse Screening unable to be completed due to: : Patient unable to participate (Pt not appropriate)

## 2022-12-11 NOTE — Progress Notes (Signed)
Echocardiogram 2D Echocardiogram has been performed.  Toni Amend 12/11/2022, 12:32 PM

## 2022-12-11 NOTE — Evaluation (Addendum)
Physical Therapy Evaluation Patient Details Name: Michelle Jones MRN: [id number] DOB: 01-10-1930 Today's Date: 12/11/2022  History of Present Illness  Patient is 89 y.o. female recently seen in the ED on 4/23 following a fall and CT of head/C-spine negative for acute traumatic injuries at that time and pt discharged back to facility after ~ 7 hours. Pt returned 4/29 after fall at facility, head CT showed small acute left convexity subdural hematoma measuring up to 5 mm in thickness with no significant mass effect or midline shift. Repeat CT showed hematoma stable and was discharged back to facility after ~9 hours in ED. Patient returns to the ED 4/30 from her ALF due to concern for AMS, increased lethargy, and difficulty walking. On repeat CT head today, the left convexity subdural hematoma is only minimally visible and no new site of hemorrhage. PMH significant for GERD, HTN, Lt THA.   Clinical Impression  Michelle Jones is 89 y.o. female admitted with above HPI and diagnosis. Patient is currently limited by functional impairments below (see PT problem list). Patient lives at ALF and per chart review has assist for ADL's and uses RW for mobility at baseline. Unsure how much physical assist pt is receiving to mobilize. Currently she requires Min assist for bed mobility, transfers, and short bout of gait in room. Patient will benefit from continued skilled PT interventions to address impairments and progress independence with mobility, recommending follow therapy services <3 hours/day with 24/7 assist/supervision. Acute PT will follow and progress as able.        Recommendations for follow up therapy are one component of a multi-disciplinary discharge planning process, led by the attending physician.  Recommendations may be updated based on patient status, additional functional criteria and insurance authorization.  Follow Up Recommendations       Assistance Recommended at Discharge Frequent or  constant Supervision/Assistance  Patient can return home with the following  A lot of help with walking and/or transfers;A lot of help with bathing/dressing/bathroom;Assistance with cooking/housework;Direct supervision/assist for medications management;Assist for transportation;Help with stairs or ramp for entrance    Equipment Recommendations None recommended by PT  Recommendations for Other Services       Functional Status Assessment Patient has had a recent decline in their functional status and demonstrates the ability to make significant improvements in function in a reasonable and predictable amount of time.     Precautions / Restrictions Precautions Precautions: Fall Restrictions Weight Bearing Restrictions: No      Mobility  Bed Mobility Overal bed mobility: Needs Assistance Bed Mobility: Supine to Sit     Supine to sit: Min assist, HOB elevated     General bed mobility comments: pt initaited bringing LE's off EOB fully and min assist to fully raise trunk up to sit EOB.    Transfers Overall transfer level: Needs assistance Equipment used: Rolling walker (2 wheels) Transfers: Sit to/from Stand, Bed to chair/wheelchair/BSC Sit to Stand: Min assist   Step pivot transfers: Min assist       General transfer comment: Cues for hand placement to power up with bil UE from EOB, min assist to complete rise.    Ambulation/Gait Ambulation/Gait assistance: Min assist Gait Distance (Feet): 8 Feet Assistive device: Rolling walker (2 wheels) Gait Pattern/deviations: Step-to pattern, Decreased step length - right, Decreased step length - left, Decreased stride length, Shuffle Gait velocity: Medical illustrator  Modified Rankin (Stroke Patients Only)       Balance Overall balance assessment: Needs assistance Sitting-balance support: Feet supported, Bilateral upper extremity supported Sitting balance-Leahy Scale: Fair Sitting  balance - Comments: pt requires UE support at EOB, posterior lean if no support Postural control: Posterior lean Standing balance support: During functional activity, Bilateral upper extremity supported, Reliant on assistive device for balance Standing balance-Leahy Scale: Poor                               Pertinent Vitals/Pain Pain Assessment Pain Assessment: Faces Faces Pain Scale: No hurt Pain Intervention(s): Monitored during session, Repositioned    Home Living Family/patient expects to be discharged to:: Assisted living                 Home Equipment: Rollator (4 wheels)      Prior Function Prior Level of Function : Patient poor historian/Family not available;History of Falls (last six months);Needs assist             Mobility Comments: pt reports the staff assists her when they are around, unsure what level of care pt recieving at ALF, per chart review pt typically ambulates with a RW ADLs Comments: poor historian, reports toileting self, assist with showers at ALF     Hand Dominance   Dominant Hand: Right    Extremity/Trunk Assessment                Communication   Communication: HOH  Cognition Arousal/Alertness: Awake/alert Behavior During Therapy: WFL for tasks assessed/performed Overall Cognitive Status: No family/caregiver present to determine baseline cognitive functioning                                 General Comments: pt oriented to self only, able to name city, unaware in hospital, unable to correctly state date, month, or year. follows cues with extra time and repetition.        General Comments      Exercises     Assessment/Plan    PT Assessment Patient needs continued PT services  PT Problem List Decreased strength;Decreased activity tolerance;Decreased balance;Decreased mobility;Decreased cognition;Decreased knowledge of use of DME;Decreased safety awareness;Decreased knowledge of  precautions;Decreased skin integrity       PT Treatment Interventions DME instruction;Gait training;Stair training;Functional mobility training;Therapeutic activities;Therapeutic exercise;Balance training;Neuromuscular re-education;Cognitive remediation;Patient/family education;Wheelchair mobility training    PT Goals (Current goals can be found in the Care Plan section)  Acute Rehab PT Goals Patient Stated Goal: pt unable to participate in goal setting PT Goal Formulation: Patient unable to participate in goal setting Time For Goal Achievement: 12/25/22 Potential to Achieve Goals: Fair    Frequency Min 3X/week     Co-evaluation               AM-PAC PT "6 Clicks" Mobility  Outcome Measure Help needed turning from your back to your side while in a flat bed without using bedrails?: A Little Help needed moving from lying on your back to sitting on the side of a flat bed without using bedrails?: A Little Help needed moving to and from a bed to a chair (including a wheelchair)?: A Little Help needed standing up from a chair using your arms (e.g., wheelchair or bedside chair)?: A Little Help needed to walk in hospital room?: A Little Help needed climbing 3-5 steps with a railing? : Total  6 Click Score: 16    End of Session Equipment Utilized During Treatment: Gait belt Activity Tolerance: Patient tolerated treatment well Patient left: in chair;with call bell/phone within reach;with chair alarm set Nurse Communication: Mobility status PT Visit Diagnosis: Unsteadiness on feet (R26.81);Other abnormalities of gait and mobility (R26.89);Muscle weakness (generalized) (M62.81);Difficulty in walking, not elsewhere classified (R26.2)    Time: 1610-9604 PT Time Calculation (min) (ACUTE ONLY): 28 min   Charges:   PT Evaluation $PT Eval Moderate Complexity: 1 Mod PT Treatments $Therapeutic Activity: 8-22 mins        Wynn Maudlin, DPT Acute Rehabilitation Services Office  [phone]  12/11/22 12:31 PM

## 2022-12-11 NOTE — Progress Notes (Signed)
All four bed rails are up at patient's request. Patient educated to call if she needs to get up. Pt verbalized understanding.

## 2022-12-11 NOTE — Evaluation (Addendum)
Occupational Therapy Evaluation Patient Details Name: Michelle Jones MRN: [id number] DOB: 1930-06-11 Today's Date: 12/11/2022   History of Present Illness 89 y.o. female recently seen in the ED on 4/23 following a fall and CT of head/C-spine negative for acute traumatic injuries at that time and pt discharged back to facility after ~ 7 hours. Pt returned 4/29 after fall at facility, head CT showed small acute left convexity subdural hematoma measuring up to 5 mm in thickness with no significant mass effect or midline shift. Repeat CT showed hematoma stable and was discharged back to facility after ~9 hours in ED. Patient returns to the ED 4/30 from her ALF due to concern for AMS, increased lethargy, and difficulty walking. On repeat CT head today, the left convexity subdural hematoma is only minimally visible and no new site of hemorrhage. PMH significant for GERD, HTN, Lt THA.   Clinical Impression   PT admitted with s/pf all with subdural hematoma [facility]. Pt currently with functional limitiations due to the deficits listed below (see OT problem list). Pt at baseline lives at ALF where she uses a rollator, walks to the dining hall with cues from staff that the food is ready. Pt requires (A) for LB dressing/ bathing but can dress her UB. Pt wears a brief for incontinence at baseline. Pt incorrectly introduced son but self corrected and states "lets start over, this is my son." Pt currently with strong posterior lean and bumping RW on R side into objects.  Son expressed COVID followed by UTI with x2 falls with concerns that pt needs to strengthen prior to return to ALF. Pt was being followed by HHPT prior to this fall with x2 visits total due to repeat medical needs . Pt will benefit from skilled OT to increase their independence and safety with adls and balance to allow discharge skilled inpatient follow up therapy, <3 hours/day. .       Recommendations for follow up therapy are one component of a  multi-disciplinary discharge planning process, led by the attending physician.  Recommendations may be updated based on patient status, additional functional criteria and insurance authorization.   Assistance Recommended at Discharge    Patient can return home with the following A little help with walking and/or transfers;A lot of help with bathing/dressing/bathroom;Assist for transportation    Functional Status Assessment  Patient has had a recent decline in their functional status and demonstrates the ability to make significant improvements in function in a reasonable and predictable amount of time.  Equipment Recommendations  BSC/3in1;Other (comment) (RW)    Recommendations for Other Services       Precautions / Restrictions Precautions Precautions: Fall      Mobility Bed Mobility Overal bed mobility: Needs Assistance Bed Mobility: Rolling, Supine to Sit Rolling: Min assist   Supine to sit: Mod assist     General bed mobility comments: HOB elevated and needed max vc to sequence exiting on the L side of the bed. pt needs A to facilitate trunk from bed surface    Transfers Overall transfer level: Needs assistance Equipment used: Rolling walker (2 wheels) Transfers: Sit to/from Stand Sit to Stand: Max assist           General transfer comment: pt needed max (A) x3 attempts from standard height mattress. pt with decreased (A) needed from elevated surfaces      Balance Overall balance assessment: Needs assistance Sitting-balance support: Feet supported, Bilateral upper extremity supported Sitting balance-Leahy Scale: Fair  Standing balance support: Bilateral upper extremity supported, During functional activity, Reliant on assistive device for balance Standing balance-Leahy Scale: Poor                             ADL either performed or assessed with clinical judgement   ADL Overall ADL's : Needs assistance/impaired Eating/Feeding: Modified  independent;Bed level   Grooming: Wash/dry face;Modified independent;Bed level   Upper Body Bathing: Moderate assistance   Lower Body Bathing: Maximal assistance   Upper Body Dressing : Moderate assistance   Lower Body Dressing: Maximal assistance   Toilet Transfer: Moderate assistance;BSC/3in1;Rolling walker (2 wheels)   Toileting- Clothing Manipulation and Hygiene: Maximal assistance       Functional mobility during ADLs: Moderate assistance;Rolling walker (2 wheels)       Vision Baseline Vision/History: 1 Wears glasses Ability to See in Adequate Light: 0 Adequate Patient Visual Report: No change from baseline Additional Comments: able to read the clock and size 12 font in central vision     Perception     Praxis      Pertinent Vitals/Pain Pain Assessment Pain Assessment: No/denies pain     Hand Dominance Right   Extremity/Trunk Assessment Upper Extremity Assessment Upper Extremity Assessment: RUE deficits/detail;LUE deficits/detail RUE Deficits / Details: AROM WFL 5th digit contracture- son reports hx of tendon repair with poor splint wear LUE Deficits / Details: AROM WFL 5th digit contracture   Lower Extremity Assessment Lower Extremity Assessment: Generalized weakness   Cervical / Trunk Assessment Cervical / Trunk Assessment: Kyphotic   Communication Communication Communication: HOH   Cognition Arousal/Alertness: Awake/alert Behavior During Therapy: WFL for tasks assessed/performed Overall Cognitive Status: History of cognitive impairments - at baseline                                 General Comments: unaware of reason for admission. Report admitted for cuts on arm     General Comments  L UE forearm with wounds s/p x2 falls. prior stitches required    Exercises     Shoulder Instructions      Home Living Family/patient expects to be discharged to:: Skilled nursing facility                             Home  Equipment: Rollator (4 wheels)   Additional Comments: currently has frame around toilet but son states they plan to get a BSC prior to ALF return. Family is wanting increased care prior to ALF . Agreeable to SNF and wants [facility]      Prior Functioning/Environment Prior Level of Function : Needs assist;Patient poor historian/Family not available;History of Falls (last six months)  Cognitive Assist : ADLs (cognitive);Mobility (cognitive) Mobility (Cognitive): Intermittent cues   Physical Assist : ADLs (physical)   ADLs (physical): Bathing;Dressing;IADLs Mobility Comments: use a Rollator ADLs Comments: pt reports at baseline she "washes the top half and the staff help with the private parts"        OT Problem List: Decreased activity tolerance;Impaired balance (sitting and/or standing);Decreased cognition;Decreased safety awareness;Decreased knowledge of use of DME or AE;Decreased knowledge of precautions      OT Treatment/Interventions: Self-care/ADL training;Therapeutic exercise;Energy conservation;DME and/or AE instruction;Manual therapy;Modalities;Therapeutic activities;Cognitive remediation/compensation;Patient/family education;Balance training    OT Goals(Current goals can be found in the care plan section) Acute Rehab OT Goals Patient Stated  Goal: to go skilled prior to ALF OT Goal Formulation: With patient/family Time For Goal Achievement: 12/25/22 Potential to Achieve Goals: Good  OT Frequency: Min 1X/week    Co-evaluation              AM-PAC OT "6 Clicks" Daily Activity     Outcome Measure Help from another person eating meals?: A Little Help from another person taking care of personal grooming?: A Little Help from another person toileting, which includes using toliet, bedpan, or urinal?: A Lot Help from another person bathing (including washing, rinsing, drying)?: A Lot Help from another person to put on and taking off regular upper body clothing?:  A Little Help from another person to put on and taking off regular lower body clothing?: A Lot 6 Click Score: 15   End of Session Equipment Utilized During Treatment: Gait belt;Rolling walker (2 wheels) Nurse Communication: Mobility status;Precautions  Activity Tolerance: Patient tolerated treatment well Patient left: in chair;with call bell/phone within reach;with family/visitor present  OT Visit Diagnosis: Unsteadiness on feet (R26.81);Muscle weakness (generalized) (M62.81);Repeated falls (R29.6)                Time: 4098-1191 OT Time Calculation (min): 31 min Charges:  OT General Charges $OT Visit: 1 Visit OT Evaluation $OT Eval Moderate Complexity: 1 Mod OT Treatments $Self Care/Home Management : 8-22 mins   Brynn, OTR/L  Acute Rehabilitation Services Office: [phone] .   Mateo Flow 12/11/2022, 2:48 PM

## 2022-12-11 NOTE — NC FL2 (Signed)
[place] MEDICAID FL2 LEVEL OF CARE FORM     IDENTIFICATION  Patient Name: Michelle Jones Birthdate: October 06, 1929 Sex: female Admission Date (Current Location): 12/10/2022  [facility] and [place] Number:  Producer, television/film/video and Address:  [facility], [address], [place], [place] [address]      Provider Number: [id number]  Attending Physician Name and Address:  Alberteen Sam, *  Relative Name and Phone Number:  Fonnie Birkenhead) [phone]    Current Level of Care: Hospital Recommended Level of Care: Skilled Nursing Facility Prior Approval Number:    Date Approved/Denied:   PASRR Number: [id number]  Discharge Plan: SNF    Current Diagnoses: Patient Active Problem List   Diagnosis Date Noted   Subdural hematoma (HCC) 12/10/2022   Generalized weakness 12/10/2022   Closed left hip fracture (HCC) 01/14/2022   S/P left hip hemiarthroplasty 01/14/2022   Dementia due to Alzheimer's disease (HCC) 12/26/2021   Choledocholithiasis 09/18/2018   Right ureteral stone 09/18/2018   Elevated LFTs 09/18/2018   Calculus of bile duct without cholecystitis with obstruction    UTI (urinary tract infection) 05/09/2015   Essential hypertension 05/09/2015   Hypothyroidism 05/09/2015   Depression 05/09/2015   AKI (acute kidney injury) (HCC) 05/09/2015   Thrombocytopenia (HCC) 05/09/2015   Hypokalemia 05/09/2015   Elevated troponin 05/09/2015   Anemia 05/09/2015   E. coli sepsis (HCC) 05/09/2015   Sepsis (HCC) 05/05/2015    Orientation RESPIRATION BLADDER Height & Weight     Self, Place  Normal Incontinent, External catheter (External Urinary Catheter) Weight: 137 lb 2 oz (62.2 kg) Height:  5\' 3"  (160 cm)  BEHAVIORAL SYMPTOMS/MOOD NEUROLOGICAL BOWEL NUTRITION STATUS      Continent (WDL) Diet (Please see discharge summary)  AMBULATORY STATUS COMMUNICATION OF NEEDS Skin   Limited Assist Verbally Other (Comment)  (Abrasion,arm,L,Ecchymosis,face,L,Wound/Incision LDAs)                       Personal Care Assistance Level of Assistance  Bathing, Feeding, Dressing Bathing Assistance: Maximum assistance Feeding assistance: Independent Dressing Assistance: Maximum assistance     Functional Limitations Info  Sight, Hearing, Speech Sight Info: Impaired Hearing Info: Impaired Speech Info: Adequate    SPECIAL CARE FACTORS FREQUENCY  PT (By licensed PT), OT (By licensed OT)     PT Frequency: 5x min weekly OT Frequency: 5x min weekly            Contractures Contractures Info: Not present    Additional Factors Info  Code Status, Allergies Code Status Info: DNR Allergies Info: Nsaids,Penicillins,Septra (sulfamethoxazole-trimethoprim)           Current Medications (12/11/2022):  This is the current hospital active medication list Current Facility-Administered Medications  Medication Dose Route Frequency Provider Last Rate Last Admin   acetaminophen (TYLENOL) tablet 650 mg  650 mg Oral Q6H PRN John Giovanni, MD       Or   acetaminophen (TYLENOL) suppository 650 mg  650 mg Rectal Q6H PRN John Giovanni, MD       [START ON 12/12/2022] buPROPion (WELLBUTRIN XL) 24 hr tablet 300 mg  300 mg Oral q morning Danford, Earl Lites, MD       [START ON 12/12/2022] DULoxetine (CYMBALTA) DR capsule 60 mg  60 mg Oral q morning Danford, Earl Lites, MD       Melene Muller ON 12/12/2022] fesoterodine (TOVIAZ) tablet 4 mg  4 mg Oral Daily Danford, Earl Lites, MD       [  START ON 12/12/2022] levothyroxine (SYNTHROID) tablet 100 mcg  100 mcg Oral Q0600 Danford, Earl Lites, MD       metoprolol succinate (TOPROL-XL) 24 hr tablet 100 mg  100 mg Oral q morning Danford, Earl Lites, MD       risperiDONE (RISPERDAL) tablet 0.25 mg  0.25 mg Oral QHS Danford, Earl Lites, MD       [START ON 12/13/2022] rosuvastatin (CRESTOR) tablet 10 mg  10 mg Oral Q Fri Danford, Earl Lites, MD         Discharge  Medications: Please see discharge summary for a list of discharge medications.  Relevant Imaging Results:  Relevant Lab Results:   Additional Information [id number]  Delilah Shan, LCSWA

## 2022-12-11 NOTE — Progress Notes (Signed)
  Progress Note   Patient: Michelle Jones UEA:[id number] DOB: 10-Sep-1929 DOA: 12/10/2022     0 DOS: the patient was seen and examined on 12/11/2022 at 11:05AM      Brief hospital course: Michelle Jones is a 89 y.o. F with dementia, lives in ALF, HTN, HLD, and hypothyroidism who presented with confusion, generalized weakness.  Had COVID a few weeks ago, since then has been very weak, fallen multiple times.  4/23 in ER noted to have UTI, discharged on cephalexin.  4/29, returned to ER, now with SDH, stable with observation, discharged back to ALF.    On day of admission, could not stand, sent back to ER.     Assessment and Plan: Subdural hematoma Generalized weakness due to deconditioning, recent SDH, recent COVID, and recent UTI Repeat UA suggests cystitis is resolved.  No active COVID symptoms.  Does not appear dehydrated.  No treatment possible for SDH. - PT eval - TOC consult   Essential hypertension Blood pressure normal - Continue metoprolol - Hold furosemide for 1 day  Dementia -Continue bupropion, Cymbalta, Risperdal  Hyperlipidemia -Continue Crestor  Hypothyroidism -Continue levothyroxine      Subjective: Patient has no complaints, no fever, tach, chest pain, dyspnea.  Nursing Nursing of no concerns.     Physical Exam: BP (!) 151/56 (BP Location: Right Arm)   Pulse (!) 58   Temp 97.8 F (36.6 C) (Oral)   Resp 17   Ht 5\' 3"  (1.6 m)   Wt 62.2 kg   SpO2 99%   BMI 24.29 kg/m   Elderly adult female, lying in bed, no acute distress Eating breakfast RRR, no murmurs, no peripheral edema Respiratory rate normal, lungs clear without rales or wheezes Abdomen soft without tenderness palpation or guarding, no ascites or distention Attentive to conversation, appears to have some mild short-term memory loss Moves all extremities with generalized weakness but symmetric strength, face symmetric, speech fluent    Data Reviewed: CBC unremarkable Basic metabolic  panel shows creatinine 1.2, at baseline Urinalysis clear TSH 5.5    Family Communication: Son at the bedside    Disposition: Status is: Observation Patient had recent COVID, multiple falls.  Her continued following, including developing a subdural hematoma from one of the falls, strongly supports that her ALF is not safe level of care at this time, and she would benefit from skilled rehab.        Author: Alberteen Sam, MD 12/11/2022 2:07 PM  For on call review [email or web address].

## 2022-12-11 NOTE — Hospital Course (Addendum)
Mrs. Candler is a 89 y.o. F with dementia, lives in ALF, HTN, HLD, and hypothyroidism who presented with confusion, generalized weakness.  Had COVID a few weeks ago, since then has been very weak, fallen multiple times.  4/23 in ER noted to have UTI, discharged on cephalexin.  4/29, returned to ER, now with SDH, stable with observation, discharged back to ALF.    On day of admission, could not stand, sent back to ER.

## 2022-12-11 NOTE — TOC Initial Note (Addendum)
Transition of Care [facility]) - Initial/Assessment Note    Patient Details  Name: Michelle Jones MRN: [id number] Date of Birth: 1929-10-13  Transition of Care [facility]) CM/SW Contact:    Delilah Shan, LCSWA Phone Number: 12/11/2022, 2:00 PM  Clinical Narrative:                    CSW received consult for possible SNF placement at time of discharge. Due to patients current orientation CSW spoke with patients son Gery Pray regarding PT recommendation of SNF placement at time of discharge.  Patients son  expressed understanding of PT recommendation and is agreeable to SNF placement at time of discharge. Patients son reports PTA patient comes from [facility]. Patients son gave CSW permission to fax out for SNF placement.Patient reports preference for [facility] . CSW discussed insurance authorization process. No further questions reported at this time. CSW started insurance authorization for patient. Auth ID # [id number]. CSW will add patients facility choice when determined by patients son.CSW to continue to follow and assist with discharge planning needs.   Expected Discharge Plan: Skilled Nursing facility   Patient Goals and CMS Choice     Choice offered to / list presented to : Adult Children (Patients son)      Expected Discharge Plan and Services In-house Referral: Clinical Social Work     Living arrangements for the past 2 months: Assisted Living Facility ([facility])                                      Prior Living Arrangements/Services Living arrangements for the past 2 months: Assisted Living Facility ([facility]) Lives with:: Facility Resident Patient language and need for interpreter reviewed:: Yes        Need for Family Participation in Patient Care: Yes (Comment) Care giver support system in place?: Yes (comment)   Criminal Activity/Legal Involvement Pertinent to Current Situation/Hospitalization: No - Comment as needed  Activities of Daily Living Home  Assistive Devices/Equipment: Wheelchair, Environmental consultant (specify type) ADL Screening (condition at time of admission) Patient's cognitive ability adequate to safely complete daily activities?: No Is the patient deaf or have difficulty hearing?: No Does the patient have difficulty seeing, even when wearing glasses/contacts?: No Does the patient have difficulty concentrating, remembering, or making decisions?: Yes Patient able to express need for assistance with ADLs?: Yes Does the patient have difficulty dressing or bathing?: Yes Independently performs ADLs?: No Communication: Independent Dressing (OT): Needs assistance Is this a change from baseline?: Pre-admission baseline Grooming: Needs assistance Is this a change from baseline?: Pre-admission baseline Feeding: Independent Bathing: Needs assistance Is this a change from baseline?: Pre-admission baseline Toileting: Needs assistance Is this a change from baseline?: Pre-admission baseline In/Out Bed: Needs assistance Is this a change from baseline?: Pre-admission baseline Walks in Home: Needs assistance Is this a change from baseline?: Pre-admission baseline Does the patient have difficulty walking or climbing stairs?: Yes Weakness of Legs: Both Weakness of Arms/Hands: None  Permission Sought/Granted Permission sought to share information with : Case Manager, Magazine features editor, Family Supports Permission granted to share information with : No  Share Information with NAME: Due to patients current prientation CSW spoke with patients son Gery Pray  Permission granted to share info w AGENCY: Due to patients current prientation CSW spoke with patients son Barry/ALF  Permission granted to share info w Relationship: Due to patients current prientation CSW  spoke with patients son Gery Pray  Permission granted to share info w Contact Information: Due to patients current prientation CSW spoke with patients son Gery Pray [phone]  Emotional  Assessment       Orientation: : Oriented to Self, Oriented to Place Alcohol / Substance Use: Not Applicable Psych Involvement: No (comment)  Admission diagnosis:  Subdural hematoma (HCC) [S06.5XAA] Encephalopathy [G93.40] Generalized weakness [R53.1] Unable to ambulate [R26.2] Patient Active Problem List   Diagnosis Date Noted   Subdural hematoma (HCC) 12/10/2022   Generalized weakness 12/10/2022   Closed left hip fracture (HCC) 01/14/2022   S/P left hip hemiarthroplasty 01/14/2022   Dementia due to Alzheimer's disease (HCC) 12/26/2021   Choledocholithiasis 09/18/2018   Right ureteral stone 09/18/2018   Elevated LFTs 09/18/2018   Calculus of bile duct without cholecystitis with obstruction    UTI (urinary tract infection) 05/09/2015   Essential hypertension 05/09/2015   Hypothyroidism 05/09/2015   Depression 05/09/2015   AKI (acute kidney injury) (HCC) 05/09/2015   Thrombocytopenia (HCC) 05/09/2015   Hypokalemia 05/09/2015   Elevated troponin 05/09/2015   Anemia 05/09/2015   E. coli sepsis (HCC) 05/09/2015   Sepsis (HCC) 05/05/2015   PCP:  System, Provider Not In Pharmacy:   [facility] - [place], [place] - [address] [place] [address] Phone: [phone] Fax: [phone]     Social Determinants of Health (SDOH) Social History: SDOH Screenings   Tobacco Use: Low Risk  (12/10/2022)   SDOH Interventions:     Readmission Risk Interventions     No data to display

## 2022-12-11 NOTE — Care Management Obs Status (Signed)
MEDICARE OBSERVATION STATUS NOTIFICATION   Patient Details  Name: Michelle Jones MRN: [id number] Date of Birth: 1930-05-20   Medicare Observation Status Notification Given:  Yes    Gala Lewandowsky, RN 12/11/2022, 2:54 PM

## 2022-12-12 DIAGNOSIS — S065XAA Traumatic subdural hemorrhage with loss of consciousness status unknown, initial encounter: Secondary | ICD-10-CM | POA: Diagnosis not present

## 2022-12-12 MED ORDER — TRAMADOL HCL 50 MG PO TABS: 50.0000 mg | ORAL_TABLET | Freq: Four times a day (QID) | ORAL | 0 refills | Status: AC | PRN

## 2022-12-12 NOTE — Progress Notes (Signed)
Physical Therapy Treatment Patient Details Name: Michelle Jones MRN: [id number] DOB: 29-Dec-1929 Today's Date: 12/12/2022   History of Present Illness 89 y.o. female recently seen in the ED on 4/23 following a fall and CT of head/C-spine negative for acute traumatic injuries at that time and pt discharged back to facility after ~ 7 hours. Pt returned 4/29 after fall at facility, head CT showed small acute left convexity subdural hematoma measuring up to 5 mm in thickness with no significant mass effect or midline shift. Repeat CT showed hematoma stable and was discharged back to facility after ~9 hours in ED. Patient returns to the ED 4/30 from her ALF due to concern for AMS, increased lethargy, and difficulty walking. On repeat CT head today, the left convexity subdural hematoma is only minimally visible and no new site of hemorrhage. PMH significant for GERD, HTN, Lt THA.    PT Comments    Pt received in supine, sleeping but awakened when blinds opened more fully by PTA and cooperative, agreeable to therapy session with emphasis on transfer, gait training and vitals assessment. Pt needing up to maxA for sit<>stand and with posterior LOB with initial STS, needing min to modA for dynamic standing tasks with RW after initial balance recovered. At baseline, pt is Supervision level with assistive device and currently appears well below functional baseline due to multiple recent readmissions and decreased recent mobility. Pt following commands well, report symptoms of fatigue/BLE weakness while performing standing tasks and BP checked, pt noted to be orthostatic. RN/MD notified she may benefit from thigh-high TED hose for improved hemodynamic stability. Pt remains appropriate for short term low to moderate intensity post-acute rehab <3h per day.   Orthostatic BPs HR 60's bpm with exertion, SpO2 96% on RA when pt not gripping RW (noisy signal with amb) Sitting 133/55 (76)  Standing initially: "my legs feel a  little weak" 101/49 (62)  Standing: "my legs feel weak" 101/51 (65)  Recliner post-exertion  142/103 (116)     Recommendations for follow up therapy are one component of a multi-disciplinary discharge planning process, led by the attending physician.  Recommendations may be updated based on patient status, additional functional criteria and insurance authorization.  Follow Up Recommendations  Can patient physically be transported by private vehicle: No    Assistance Recommended at Discharge Frequent or constant Supervision/Assistance  Patient can return home with the following A lot of help with walking and/or transfers;A lot of help with bathing/dressing/bathroom;Assistance with cooking/housework;Direct supervision/assist for medications management;Assist for transportation;Help with stairs or ramp for entrance   Equipment Recommendations  None recommended by PT (TBD)    Recommendations for Other Services       Precautions / Restrictions Precautions Precautions: Fall Precaution Comments: check BP Restrictions Weight Bearing Restrictions: No     Mobility  Bed Mobility Overal bed mobility: Needs Assistance Bed Mobility: Supine to Sit     Supine to sit: Min assist, HOB elevated     General bed mobility comments: HOB elevated and needed max vc to sequence exiting on the L side of the bed. pt needs A to facilitate trunk from bed surface along with bed rail (she has bed rail also at ALF) and demos posterior lean upon sitting needing modA for initial seated balance progressing to minA/min guard.    Transfers Overall transfer level: Needs assistance Equipment used: Rolling walker (2 wheels) Transfers: Sit to/from Stand Sit to Stand: Max assist, Mod assist   Step pivot transfers: Mod assist  General transfer comment: pt needed maxA for initial standing attempt from standard height mattress. ModA standing from recliner chair and second attempt from EOB<>RW. Dense cues  for safe UE placement with ~25-50% carryover of cues within session.    Ambulation/Gait Ambulation/Gait assistance: Min assist, Mod assist Gait Distance (Feet): 20 Feet (x2 with seated break, also pivotal steps ~47ft at bedside on first standing trial) Assistive device: Rolling walker (2 wheels) Gait Pattern/deviations: Step-to pattern, Decreased step length - right, Decreased step length - left, Shuffle, Trunk flexed     Pre-gait activities: standing hip flexion x5 reps BLE General Gait Details: downward gaze/forward head posture needs frequent cues for forward gaze; good proximity to RW and neeidng up to minA for RW management when turning. up to modA on first trial due to posterior lean and c/o BLE weakness, pt noted to be orthsotatic, MD/RN notified see comments for VS.   Stairs             Wheelchair Mobility    Modified Rankin (Stroke Patients Only)       Balance Overall balance assessment: Needs assistance Sitting-balance support: Feet supported, Bilateral upper extremity supported Sitting balance-Leahy Scale:  (Poor initially progressing to fair) Sitting balance - Comments: pt requires UE support at EOB, posterior lean if no support   Standing balance support: Bilateral upper extremity supported, During functional activity, Reliant on assistive device for balance Standing balance-Leahy Scale: Poor Standing balance comment: posterior bias needs RW and external assist                            Cognition Arousal/Alertness: Awake/alert Behavior During Therapy: WFL for tasks assessed/performed Overall Cognitive Status: History of cognitive impairments - at baseline                                 General Comments: Slow processing, some carryover of instruction for safe UE placement during session but needs dense multimodal cues for safety at times. Pt benefits from errorless learning strategy given cognitive deficit and recalls 25-50% of  safety cues within session. Pleasantly cooperative.        Exercises Other Exercises Other Exercises: lateral leans x3 reps ea with cues for advancing hips at EOB Other Exercises: BLE standing hip flexion x5 reps (prior to seated break) Other Exercises: STS x 3 reps (with seated breaks) Other Exercises: `    General Comments General comments (skin integrity, edema, etc.): L UE forearm with wounds s/p x2 falls. prior stitches required, son asking about if they will be removed tomorrow, RN/MD notified of his question and also that pt was orthostatic      Pertinent Vitals/Pain Pain Assessment Pain Assessment: No/denies pain Pain Intervention(s): Monitored during session, Repositioned     PT Goals (current goals can now be found in the care plan section) Acute Rehab PT Goals Patient Stated Goal: Per son, for her to get stronger prior to going home, she is much weaker since multiple recent admissions and Covid. PT Goal Formulation: Patient unable to participate in goal setting Time For Goal Achievement: 12/25/22 Progress towards PT goals: Progressing toward goals    Frequency    Min 3X/week      PT Plan Current plan remains appropriate       AM-PAC PT "6 Clicks" Mobility   Outcome Measure  Help needed turning from your back to your side while in  a flat bed without using bedrails?: A Little Help needed moving from lying on your back to sitting on the side of a flat bed without using bedrails?: A Lot Help needed moving to and from a bed to a chair (including a wheelchair)?: A Lot Help needed standing up from a chair using your arms (e.g., wheelchair or bedside chair)?: A Lot Help needed to walk in hospital room?: A Little Help needed climbing 3-5 steps with a railing? : Total 6 Click Score: 13    End of Session Equipment Utilized During Treatment: Gait belt Activity Tolerance: Patient tolerated treatment well Patient left: in chair;with call bell/phone within reach;with  chair alarm set;with family/visitor present Nurse Communication: Mobility status;Other (comment) (orthostatic BP readings, son questions about stitches) PT Visit Diagnosis: Unsteadiness on feet (R26.81);Other abnormalities of gait and mobility (R26.89);Muscle weakness (generalized) (M62.81);Difficulty in walking, not elsewhere classified (R26.2)     Time: 4098-1191 PT Time Calculation (min) (ACUTE ONLY): 39 min  Charges:  $Gait Training: 8-22 mins $Therapeutic Exercise: 8-22 mins $Therapeutic Activity: 8-22 mins                     Coe Angelos P., PTA Acute Rehabilitation Services Secure Chat Preferred 9a-5:30pm Office: [phone]    Dorathy Kinsman [facility] 12/12/2022, 11:49 AM

## 2022-12-12 NOTE — Discharge Summary (Signed)
Physician Discharge Summary   Patient: Michelle Jones MRN: [id number] DOB: Nov 14, 1929  Admit date:     12/10/2022  Discharge date: 12/12/22  Discharge Physician: Alberteen Sam   PCP: System, Provider Not In     Recommendations at discharge:  Follow up with [facility] in 1-2 weeks for subdural hematoma Apply Ted hose daily: If persistent orthostatic symptoms despite Ted hose, recommend replacing metoprolol with alternative and adding abdominal binder and salt in diet If orthostasis resolves, resume Lasix     Discharge Diagnoses: Principal Problem:   Subdural hematoma (HCC) Active Problems:   Essential hypertension   Hypothyroidism   Depression   Dementia due to Alzheimer's disease [facility])   Generalized weakness      Hospital Course: Michelle Jones is a 89 y.o. F with dementia, lives in ALF, HTN, HLD, and hypothyroidism who presented with confusion, generalized weakness.  Had COVID a few weeks ago, since then has been very weak, fallen multiple times.  4/23 in ER noted to have UTI, discharged on cephalexin.    On 4/29, fell again, returned to ER, found to have SDH.  This was observed and repeat CT was stable and clinically she was stable, so decision made jointly to return to ALF.    On the day of admission, she could not stand and appeared more sluggish and confused, and was sent back to ER.    Subdural hematoma Generalized weakness due to deconditioning, recent SDH, recent COVID, and recent UTI Electrolytes and renal function normal, did not appear dehydrated.  Repeat UA suggested cystitis was resolved.  No active COVID symptoms.     Given recurrent falls resulting in potentially serious injury, short term rehab recommended.      Essential hypertension Blood pressure controlled.    Orthostatic hypotension Furosemide held.  Did not appear by labs or exam to be dehydrated, suspect autonomic dysfunction.  Ted hose started.   Dementia On bupropion,  Cymbalta, Risperdal   Hyperlipidemia On Crestor   Hypothyroidism On levothyroxine  Left arm laceration Sutures removed here.  Wash with warm soap and water, change simple dressing daily.              The [facility] Controlled Substances Registry was reviewed for this patient prior to discharge.   Consultants: None Disposition: Skilled nursing facility Diet recommendation: Regular   DISCHARGE MEDICATION: Allergies as of 12/12/2022       Reactions   Nsaids Other (See Comments)   Unknown reaction - listed on [facility] 01/13/22   Penicillins Other (See Comments)   Has patient had a PCN reaction causing immediate rash, facial/tongue/throat swelling, SOB or lightheadedness with hypotension: no throat swelling or SOB Has patient had a PCN reaction causing severe rash involving mucus membranes or skin necrosis: unknown, patients states she had sores on her mouth Has patient had a PCN reaction that required hospitalization: no Has patient had a PCN reaction occurring within the last 10 years: no If all of the above answers are "NO", then may proceed with Cephalosp   Septra [sulfamethoxazole-trimethoprim] Other (See Comments)   Unknown reaction - listed on [facility] 01/13/2022        Medication List     STOP taking these medications    furosemide 20 MG tablet Commonly known as: LASIX       TAKE these medications    acetaminophen 500 MG tablet Commonly known as: TYLENOL Take 1,000 mg by mouth in the morning and at bedtime.   buPROPion 300  MG 24 hr tablet Commonly known as: WELLBUTRIN XL 300 mg every morning. Take one tablet by mouth once daily for depression   CoQ10 200 MG Caps Take 200 mg by mouth every morning.   Cranberry 425 MG Caps Take 425 mg by mouth daily.   diclofenac Sodium 1 % Gel Commonly known as: VOLTAREN Apply 4 g topically 4 (four) times daily. What changed:  when to take this reasons to take this   DULoxetine 60 MG capsule Commonly known as:  CYMBALTA 60 mg every morning.   famotidine 20 MG tablet Commonly known as: PEPCID Take 20 mg by mouth 2 (two) times daily.   ferrous sulfate 325 (65 FE) MG tablet Take 325 mg by mouth every morning.   guaiFENesin 600 MG 12 hr tablet Commonly known as: MUCINEX Take 600 mg by mouth 2 (two) times daily as needed for cough or to loosen phlegm.   levothyroxine 100 MCG tablet Commonly known as: SYNTHROID Take 100 mcg by mouth every morning.   Loperamide HCl 1 MG/7.5ML Liqd Take 2 mg by mouth every 6 (six) hours as needed (diarrhea).   metoprolol succinate 100 MG 24 hr tablet Commonly known as: TOPROL-XL Take 100 mg by mouth every morning.   multivitamin with minerals Tabs tablet Take 1 tablet by mouth every morning. Centrum Silver Women   neomycin-bacitracin-polymyxin [phone] ointment Apply 1 Application topically every other day. Apply to skin tear to left arm   psyllium 0.52 g capsule Commonly known as: REGULOID Take 0.52 g by mouth at bedtime.   raloxifene 60 MG tablet Commonly known as: EVISTA Take 60 mg by mouth every morning.   risperiDONE 0.25 MG tablet Commonly known as: RISPERDAL Take 0.25 mg by mouth at bedtime.   rosuvastatin 10 MG tablet Commonly known as: CRESTOR Take 10 mg by mouth every Friday.   senna-docusate 8.6-50 MG tablet Commonly known as: Senokot-S Take 1 tablet by mouth 2 (two) times daily.   SIMETHICONE PO Take 2 tablets by mouth every 12 (twelve) hours as needed (heartburn/indigestion). Chewable - unknown strength   solifenacin 10 MG tablet Commonly known as: VESICARE Take 10 mg by mouth daily.   traMADol 50 MG tablet Commonly known as: ULTRAM Take 1 tablet (50 mg total) by mouth every 6 (six) hours as needed for moderate pain.        Contact information for follow-up providers     [facility]. Schedule an appointment as soon as possible for a visit in 2 week(s).   Specialty:  Neurosurgery Contact information: [address] [place] [address] [phone]              Contact information for after-discharge care     Destination     [facility], [place] Preferred SNF .   Service: Skilled Nursing Contact information: [address] [place] [address] [phone]                     Discharge Instructions     Increase activity slowly   Complete by: As directed        Discharge Exam: Filed Weights   12/11/22 0034 12/11/22 0456  Weight: 62.2 kg 62.2 kg    General: Pt is alert, awake, not in acute distress, sitting in recliner Cardiovascular: RRR, nl S1-S2, no murmurs appreciated.   No LE edema.   Respiratory: Normal respiratory rate and rhythm.  CTAB without rales or wheezes. Abdominal:  Abdomen soft and non-tender.  No distension or HSM.   Neuro/Psych: Strength symmetric in upper and lower extremities, generalized weakness.  Judgment and insight appear at baseline.   Condition at discharge: stable  The results of significant diagnostics from this hospitalization (including imaging, microbiology, ancillary and laboratory) are listed below for reference.   Imaging Studies: ECHOCARDIOGRAM COMPLETE  Result Date: 12/11/2022    ECHOCARDIOGRAM REPORT   Patient Name:   DEJAI SCHUBACH Date of Exam: 12/11/2022 Medical Rec #:  [id number]      Height:       63.0 in Accession #:    [id number]     Weight:       137.1 lb Date of Birth:  [birth year]      BSA:          1.647 m Patient Age:    89 years       BP:           151/56 mmHg Patient Gender: F              HR:           55 bpm. Exam Location:  Inpatient Procedure: 2D Echo, Cardiac Doppler and Color Doppler Indications:    syncope  History:        Patient has prior history of Echocardiogram examinations.                 Arrythmias:Atrial Fibrillation; Risk Factors:Hypertension.  Sonographer:    Mike Gip Referring Phys: John Giovanni  IMPRESSIONS  1. Left ventricular ejection fraction, by estimation, is 60 to 65%. The left ventricle has normal function. The left ventricle has no regional wall motion abnormalities. There is mild concentric left ventricular hypertrophy. Left ventricular diastolic function could not be evaluated.  2. Right ventricular systolic function is normal. The right ventricular size is normal. There is normal pulmonary artery systolic pressure.  3. The mitral valve is degenerative. No evidence of mitral valve regurgitation. Moderate mitral annular calcification.  4. The aortic valve is calcified. Aortic valve regurgitation is not visualized. Aortic valve sclerosis/calcification is present, without any evidence of aortic stenosis.  5. The inferior vena cava is normal in size with greater than 50% respiratory variability, suggesting right atrial pressure of 3 mmHg. Comparison(s): No prior Echocardiogram. FINDINGS  Left Ventricle: Left ventricular ejection fraction, by estimation, is 60 to 65%. The left ventricle has normal function. The left ventricle has no regional wall motion abnormalities. The left ventricular internal cavity size was normal in size. There is  mild concentric left ventricular hypertrophy. Left ventricular diastolic function could not be evaluated due to mitral annular calcification (moderate or greater). Left ventricular diastolic function could not be evaluated. Right Ventricle: The right ventricular size is normal. No increase in right ventricular wall thickness. Right ventricular systolic function is normal. There is normal pulmonary artery systolic pressure. The tricuspid regurgitant velocity is 1.95 m/s, and  with an assumed right atrial pressure of 3 mmHg, the estimated right ventricular systolic pressure is 18.2 mmHg. Left Atrium: Left atrial size was normal in size. Right Atrium: Right atrial size was normal in size. Pericardium: There is no evidence of pericardial effusion. Mitral Valve: The mitral  valve is degenerative in appearance. Moderate mitral annular calcification. No evidence of mitral valve regurgitation. Tricuspid Valve: The tricuspid valve is normal in structure. Tricuspid valve regurgitation is trivial. No evidence of tricuspid stenosis. Aortic Valve: The aortic valve is calcified. Aortic valve regurgitation is not visualized. Aortic valve  sclerosis/calcification is present, without any evidence of aortic stenosis. Pulmonic Valve: The pulmonic valve was not well visualized. Pulmonic valve regurgitation is not visualized. No evidence of pulmonic stenosis. Aorta: The aortic root and ascending aorta are structurally normal, with no evidence of dilitation. Venous: The inferior vena cava is normal in size with greater than 50% respiratory variability, suggesting right atrial pressure of 3 mmHg. IAS/Shunts: No atrial level shunt detected by color flow Doppler.  LEFT VENTRICLE PLAX 2D LVIDd:         4.00 cm     Diastology LVIDs:         2.10 cm     LV e' medial:    5.98 cm/s LV PW:         1.00 cm     LV E/e' medial:  13.4 LV IVS:        1.10 cm     LV e' lateral:   5.87 cm/s LVOT diam:     1.90 cm     LV E/e' lateral: 13.7 LV SV:         63 LV SV Index:   38 LVOT Area:     2.84 cm  LV Volumes (MOD) LV vol d, MOD A2C: 56.8 ml LV vol d, MOD A4C: 60.9 ml LV vol s, MOD A2C: 19.2 ml LV vol s, MOD A4C: 21.2 ml LV SV MOD A2C:     37.6 ml LV SV MOD A4C:     60.9 ml LV SV MOD BP:      38.7 ml RIGHT VENTRICLE             IVC RV Basal diam:  3.50 cm     IVC diam: 1.50 cm RV S prime:     14.00 cm/s TAPSE (M-mode): 2.2 cm LEFT ATRIUM             Index        RIGHT ATRIUM           Index LA diam:        3.20 cm 1.94 cm/m   RA Area:     11.50 cm LA Vol (A2C):   42.8 ml 25.99 ml/m  RA Volume:   21.40 ml  12.99 ml/m LA Vol (A4C):   50.7 ml 30.78 ml/m LA Biplane Vol: 48.4 ml 29.39 ml/m  AORTIC VALVE LVOT Vmax:   82.00 cm/s LVOT Vmean:  55.400 cm/s LVOT VTI:    0.221 m  AORTA Ao Root diam: 2.90 cm Ao Asc diam:   3.00 cm MITRAL VALVE                TRICUSPID VALVE MV Area (PHT): 2.38 cm     TR Peak grad:   15.2 mmHg MV Decel Time: 319 msec     TR Vmax:        195.00 cm/s MV E velocity: 80.30 cm/s MV A velocity: 113.00 cm/s  SHUNTS MV E/A ratio:  0.71         Systemic VTI:  0.22 m                             Systemic Diam: 1.90 cm Riley Lam MD Electronically signed by Riley Lam MD Signature Date/Time: 12/11/2022/1:06:38 PM    Final    DG Chest 1 View  Result Date: 12/10/2022 CLINICAL DATA:  Increasing weakness EXAM: PORTABLE CHEST 1 VIEW COMPARISON:  12/09/2022 FINDINGS: Cardiac shadow is stable.  Lungs are clear bilaterally. No focal infiltrate or effusion is seen. IMPRESSION: No active disease. Electronically Signed   By: Alcide Clever M.D.   On: 12/10/2022 21:58   CT HEAD WO CONTRAST ( )  Result Date: 12/10/2022 CLINICAL DATA:  Altered mental status EXAM: CT HEAD WITHOUT CONTRAST TECHNIQUE: Contiguous axial images were obtained from the base of the skull through the vertex without intravenous contrast. RADIATION DOSE REDUCTION: This exam was performed according to the departmental dose-optimization program which includes automated exposure control, adjustment of the mA and/or kV according to patient size and/or use of iterative reconstruction technique. COMPARISON:  Head CT 12/10/2022 FINDINGS: Brain: The left convexity subdural hematoma is only minimally visible today. This is likely due to redistribution. No new site of hemorrhage. Unchanged volume loss. There is periventricular hypoattenuation compatible with chronic microvascular disease. Vascular: No abnormal hyperdensity of the major intracranial arteries or dural venous sinuses. No intracranial atherosclerosis. Skull: Left frontal scalp hematoma Sinuses/Orbits: No fluid levels or advanced mucosal thickening of the visualized paranasal sinuses. No mastoid or middle ear effusion. The orbits are normal. IMPRESSION: 1. The left convexity  subdural hematoma is only minimally visible today. This is likely due to redistribution. No new site of hemorrhage. 2. Left frontal scalp hematoma. Electronically Signed   By: Deatra Robinson M.D.   On: 12/10/2022 19:38   CT HEAD WO CONTRAST ( )  Result Date: 12/10/2022 CLINICAL DATA:  Head trauma EXAM: CT HEAD WITHOUT CONTRAST TECHNIQUE: Contiguous axial images were obtained from the base of the skull through the vertex without intravenous contrast. RADIATION DOSE REDUCTION: This exam was performed according to the departmental dose-optimization program which includes automated exposure control, adjustment of the mA and/or kV according to patient size and/or use of iterative reconstruction technique. COMPARISON:  12/09/2022 FINDINGS: Brain: Unchanged size of 5 mm left convexity subdural hematoma. No new site of hemorrhage. No midline shift or other mass effect. There is periventricular hypoattenuation compatible with chronic microvascular disease. Mild volume loss. Vascular: Atherosclerotic calcification of the major vessels at the skull base. Skull: No fracture.  Left frontal scalp hematoma. Sinuses/Orbits: No acute finding. Other: None. IMPRESSION: 1. Unchanged size of 5 mm left convexity subdural hematoma. No new site of hemorrhage. 2. Left frontal scalp hematoma without skull fracture. Electronically Signed   By: Deatra Robinson M.D.   On: 12/10/2022 03:46   CT HEAD WO CONTRAST ( )  Result Date: 12/09/2022 CLINICAL DATA:  Head trauma, intracranial arterial injury suspected; Ataxia, cervical trauma. EXAM: CT HEAD WITHOUT CONTRAST CT CERVICAL SPINE WITHOUT CONTRAST TECHNIQUE: Multidetector CT imaging of the head and cervical spine was performed following the standard protocol without intravenous contrast. Multiplanar CT image reconstructions of the cervical spine were also generated. RADIATION DOSE REDUCTION: This exam was performed according to the departmental dose-optimization program which includes  automated exposure control, adjustment of the mA and/or kV according to patient size and/or use of iterative reconstruction technique. COMPARISON:  CT head and cervical spine 12/03/2022. FINDINGS: CT HEAD FINDINGS Brain: Small, acute left convexity subdural hematoma measuring up to 5 mm in thickness (coronal image 39 series 5). No significant mass effect or midline shift. Cortical gray-white differentiation is preserved. No hydrocephalus. Vascular: No hyperdense vessel or unexpected calcification. Skull: No calvarial fracture or suspicious bone lesion. Skull base is unremarkable. Sinuses/Orbits: Unremarkable. Other: Small left frontal scalp hematoma. CT CERVICAL SPINE FINDINGS Alignment: Unchanged 3 mm degenerative anterolisthesis of C3 on C4 and C4 on C5. No traumatic malalignment. Skull base and vertebrae: No  acute fracture. Normal craniocervical junction. No suspicious bone lesions. Soft tissues and spinal canal: No prevertebral fluid or swelling. No visible canal hematoma. Disc levels: Multilevel cervical spondylosis, worst at C6-7, where there is at least mild spinal canal stenosis. Upper chest: Unremarkable. Other: Atherosclerotic calcifications of the carotid bulbs. IMPRESSION: 1. Small, acute left convexity subdural hematoma measuring up to 5 mm in thickness. No significant mass effect or midline shift. 2. Small left frontal scalp hematoma. No calvarial fracture. 3. No acute fracture or traumatic malalignment of the cervical spine. Critical Value/emergent results were called by telephone at the time of interpretation on 12/09/2022 at 9:25 pm to provider DAVID YAO , who verbally acknowledged these results. Electronically Signed   By: Orvan Falconer M.D.   On: 12/09/2022 21:31   CT Cervical Spine Wo Contrast  Result Date: 12/09/2022 CLINICAL DATA:  Head trauma, intracranial arterial injury suspected; Ataxia, cervical trauma. EXAM: CT HEAD WITHOUT CONTRAST CT CERVICAL SPINE WITHOUT CONTRAST TECHNIQUE:  Multidetector CT imaging of the head and cervical spine was performed following the standard protocol without intravenous contrast. Multiplanar CT image reconstructions of the cervical spine were also generated. RADIATION DOSE REDUCTION: This exam was performed according to the departmental dose-optimization program which includes automated exposure control, adjustment of the mA and/or kV according to patient size and/or use of iterative reconstruction technique. COMPARISON:  CT head and cervical spine 12/03/2022. FINDINGS: CT HEAD FINDINGS Brain: Small, acute left convexity subdural hematoma measuring up to 5 mm in thickness (coronal image 39 series 5). No significant mass effect or midline shift. Cortical gray-white differentiation is preserved. No hydrocephalus. Vascular: No hyperdense vessel or unexpected calcification. Skull: No calvarial fracture or suspicious bone lesion. Skull base is unremarkable. Sinuses/Orbits: Unremarkable. Other: Small left frontal scalp hematoma. CT CERVICAL SPINE FINDINGS Alignment: Unchanged 3 mm degenerative anterolisthesis of C3 on C4 and C4 on C5. No traumatic malalignment. Skull base and vertebrae: No acute fracture. Normal craniocervical junction. No suspicious bone lesions. Soft tissues and spinal canal: No prevertebral fluid or swelling. No visible canal hematoma. Disc levels: Multilevel cervical spondylosis, worst at C6-7, where there is at least mild spinal canal stenosis. Upper chest: Unremarkable. Other: Atherosclerotic calcifications of the carotid bulbs. IMPRESSION: 1. Small, acute left convexity subdural hematoma measuring up to 5 mm in thickness. No significant mass effect or midline shift. 2. Small left frontal scalp hematoma. No calvarial fracture. 3. No acute fracture or traumatic malalignment of the cervical spine. Critical Value/emergent results were called by telephone at the time of interpretation on 12/09/2022 at 9:25 pm to provider DAVID YAO , who verbally  acknowledged these results. Electronically Signed   By: Orvan Falconer M.D.   On: 12/09/2022 21:31   DG Pelvis Portable  Result Date: 12/09/2022 CLINICAL DATA:  Recent fall with pelvic pain, initial encounter EXAM: PORTABLE PELVIS 1 VIEWS COMPARISON:  01/14/2022 FINDINGS: Left hip prosthesis is again noted. Postsurgical changes are noted in the pelvis. Pelvic ring is intact. No acute fracture or dislocation is noted. No soft tissue changes are seen. IMPRESSION: Acute abnormality noted. Electronically Signed   By: Alcide Clever M.D.   On: 12/09/2022 21:12   DG Chest Port 1 View  Result Date: 12/09/2022 CLINICAL DATA:  Recent fall EXAM: PORTABLE CHEST 1 VIEW COMPARISON:  01/13/2022 FINDINGS: Cardiac shadow is within normal limits. Lungs are hypoinflated bilaterally. No focal infiltrate or sizable effusion is seen. No acute bony abnormality is noted. IMPRESSION: No acute abnormality seen. Electronically Signed   By: Loraine Leriche  Lukens M.D.   On: 12/09/2022 21:11   CT Head Wo Contrast  Result Date: 12/03/2022 CLINICAL DATA:  Status post fall. EXAM: CT HEAD WITHOUT CONTRAST CT CERVICAL SPINE WITHOUT CONTRAST TECHNIQUE: Multidetector CT imaging of the head and cervical spine was performed following the standard protocol without intravenous contrast. Multiplanar CT image reconstructions of the cervical spine were also generated. RADIATION DOSE REDUCTION: This exam was performed according to the departmental dose-optimization program which includes automated exposure control, adjustment of the mA and/or kV according to patient size and/or use of iterative reconstruction technique. COMPARISON:  01/14/2022 FINDINGS: CT HEAD FINDINGS Brain: There is no evidence for acute hemorrhage, hydrocephalus, mass lesion, or abnormal extra-axial fluid collection. No definite CT evidence for acute infarction. Diffuse loss of parenchymal volume is consistent with atrophy. Patchy low attenuation in the deep hemispheric and  periventricular white matter is nonspecific, but likely reflects chronic microvascular ischemic demyelination. Vascular: No hyperdense vessel or unexpected calcification. Skull: No evidence for fracture. No worrisome lytic or sclerotic lesion. Sinuses/Orbits: The visualized paranasal sinuses and mastoid air cells are clear. Visualized portions of the globes and intraorbital fat are unremarkable. Other: None. CT CERVICAL SPINE FINDINGS Alignment: Degenerative anterolisthesis noted at C3 on 4 and C4 on 5, stable in the interval. Straightening of normal cervical lordosis evident. Skull base and vertebrae: No acute fracture. No primary bone lesion or focal pathologic process. Soft tissues and spinal canal: No prevertebral fluid or swelling. No visible canal hematoma. Disc levels: Loss of disc height with endplate degeneration noted C5-6 and C6-7. Diffuse facet osteoarthritis noted bilaterally. Upper chest: Unremarkable. Other: None. IMPRESSION: 1. No acute intracranial abnormality. 2. Atrophy with chronic small vessel ischemic disease. 3. No evidence for cervical spine fracture or traumatic subluxation. 4. Degenerative changes in the cervical spine as above. Electronically Signed   By: Kennith Center M.D.   On: 12/03/2022 06:40   CT Cervical Spine Wo Contrast  Result Date: 12/03/2022 CLINICAL DATA:  Status post fall. EXAM: CT HEAD WITHOUT CONTRAST CT CERVICAL SPINE WITHOUT CONTRAST TECHNIQUE: Multidetector CT imaging of the head and cervical spine was performed following the standard protocol without intravenous contrast. Multiplanar CT image reconstructions of the cervical spine were also generated. RADIATION DOSE REDUCTION: This exam was performed according to the departmental dose-optimization program which includes automated exposure control, adjustment of the mA and/or kV according to patient size and/or use of iterative reconstruction technique. COMPARISON:  01/14/2022 FINDINGS: CT HEAD FINDINGS Brain: There is  no evidence for acute hemorrhage, hydrocephalus, mass lesion, or abnormal extra-axial fluid collection. No definite CT evidence for acute infarction. Diffuse loss of parenchymal volume is consistent with atrophy. Patchy low attenuation in the deep hemispheric and periventricular white matter is nonspecific, but likely reflects chronic microvascular ischemic demyelination. Vascular: No hyperdense vessel or unexpected calcification. Skull: No evidence for fracture. No worrisome lytic or sclerotic lesion. Sinuses/Orbits: The visualized paranasal sinuses and mastoid air cells are clear. Visualized portions of the globes and intraorbital fat are unremarkable. Other: None. CT CERVICAL SPINE FINDINGS Alignment: Degenerative anterolisthesis noted at C3 on 4 and C4 on 5, stable in the interval. Straightening of normal cervical lordosis evident. Skull base and vertebrae: No acute fracture. No primary bone lesion or focal pathologic process. Soft tissues and spinal canal: No prevertebral fluid or swelling. No visible canal hematoma. Disc levels: Loss of disc height with endplate degeneration noted C5-6 and C6-7. Diffuse facet osteoarthritis noted bilaterally. Upper chest: Unremarkable. Other: None. IMPRESSION: 1. No acute intracranial abnormality. 2.  Atrophy with chronic small vessel ischemic disease. 3. No evidence for cervical spine fracture or traumatic subluxation. 4. Degenerative changes in the cervical spine as above. Electronically Signed   By: Kennith Center M.D.   On: 12/03/2022 06:40   DG Forearm Left  Result Date: 12/03/2022 CLINICAL DATA:  Laceration after fall EXAM: LEFT FOREARM - 2 VIEW COMPARISON:  None Available. FINDINGS: Skin defect at the mid forearm correlating with the history. No opaque foreign body, fracture, or dislocation. Subjective osteopenia. IMPRESSION: No fracture or opaque foreign body at the forearm wound. Electronically Signed   By: Tiburcio Pea M.D.   On: 12/03/2022 04:28     Microbiology: Results for orders placed or performed during the hospital encounter of 12/03/22  Urine Culture     Status: Abnormal   Collection Time: 12/03/22  8:40 AM   Specimen: Urine, Random  Result Value Ref Range Status   Specimen Description   Final    URINE, RANDOM Performed at [facility], [address], [place], [place] [address]    Special Requests   Final    NONE Reflexed from [id number] Performed at [facility], [address] [place], [place] [address]    Culture MULTIPLE SPECIES PRESENT, SUGGEST RECOLLECTION (A)  Final   Report Status 12/04/2022 FINAL  Final    Labs: CBC: Recent Labs  Lab 12/09/22 2221 12/10/22 1810  WBC 9.3 9.4  NEUTROABS 6.6 5.6  HGB 12.5 12.1  HCT 38.1 37.5  MCV 101.6* 101.6*  PLT 203 227   Basic Metabolic Panel: Recent Labs  Lab 12/09/22 2221 12/10/22 1810 12/11/22 0441  NA 136 138 138  K 4.2 4.2 4.2  CL 99 99 104  CO2 25 28 23   GLUCOSE 135* 109* 94  BUN 24* 22 20  CREATININE 1.29* 1.49* 1.20*  CALCIUM 9.2 9.6 9.1   Liver Function Tests: Recent Labs  Lab 12/09/22 2221 12/10/22 1810  AST 39 36  ALT 35 35  ALKPHOS 45 46  BILITOT 0.6 0.5  PROT 6.5 7.0  ALBUMIN 3.4* 3.6   CBG: No results for input(s): "GLUCAP" in the last 168 hours.  Discharge time spent: approximately 35 minutes spent on discharge counseling, evaluation of patient on day of discharge, and coordination of discharge planning with nursing, social work, pharmacy and case management  Signed: Alberteen Sam, MD [facility] 12/12/2022

## 2022-12-12 NOTE — TOC Transition Note (Signed)
Transition of Care [facility]) - CM/SW Discharge Note   Patient Details  Name: Michelle Jones MRN: [id number] Date of Birth: 06/25/1930  Transition of Care [facility] CM/SW Contact:  Delilah Shan, LCSWA Phone Number: 12/12/2022, 1:45 PM   Clinical Narrative:     Patient will DC to: [facility]  Anticipated DC date: 12/12/2022  Family notified: Gery Pray  Transport by: Sharin Mons  ?  Per MD patient ready for DC to [facility]. RN, patient, patient's family, and facility notified of DC. Discharge Summary sent to facility. RN given number for report tele#[phone] RM# [phone] . DC packet on chart.DNR signed by MD attached to patients DC packet. Ambulance transport requested for patient.  CSW signing off.   Final next level of care: Skilled Nursing Facility Barriers to Discharge: No Barriers Identified   Patient Goals and CMS Choice CMS Medicare.gov Compare Post Acute Care list provided to:: Patient Represenative (must comment) (Patients Son) Choice offered to / list presented to : Adult Children (Patients Son)  Discharge Placement                Patient chooses bed at: [facility] [place] Patient to be transferred to facility by: [facility] Name of family member notified: Gery Pray Patient and family notified of of transfer: 12/12/22  Discharge Plan and Services Additional resources added to the After Visit Summary for   In-house Referral: Clinical Social Work                                   Social Determinants of Health (SDOH) Interventions SDOH Screenings   Tobacco Use: Low Risk  (12/10/2022)     Readmission Risk Interventions     No data to display

## 2022-12-12 NOTE — Progress Notes (Signed)
Occupational Therapy Treatment Patient Details Name: Michelle Jones MRN: [id number] DOB: [birth year] Today's Date: 12/12/2022   History of present illness 89 y.o. female recently seen in the ED on 4/23 following a fall and CT of head/C-spine negative for acute traumatic injuries at that time and pt discharged back to facility after ~ 7 hours. Pt returned 4/29 after fall at facility, head CT showed small acute left convexity subdural hematoma measuring up to 5 mm in thickness with no significant mass effect or midline shift. Repeat CT showed hematoma stable and was discharged back to facility after ~9 hours in ED. Patient returns to the ED 4/30 from her ALF due to concern for AMS, increased lethargy, and difficulty walking. On repeat CT head today, the left convexity subdural hematoma is only minimally visible and no new site of hemorrhage. PMH significant for GERD, HTN, Lt THA.   OT comments  Pt making good progress with functional goals. Pt seated in recliner upon arrival with son present. Pt able to stand from recliner and SPT mod A using RW with mod verbal cues for correct hand placement.safety. Pt able to simulate UB bathing tasks seated min A. OT will continue to follow acutely to maximize level of function and safety   Recommendations for follow up therapy are one component of a multi-disciplinary discharge planning process, led by the attending physician.  Recommendations may be updated based on patient status, additional functional criteria and insurance authorization.    Assistance Recommended at Discharge Frequent or constant Supervision/Assistance  Patient can return home with the following  A little help with walking and/or transfers;A lot of help with bathing/dressing/bathroom;Assist for transportation   Equipment Recommendations  BSC/3in1    Recommendations for Other Services      Precautions / Restrictions Precautions Precautions: Fall Precaution Comments: check  BP Restrictions Weight Bearing Restrictions: No       Mobility Bed Mobility               General bed mobility comments: pt in recliner upon arrival    Transfers Overall transfer level: Needs assistance Equipment used: Rolling walker (2 wheels) Transfers: Sit to/from Stand Sit to Stand: Mod assist     Step pivot transfers: Mod assist     General transfer comment: mod verbal cues for correct hand placement     Balance Overall balance assessment: Needs assistance Sitting-balance support: Feet supported, Bilateral upper extremity supported Sitting balance-Leahy Scale: Fair     Standing balance support: Bilateral upper extremity supported, During functional activity, Reliant on assistive device for balance Standing balance-Leahy Scale: Poor                             ADL either performed or assessed with clinical judgement   ADL Overall ADL's : Needs assistance/impaired     Grooming: Wash/dry hands;Wash/dry face;Set up;Supervision/safety;Sitting   Upper Body Bathing: Minimal assistance;Sitting Upper Body Bathing Details (indicate cue type and reason): simulated Lower Body Bathing: Maximal assistance Lower Body Bathing Details (indicate cue type and reason): simulated Upper Body Dressing : Minimal assistance       Toilet Transfer: Moderate assistance;BSC/3in1;Rolling walker (2 wheels);Cueing for safety   Toileting- Clothing Manipulation and Hygiene: Moderate assistance;Sit to/from stand       Functional mobility during ADLs: Moderate assistance;Rolling walker (2 wheels);Cueing for safety      Extremity/Trunk Assessment Upper Extremity Assessment Upper Extremity Assessment: Generalized weakness;RUE deficits/detail RUE Deficits / Details: AROM WFL 5th  digit contracture- son reports hx of tendon repair with poor splint wear LUE Deficits / Details: AROM WFL 5th digit contracture   Lower Extremity Assessment Lower Extremity Assessment: Defer  to PT evaluation   Cervical / Trunk Assessment Cervical / Trunk Assessment: Kyphotic    Vision Baseline Vision/History: 1 Wears glasses Ability to See in Adequate Light: 0 Adequate Patient Visual Report: No change from baseline     Perception     Praxis      Cognition Arousal/Alertness: Awake/alert Behavior During Therapy: WFL for tasks assessed/performed Overall Cognitive Status: History of cognitive impairments - at baseline                                 General Comments: Slow processing, some carryover of instruction for safe UE placement during session but needs dense multimodal cues for safety at times. Pt benefits from errorless learning strategy given cognitive deficit and recalls 25-50% of safety cues within session. Pleasantly cooperative.        Exercises      Shoulder Instructions       General Comments L UE forearm with wounds s/p x2 falls. prior stitches required, son asking about if they will be removed tomorrow, RN/MD notified of his question and also that pt was orthostatic    Pertinent Vitals/ Pain       Pain Assessment Pain Assessment: No/denies pain Faces Pain Scale: No hurt Pain Intervention(s): Monitored during session, Repositioned  Home Living                                          Prior Functioning/Environment              Frequency  Min 1X/week        Progress Toward Goals  OT Goals(current goals can now be found in the care plan section)  Progress towards OT goals: Progressing toward goals     Plan Discharge plan remains appropriate    Co-evaluation                 AM-PAC OT "6 Clicks" Daily Activity     Outcome Measure   Help from another person eating meals?: None Help from another person taking care of personal grooming?: A Little Help from another person toileting, which includes using toliet, bedpan, or urinal?: A Little Help from another person bathing (including  washing, rinsing, drying)?: A Lot Help from another person to put on and taking off regular upper body clothing?: A Little Help from another person to put on and taking off regular lower body clothing?: A Lot 6 Click Score: 17    End of Session Equipment Utilized During Treatment: Gait belt;Rolling walker (2 wheels)  OT Visit Diagnosis: Unsteadiness on feet (R26.81);Muscle weakness (generalized) (M62.81);Repeated falls (R29.6)   Activity Tolerance Patient tolerated treatment well   Patient Left in chair;with call bell/phone within reach;with family/visitor present   Nurse Communication          Time: 4098-1191 OT Time Calculation (min): 17 min  Charges: OT General Charges $OT Visit: 1 Visit OT Treatments $Self Care/Home Management : 8-22 mins  Galen Manila 12/12/2022, 1:10 PM

## 2022-12-12 NOTE — TOC Progression Note (Addendum)
Transition of Care [facility]) - Progression Note    Patient Details  Name: Michelle Jones MRN: [id number] Date of Birth: 13-Mar-1930  Transition of Care [facility]) CM/SW Contact  Delilah Shan, LCSWA Phone Number: 12/12/2022, 11:43 AM  Clinical Narrative:     Update- CSW received message from Radisson with [facility] who informed CSW they can make SNF bed offer for patient. CSW spoke with patients son Gery Pray and provided SNF bed offers. Patients son accepted SNF bed offer with [facility]. CSW added facility choice to patients insurance authorization. Patients insurance authorization has been approved. Auth ID# [id number]. Insurance authorization has been approved from 5/1-5/3. French Ana with [facility] confirmed patient can dc over today if medically ready.CSW informed MD.    CSW spoke with French Ana with [facility] who request for current note from PT before determination on if they can offer SNF bed for patient. CSW informed PT. PT plans to see patient shortly. CSW informed facility. CSW will continue to follow.  Expected Discharge Plan: Skilled Nursing Facility Barriers to Discharge: Continued Medical Work up  Expected Discharge Plan and Services In-house Referral: Clinical Social Work     Living arrangements for the past 2 months: Skilled Nursing Facility ([facility])                                       Social Determinants of Health (SDOH) Interventions SDOH Screenings   Tobacco Use: Low Risk  (12/10/2022)    Readmission Risk Interventions     No data to display

## 2023-12-03 ENCOUNTER — Emergency Department (HOSPITAL_COMMUNITY)

## 2023-12-03 ENCOUNTER — Other Ambulatory Visit: Payer: Self-pay

## 2023-12-03 ENCOUNTER — Emergency Department (HOSPITAL_COMMUNITY)
Admission: EM | Admit: 2023-12-03 | Discharge: 2023-12-03 | Disposition: A | Discharge status: Home / Self Care | Attending: Emergency Medicine | Admitting: Emergency Medicine

## 2023-12-03 DIAGNOSIS — S0003XA Contusion of scalp, initial encounter: Secondary | ICD-10-CM | POA: Insufficient documentation

## 2023-12-03 DIAGNOSIS — S0990XA Unspecified injury of head, initial encounter: Secondary | ICD-10-CM | POA: Diagnosis present

## 2023-12-03 DIAGNOSIS — I10 Essential (primary) hypertension: Secondary | ICD-10-CM | POA: Insufficient documentation

## 2023-12-03 DIAGNOSIS — W01198A Fall on same level from slipping, tripping and stumbling with subsequent striking against other object, initial encounter: Secondary | ICD-10-CM | POA: Insufficient documentation

## 2023-12-03 DIAGNOSIS — F0390 Unspecified dementia without behavioral disturbance: Secondary | ICD-10-CM | POA: Diagnosis not present

## 2023-12-03 DIAGNOSIS — W19XXXA Unspecified fall, initial encounter: Secondary | ICD-10-CM

## 2023-12-03 DIAGNOSIS — Z79899 Other long term (current) drug therapy: Secondary | ICD-10-CM | POA: Insufficient documentation

## 2023-12-03 NOTE — ED Triage Notes (Signed)
 Pt BIB EMS from [facility]. Pt stood to get roll aider and shoes were slick and fell backwards striking back of head. Denies LOC. Denies Thinners.   EMS VS  152/80 100 Pulse 98% RA

## 2023-12-03 NOTE — ED Provider Triage Note (Signed)
 Emergency Medicine Provider Triage Evaluation Note  Michelle Jones , a 89 y.o. female  was evaluated in triage.  Pt complains of head pain following a fall.  Patient came in from [facility].  Patient apparently got hurt shoe caught up when trying to get out of the roller rater and it was slick and she fell backwards striking the back of her head no loss of consciousness patient denies being on blood thinners.  Denies any upper extremity lower extremity pain.  Denies any upper or lower back pain.  Review of Systems  Positive: Head pain Negative: Upper or lower back pain or any extremity pain  Physical Exam  BP (!) 158/77 (BP Location: Right Arm)   Pulse 88   Temp 97.8 F (36.6 C) (Oral)   Resp 20   Ht 1.6 m (5\' 3" )   Wt 62.2 kg   SpO2 98%   BMI 24.29 kg/m  Gen: Awake, no distress   Resp: Normal effort chest nontender MSK:  Moves extremities without difficulty no deformity Other: Abdomen soft nontender palpation to the back nontender. Neurowise patient is alert and oriented but there may be some degree of some confusion.  Past medical history significant just for hypertension and gastroesophageal reflux disease.  Medical Decision Making  Medically screening exam initiated at 2:40 PM.  Appropriate orders placed.  Michelle Jones was informed that the remainder of the evaluation will be completed by another provider, this initial triage assessment does not replace that evaluation, and the importance of remaining in the ED until their evaluation is complete.  Will get CT head and neck.  Patient seems to be moving all extremities well.   Rekha Hobbins, MD 12/03/23 1447

## 2023-12-03 NOTE — ED Notes (Signed)
 Patient ambulated in hallway with walker and no assistance. Patient steady on feet and with turns.

## 2023-12-03 NOTE — ED Notes (Signed)
 Pt to CT

## 2023-12-03 NOTE — ED Provider Notes (Signed)
 [place] EMERGENCY DEPARTMENT AT [facility] Provider Note   CSN: [id number] Arrival date & time: 12/03/23  1420     History  Chief Complaint  Patient presents with   Michelle Jones is a 89 y.o. female.  With a history of hypertension, dementia who presents to the ED after a fall.  Patient is a resident of [facility].  She had gotten dressed up today to go see her urologist and put on her dress shoes with a small heel that her daughter says do not fit her properly.  This caused her to trip and fall backwards.  She did strike the back of her head.  No loss of consciousness.  No anticoagulation or other injuries.  Fall       Home Medications Prior to Admission medications   Medication Sig Start Date End Date Taking? Authorizing Provider  acetaminophen  (TYLENOL ) 500 MG tablet Take 1,000 mg by mouth in the morning and at bedtime.    [provider]  buPROPion  (WELLBUTRIN  XL) 300 MG 24 hr tablet 300 mg every morning. Take one tablet by mouth once daily for depression 10/03/14   [provider]  Coenzyme Q10 (COQ10) 200 MG CAPS Take 200 mg by mouth every morning.    [provider]  Cranberry 425 MG CAPS Take 425 mg by mouth daily.    [provider]  diclofenac  Sodium (VOLTAREN ) 1 % GEL Apply 4 g topically 4 (four) times daily. Patient taking differently: Apply 4 g topically 2 (two) times daily as needed (for painful joint, prior to walking). 09/28/21   Floyd, Dan, DO  DULoxetine  (CYMBALTA ) 60 MG capsule 60 mg every morning. 10/03/14   [provider]  famotidine (PEPCID) 20 MG tablet Take 20 mg by mouth 2 (two) times daily.    [provider]  ferrous sulfate  325 (65 FE) MG tablet Take 325 mg by mouth every morning.    [provider]  guaiFENesin (MUCINEX) 600 MG 12 hr tablet Take 600 mg by mouth 2 (two) times daily as needed for cough or to loosen phlegm.    [provider]   levothyroxine  (SYNTHROID , LEVOTHROID) 100 MCG tablet Take 100 mcg by mouth every morning. 10/22/14   [provider]  Loperamide HCl 1 MG/7.5ML LIQD Take 2 mg by mouth every 6 (six) hours as needed (diarrhea).    [provider]  metoprolol  succinate (TOPROL -XL) 100 MG 24 hr tablet Take 100 mg by mouth every morning. 12/31/21   [provider]  Multiple Vitamin (MULTIVITAMIN WITH MINERALS) TABS tablet Take 1 tablet by mouth every morning. Centrum Silver Women    [provider]  neomycin-bacitracin-polymyxin (NEOSPORIN) [phone] ointment Apply 1 Application topically every other day. Apply to skin tear to left arm    [provider]  psyllium (REGULOID) 0.52 g capsule Take 0.52 g by mouth at bedtime.    [provider]  raloxifene (EVISTA) 60 MG tablet Take 60 mg by mouth every morning. 12/31/21   [provider]  risperiDONE  (RISPERDAL ) 0.25 MG tablet Take 0.25 mg by mouth at bedtime.    [provider]  rosuvastatin  (CRESTOR ) 10 MG tablet Take 10 mg by mouth every Friday.    [provider]  senna-docusate (SENOKOT-S) 8.6-50 MG tablet Take 1 tablet by mouth 2 (two) times daily.    [provider]  SIMETHICONE PO Take 2 tablets by mouth every 12 (twelve) hours as  needed (heartburn/indigestion). Chewable - unknown strength    [provider]  solifenacin (VESICARE) 10 MG tablet Take 10 mg by mouth daily.    [provider]  traMADol  (ULTRAM ) 50 MG tablet Take 1 tablet (50 mg total) by mouth every 6 (six) hours as needed for moderate pain. 12/12/22   Danford, Willis Harter, MD      Allergies    Nsaids, Penicillins, and Septra [sulfamethoxazole-trimethoprim]    Review of Systems   Review of Systems  Physical Exam Updated Vital Signs BP (!) 162/113   Pulse 71   Temp 98.1 F (36.7 C) (Oral)   Resp 16   Ht 5\' 3"  (1.6 m)   Wt 62.2 kg   SpO2 98%   BMI 24.29 kg/m  Physical Exam Vitals  and nursing note reviewed.  HENT:     Head:     Comments: Small hematoma over posterior occiput Eyes:     Pupils: Pupils are equal, round, and reactive to light.  Cardiovascular:     Rate and Rhythm: Normal rate and regular rhythm.  Pulmonary:     Effort: Pulmonary effort is normal.     Breath sounds: Normal breath sounds.  Abdominal:     Palpations: Abdomen is soft.     Tenderness: There is no abdominal tenderness.  Musculoskeletal:        General: No swelling, tenderness, deformity or signs of injury.     Cervical back: Neck supple. No tenderness.  Skin:    General: Skin is warm and dry.  Neurological:     General: No focal deficit present.     Mental Status: She is alert and oriented to person, place, and time.     Sensory: No sensory deficit.     Motor: No weakness.  Psychiatric:        Mood and Affect: Mood normal.     ED Results / Procedures / Treatments   Labs (all labs ordered are listed, but only abnormal results are displayed) Labs Reviewed - No data to display  EKG None  Radiology CT Cervical Spine Wo Contrast Result Date: 12/03/2023 CLINICAL DATA:  Fall.  Trauma. EXAM: CT CERVICAL SPINE WITHOUT CONTRAST TECHNIQUE: Multidetector CT imaging of the cervical spine was performed without intravenous contrast. Multiplanar CT image reconstructions were also generated. RADIATION DOSE REDUCTION: This exam was performed according to the departmental dose-optimization program which includes automated exposure control, adjustment of the mA and/or kV according to patient size and/or use of iterative reconstruction technique. COMPARISON:  CT of the cervical spine 12/09/2022 FINDINGS: Alignment: Grade 1 degenerative anterolisthesis C3-4 and C4-5 is stable. Grade 1 degenerative anterolisthesis is present at T2-3. Skull base and vertebrae: The craniocervical junction is normal. Vertebral body heights are normal in cervical spine. Remote superior endplate fractures at T1 and T2 are  stable. Soft tissues and spinal canal: No prevertebral fluid or swelling. No visible canal hematoma. Atherosclerotic calcifications are present within the carotid bifurcations bilaterally, similar the prior exam. Disc levels: Uncovertebral and facet hypertrophy lead to moderate foraminal narrowing bilaterally at C3-4, right greater than left. Mild bilateral foraminal narrowing is present at C4-5. Moderate foraminal stenosis is present at C5-6 and C6-7. Central canal narrowing is greatest at C5-6. Upper chest: The lung apices are clear. The thoracic inlet is within normal limits. IMPRESSION: 1. No acute fracture or traumatic subluxation. 2. Remote superior endplate fractures at T1 and T2 are stable. 3. Multilevel degenerative changes of the cervical spine as described. Electronically Signed  By: Audree Leas M.D.   On: 12/03/2023 16:48   CT Head Wo Contrast Result Date: 12/03/2023 CLINICAL DATA:  Fall.  Trauma to back of head. EXAM: CT HEAD WITHOUT CONTRAST TECHNIQUE: Contiguous axial images were obtained from the base of the skull through the vertex without intravenous contrast. RADIATION DOSE REDUCTION: This exam was performed according to the departmental dose-optimization program which includes automated exposure control, adjustment of the mA and/or kV according to patient size and/or use of iterative reconstruction technique. COMPARISON:  CT head without contrast 12/10/2022 FINDINGS: Brain: No acute infarct, hemorrhage, or mass lesion is present. Mild atrophy and white matter changes are similar to the prior study. The ventricles are proportionate to the degree of atrophy. Deep brain nuclei are within normal limits. Midline structures are within normal limits. The brainstem and cerebellum are within normal limits. Vascular: Atherosclerotic calcifications are present within the cavernous internal carotid arteries bilaterally and at the dural margin of both vertebral arteries. No hyperdense vessel is  present. Skull: Calvarium is intact. No focal lytic or blastic lesions are present. No significant extracranial soft tissue lesion is present. Sinuses/Orbits: In inferior right mastoid effusion is present. No obstructing nasopharyngeal lesion is present. The paranasal sinuses and mastoid air cells are otherwise clear. Bilateral lens replacements are noted. Globes and orbits are otherwise unremarkable. IMPRESSION: 1. No acute intracranial abnormality or significant interval change. 2. Mild atrophy and white matter disease likely reflects the sequela of chronic microvascular ischemia. 3. Inferior right mastoid effusion. No obstructing nasopharyngeal lesion is present. Electronically Signed   By: Audree Leas M.D.   On: 12/03/2023 16:45    Procedures Procedures    Medications Ordered in ED Medications - No data to display  ED Course/ Medical Decision Making/ A&P                                 Medical Decision Making 89 year old female not on anticoagulation presenting after mechanical fall at nursing facility.  Her shoes did not fit her properly and caused her to trip and fall backwards.  Struck the back of her head but no LOC.  Examination notable for small hematoma over posterior occiput but no other traumatic findings.  CT head and C-spine show no acute injury.  She was remained hemodynamically stable here for several hours and was able to ambulate with a steady gait with her walker which is at her baseline.  Daughter feels she is at her cognitive baseline as well and looks well enough to go home.  We will discharge back to her skilled nursing facility with plan for PCP follow-up  Amount and/or Complexity of Data Reviewed Radiology: ordered.           Final Clinical Impression(s) / ED Diagnoses Final diagnoses:  Injury of head, initial encounter  Fall, initial encounter    Rx / DC Orders ED Discharge Orders     None         Sallyanne Creamer, DO 12/03/23 1946

## 2023-12-03 NOTE — Discharge Instructions (Signed)
 You were seen in the emerged part after a fall The CAT scan of your head and neck looked okay You were able to walk with your walker without issue I would recommend not wearing those shoes again as it is apparent they do not fit you properly.  You are better off in a sturdy tennis shoe with a rubber bottom that is made for walking Return to the emerged part for repeated falls or severe headaches Otherwise follow-up with your primary care doctor in 1 week for reevaluation
